# Patient Record
Sex: Female | Born: 1985 | Race: White | Hispanic: No | Marital: Single
Health system: Northeastern US, Academic
[De-identification: ages and names within clinical notes are randomized; demographics above are authoritative.]

## PROBLEM LIST (undated history)

## (undated) DIAGNOSIS — G5600 Carpal tunnel syndrome, unspecified upper limb: Secondary | ICD-10-CM

## (undated) DIAGNOSIS — J45909 Unspecified asthma, uncomplicated: Secondary | ICD-10-CM

## (undated) DIAGNOSIS — E039 Hypothyroidism, unspecified: Secondary | ICD-10-CM

## (undated) DIAGNOSIS — I7300 Raynaud's syndrome without gangrene: Secondary | ICD-10-CM

## (undated) HISTORY — DX: Raynaud's syndrome without gangrene: I73.00

## (undated) HISTORY — DX: Carpal tunnel syndrome, unspecified upper limb: G56.00

## (undated) HISTORY — DX: Hypothyroidism, unspecified: E03.9

## (undated) HISTORY — DX: Unspecified asthma, uncomplicated: J45.909

## (undated) HISTORY — PX: WISDOM TOOTH EXTRACTION: SHX21

---
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
## 2017-09-30 HISTORY — PX: ANTERIOR CRUCIATE LIGAMENT REPAIR: SHX115

## 2018-02-17 ENCOUNTER — Ambulatory Visit: Admit: 2018-02-17 | Payer: HMO

## 2018-02-17 ENCOUNTER — Ambulatory Visit: Admitting: Dermatology

## 2018-02-17 ENCOUNTER — Ambulatory Visit

## 2018-02-17 ENCOUNTER — Ambulatory Visit (HOSPITAL_BASED_OUTPATIENT_CLINIC_OR_DEPARTMENT_OTHER): Admitting: Psychiatry

## 2018-02-17 MED ORDER — Alclometasone Dipropionate: 0.05 | gms | 0 refills | 0 days | Status: AC

## 2018-02-17 NOTE — Progress Notes (Signed)
**Progress Notes**    ---    **Patient:** Sandy Martin, Sandy Martin     **Account Number:** [id number] **External MRN:** [id number]   **Provider:** Laddie Aquas, MD     **DOB:** October 03, 1985 **Age:** 40 Y **Sex:** Female   **Date:** 02/17/2018     **Phone:** [phone]   **CHN#:** [id number]     **Address:** [address], [address], [place], [id number]     **Pcp:** Leonarda Salon, MD        * * *        **Subjective:**        ---       **Chief Complaints:**    --- ---       1\. RASH ON CHEEK. 2. Evaluation of dermatologic issues.    --- ---      **HPI:**    _GENERAL_ :    Patient presents for evaluation and management of the following.    New patient.    ----    Concerns today:    1\. Rash    Location: R cheek only    Duration: ~3.5 months    Symptoms: not itchy or painful but bumpy    Current Treatments: none    Previous Treatments: none    Other:    - Seasonal allergies - takes antihistamines daily    2\. Also here for dark line appearing in R first digit    ----    Family history:    skin cancer - negative    atopic dermatitis - negative    ----    Social history: non smoker    ----    ROS: No other skin concerns. Patient feels well, denies fevers or chills.    --- ---       **Medical History:**        --- ---      **Medications:** Taking Allegra , Taking birth control pills , Taking  Levothroid (levothyroxine) , Medication List reviewed and reconciled with the  patient    --- ---       **Allergies:** N.K.D.A.    --- ---         **Objective:**        ---       **Vitals:** Pain scale 0.    --- ---         **Assessment:**        ---       **Assessment:**    1\. Dermatitis - L30.9 (Primary)    2\. 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 All findings  were negative in the physical exam above  other than what is noted below. Pertinent exam findings include (see embedded  into assessment below):    *****    ASSESSMENT:    1\. Dermatisis- right cheek with few erythematous papules coalescing into  plaques    - The pathophysiology of contact dermatitis was discussed with the patient,  including the complicated overlay between allergic contact dermatitis,  irritant contact dermatitis, atopic dermatitis, other skin conditions, and  environmental factors.    - Discussed patch testing process. All questions answered    - Patient deferred patch testing for now, instead will start topical steroids    -  Alclometasone 0.05% cream once to twice daily for 5 days a week. Stop when  clear and restarting as needed. For severe cases may use twice daily for 2  weeks straight. Side effects discussed with patient: skin thinning,  discoloration, rare systemic absorption.    2\. 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 Treatment options  (risks and benefits) discussed.    - Benign reassued, RTC sooner if anything changes    I, Megan McDonald, attest that this documentation has been prepared under the  direction and in the presence of Sheran Luz, MD, Electronically signed: Hardie Lora, Scribe.    I personally performed the services described in this documentation. All  medical record entries made by the scribe were at my direction and in my  presence. I have reviewed the chart and agree that the record reflects my  personal performance and is accurate and complete.    Sheran Luz, MD Attending Dermatologist.         **Plan:**        ---         **1\. Others**    Start Alclometasone Dipropionate Cream, 0.05 %, Apply to affected area once to  twice daily 5 days a week. Stop when clear. Restart as needed, Externally, as  directed, 14  days, 30 gms, Refills 0 .    ---      **Follow Up:** 2-3 months    --- ---    ---    ---          **Provider:** Laddie Aquas, MD    ---     **Patient:** Sandy Martin, Sandy Martin **DOB:** 10/31/1985 **Date:** 02/17/2018    ---    Electronically signed by Sheran Luz MD on 02/17/2018 at 05:02 PM EDT    Sign off status: Completed

## 2018-02-17 NOTE — Progress Notes (Signed)
.  Progress Notes  .  Patient: Sandy Martin  Provider: Laddie Aquas MD  .  DOB: 1986/08/13 Age: 32 Y Sex: Female  .  PCP: Leonarda Salon  MD  Date: 02/17/2018  .  --------------------------------------------------------------------------------  .  REASON FOR APPOINTMENT  .  1. RASH ON CHEEK. 2. Evaluation of dermatologic issues.  Marland Kitchen  HISTORY OF PRESENT ILLNESS  .  GENERAL:   Patient presents for evaluation and management of the following.  New patient. ----Concerns today:1. Rash Location: R cheek  onlyDuration:   3.5 months Symptoms: not itchy or painful but  bumpyCurrent Treatments: nonePrevious Treatments: none Other:-  Seasonal allergies - takes antihistamines daily 2. Also here for  dark line appearing in R first digit ----Family history:skin  cancer - negativeatopic dermatitis - negative----Social history:  non smoker ----ROS: No other skin concerns. Patient feels well,  denies fevers or chills.  .  CURRENT MEDICATIONS  .  Taking Allegra , Taking birth control pills , Taking Levothroid  (levothyroxine) , Medication List reviewed and reconciled with  the patient  .  PAST MEDICAL HISTORY  .  Medical History Verified.  .  ALLERGIES  .  N.K.D.A.  .  VITAL SIGNS  .  Pain scale 0.  .  ASSESSMENTS  .  Dermatitis - L30.9 (Primary)  .  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  PHYSICAL EXAMINATION:GENERAL: Well appearing in no acute  distressMOOD/AFFECT: Within normal limitsSKIN: A skin examination  was performed including head, face, forehead, eyelids, cheeks,  nose, lips, neck, bilateral upper extremities, hands, fingers,  fingernails. All findings were negative in the physical exam  above other than what is noted below. Pertinent exam findings  include (see embedded into assessment below):*****ASSESSMENT: 1.  Dermatisis- right cheek with few erythematous papules coalescing  into plaques- The pathophysiology of contact dermatitis was  discussed with the patient, including the complicated overlay  between allergic  contact dermatitis, irritant contact dermatitis,  atopic dermatitis, other skin conditions, and environmental  factors. - Discussed patch testing process. All questions  answered - Patient deferred patch testing for now, instead will  start topical steroids - Alclometasone 0.05% cream once to twice  daily for 5 days a week. Stop when clear and restarting as  needed. For severe cases may use twice daily for 2 weeks  straight. Side effects discussed with patient: skin thinning,  discoloration, rare systemic absorption. 2. Longitudinal  Melanonychia - R thumb 1mm linear light brown longitudinal strip,  horizontal ridging and inflammed cuticle on b/l thumbs- Counseled  on etiology and natural history of condition. Treatment options  (risks and benefits) discussed. - Benign reassued, RTC sooner if  anything changes I, Megan McDonald, attest that this  documentation has been prepared under the direction and in the  presence of Sheran Luz, MD, Electronically signed: Hardie Lora, Scribe. I personally performed the services described  in this documentation. All medical record entries made by the  scribe were at my direction and in my presence. I have reviewed  the chart and agree that the record reflects my personal  performance and is accurate and complete. Sheran Luz, MD  Attending Dermatologist.  .  TREATMENT  .  Others  Start Alclometasone Dipropionate Cream, 0.05 %, Apply to affected  area once to twice daily 5 days a week. Stop when clear. Restart  as needed, Externally, as directed, 14 days, 30 gms, Refills 0  .  FOLLOW UP  .  2-3 months  .  Electronically  signed by Sheran Luz MD on  02/17/2018 at 05:02 PM EDT  .  Document electronically signed by Laddie Aquas MD  .

## 2019-10-01 NOTE — L&D Delivery Note (Addendum)
OB/GYN Faculty Practice Delivery Note  Brooke Sherman is a 33 y.o. G1P0 s/p vaginal delivery at [redacted]w[redacted]d. She was admitted for IOL-.nonreactive NST.   ROM: 10h 41m with light meconium fluid GBS Status: Negative Maximum Maternal Temperature: 98.7 F  Labor Progress: . IOL using foley bulb, cytotec x2 and AROM. Patient received an epidural for pain management. Contractions adequate resulting in complete dilitation. Patient complete at 1105 and begin pushing with RNs. After one hour of pushing patient encouraged to labor down.   Delivery Date/Time: 09/19/2020 at 1528 Delivery: Called to room and patient was complete and pushing. Head delivered ROA. Nuchal cord present x1. Shoulder and body delivered in usual fashion. Infant with spontaneous cry, placed on mother's abdomen, dried and stimulated. Cord clamped x 2 after 1-minute delay, and cut by FOB. Cord blood drawn. Placenta delivered spontaneously, intact, with 3-vessel cord. Fundus firm with massage and Pitocin. Labia, perineum, vagina, and cervix inspected, 2nd degree and bilateral sulcus tears found (deep right and superficial left). Dr. Alysia Penna called to bedside for repair.   Placenta: spontaneous, intact on 09/19/20 at 1534 Complications: none Lacerations: 2nd & bilateral sulcus, 3.0 & 2.0 vicryl used EBL: 560 mL Analgesia: Epidural  Postpartum Planning [x]  transfer orders to MB [x]  discharge summary started & shared [x]  message to sent to schedule follow-up  [x]  lists updated [x]  vaccines UTD  Infant: boy  APGARs 9/9  3555 g  , SNM Student Nurse Midwife [facility] 09/19/2020, 4:53 PM  Attestation of Supervision of Student:  I confirm that I have verified the information documented in the nurse midwife student's note and that I have also personally performed the history, physical exam and all medical decision making activities.  I have verified that all services and findings are accurately  documented in this student's note; and I agree with management and plan as outlined in the documentation. I have also made any necessary editorial changes.  I was gloved at the bedside for the entirety of the pushing phase and delivery, assessed the perineum/tears and called Dr. in for assistance.  11/9, CNM [name], [facility] 09/19/2020 6:44 PM

## 2019-10-19 LAB — HM PAP SMEAR

## 2020-02-01 LAB — OB RESULTS CONSOLE GC/CHLAMYDIA
Chlamydia: NEGATIVE
Gonorrhea: NEGATIVE

## 2020-02-01 LAB — OB RESULTS CONSOLE ANTIBODY SCREEN
Antibody Screen: NEGATIVE
Antibody Screen: POSITIVE

## 2020-02-01 LAB — OB RESULTS CONSOLE RUBELLA ANTIBODY, IGM: Rubella: IMMUNE

## 2020-02-01 LAB — OB RESULTS CONSOLE HGB/HCT, BLOOD
HCT: 36 (ref 29–41)
Hemoglobin: 12

## 2020-02-01 LAB — HIV ANTIBODY (ROUTINE TESTING W REFLEX): HIV Screen 4th Generation wRfx: NONREACTIVE

## 2020-02-01 LAB — OB RESULTS CONSOLE PLATELET COUNT: Platelets: 236

## 2020-02-01 LAB — OB RESULTS CONSOLE TSH: TSH: 1.87

## 2020-02-01 LAB — OB RESULTS CONSOLE VARICELLA ZOSTER ANTIBODY, IGG: Varicella: IMMUNE

## 2020-02-01 LAB — HEPATITIS C ANTIBODY: HCV Ab: NEGATIVE

## 2020-02-01 LAB — OB RESULTS CONSOLE ABO/RH: RH Type: POSITIVE

## 2020-02-01 LAB — OB RESULTS CONSOLE HEPATITIS B SURFACE ANTIGEN: Hepatitis B Surface Ag: NEGATIVE

## 2020-02-01 LAB — OB RESULTS CONSOLE RPR: RPR: NONREACTIVE

## 2020-03-27 DIAGNOSIS — Z3400 Encounter for supervision of normal first pregnancy, unspecified trimester: Secondary | ICD-10-CM | POA: Insufficient documentation

## 2020-05-14 DIAGNOSIS — E039 Hypothyroidism, unspecified: Secondary | ICD-10-CM | POA: Insufficient documentation

## 2020-05-15 ENCOUNTER — Other Ambulatory Visit: Payer: Self-pay

## 2020-05-15 ENCOUNTER — Encounter: Payer: Self-pay | Admitting: Obstetrics and Gynecology

## 2020-05-15 ENCOUNTER — Ambulatory Visit (INDEPENDENT_AMBULATORY_CARE_PROVIDER_SITE_OTHER): Payer: Self-pay | Admitting: Obstetrics and Gynecology

## 2020-05-15 DIAGNOSIS — O99280 Endocrine, nutritional and metabolic diseases complicating pregnancy, unspecified trimester: Secondary | ICD-10-CM

## 2020-05-15 DIAGNOSIS — E039 Hypothyroidism, unspecified: Secondary | ICD-10-CM

## 2020-05-15 DIAGNOSIS — Z3402 Encounter for supervision of normal first pregnancy, second trimester: Secondary | ICD-10-CM

## 2020-05-15 MED ORDER — BLOOD PRESSURE MONITOR KIT: 1.0000 | PACK | 0 refills | Status: DC

## 2020-05-15 MED ORDER — GOJJI WEIGHT SCALE MISC: 0 refills | Status: DC

## 2020-05-15 MED ORDER — LEVOTHYROXINE SODIUM 75 MCG PO TABS: 75.0000 ug | ORAL_TABLET | Freq: Every day | ORAL | 3 refills | Status: DC

## 2020-05-15 NOTE — Progress Notes (Signed)
   Subjective:    Brooke Sherman is a G1P0 [redacted]w[redacted]d being seen today for her first obstetrical visit. Patient is transferring care from [place]. Records have been requested. Her obstetrical history is significant for hypothyroidism. Patient does intend to breast feed. Pregnancy history fully reviewed.  Patient reports no complaints.  Vitals:   05/15/20 0914  BP: 123/78  Pulse: 74  Weight: 169 lb (76.7 kg)    HISTORY: OB History  Gravida Para Term Preterm AB Living  1            SAB TAB Ectopic Multiple Live Births               # Outcome Date GA Lbr Len/2nd Weight Sex Delivery Anes PTL Lv  1 Current            Past Medical History:  Diagnosis Date  . Hypothyroid    History reviewed. No pertinent surgical history. History reviewed. No pertinent family history.   Exam        Assessment:    Pregnancy: G1P0 Patient Active Problem List   Diagnosis Date Noted  . Hypothyroidism affecting pregnancy 05/14/2020  . Encounter for supervision of normal first pregnancy in second trimester 03/27/2020        Plan:     Prenatal vitamins. Problem list reviewed and updated. Genetic Screening discussed : Panorama previously done- records requested. AFP today.  Ultrasound discussed; fetal survey: ordered.  Follow up in 4 weeks for third trimester labs. 50% of 30 min visit spent on counseling and coordination of care.     Brooke Sherman 05/15/2020

## 2020-05-15 NOTE — Patient Instructions (Signed)
 Second Trimester of Pregnancy The second trimester is from week 14 through week 27 (months 4 through 6). The second trimester is often a time when you feel your best. Your body has adjusted to being pregnant, and you begin to feel better physically. Usually, morning sickness has lessened or quit completely, you may have more energy, and you may have an increase in appetite. The second trimester is also a time when the fetus is growing rapidly. At the end of the sixth month, the fetus is about 9 inches long and weighs about 1 pounds. You will likely begin to feel the baby move (quickening) between 16 and 20 weeks of pregnancy. Body changes during your second trimester Your body continues to go through many changes during your second trimester. The changes vary from woman to woman.  Your weight will continue to increase. You will notice your lower abdomen bulging out.  You may begin to get stretch marks on your hips, abdomen, and breasts.  You may develop headaches that can be relieved by medicines. The medicines should be approved by your health care provider.  You may urinate more often because the fetus is pressing on your bladder.  You may develop or continue to have heartburn as a result of your pregnancy.  You may develop constipation because certain hormones are causing the muscles that push waste through your intestines to slow down.  You may develop hemorrhoids or swollen, bulging veins (varicose veins).  You may have back pain. This is caused by: ? Weight gain. ? Pregnancy hormones that are relaxing the joints in your pelvis. ? A shift in weight and the muscles that support your balance.  Your breasts will continue to grow and they will continue to become tender.  Your gums may bleed and may be sensitive to brushing and flossing.  Dark spots or blotches (chloasma, mask of pregnancy) may develop on your face. This will likely fade after the baby is born.  A dark line from  your belly button to the pubic area (linea nigra) may appear. This will likely fade after the baby is born.  You may have changes in your hair. These can include thickening of your hair, rapid growth, and changes in texture. Some women also have hair loss during or after pregnancy, or hair that feels dry or thin. Your hair will most likely return to normal after your baby is born. What to expect at prenatal visits During a routine prenatal visit:  You will be weighed to make sure you and the fetus are growing normally.  Your blood pressure will be taken.  Your abdomen will be measured to track your baby's growth.  The fetal heartbeat will be listened to.  Any test results from the previous visit will be discussed. Your health care provider may ask you:  How you are feeling.  If you are feeling the baby move.  If you have had any abnormal symptoms, such as leaking fluid, bleeding, severe headaches, or abdominal cramping.  If you are using any tobacco products, including cigarettes, chewing tobacco, and electronic cigarettes.  If you have any questions. Other tests that may be performed during your second trimester include:  Blood tests that check for: ? Low iron levels (anemia). ? High blood sugar that affects pregnant women (gestational diabetes) between 24 and 28 weeks. ? Rh antibodies. This is to check for a protein on red blood cells (Rh factor).  Urine tests to check for infections, diabetes, or protein in   the urine.  An ultrasound to confirm the proper growth and development of the baby.  An amniocentesis to check for possible genetic problems.  Fetal screens for spina bifida and Down syndrome.  HIV (human immunodeficiency virus) testing. Routine prenatal testing includes screening for HIV, unless you choose not to have this test. Follow these instructions at home: Medicines  Follow your health care provider's instructions regarding medicine use. Specific medicines  may be either safe or unsafe to take during pregnancy.  Take a prenatal vitamin that contains at least 600 micrograms (mcg) of folic acid.  If you develop constipation, try taking a stool softener if your health care provider approves. Eating and drinking   Eat a balanced diet that includes fresh fruits and vegetables, whole grains, good sources of protein such as meat, eggs, or tofu, and low-fat dairy. Your health care provider will help you determine the amount of weight gain that is right for you.  Avoid raw meat and uncooked cheese. These carry germs that can cause birth defects in the baby.  If you have low calcium intake from food, talk to your health care provider about whether you should take a daily calcium supplement.  Limit foods that are high in fat and processed sugars, such as fried and sweet foods.  To prevent constipation: ? Drink enough fluid to keep your urine clear or pale yellow. ? Eat foods that are high in fiber, such as fresh fruits and vegetables, whole grains, and beans. Activity  Exercise only as directed by your health care provider. Most women can continue their usual exercise routine during pregnancy. Try to exercise for 30 minutes at least 5 days a week. Stop exercising if you experience uterine contractions.  Avoid heavy lifting, wear low heel shoes, and practice good posture.  A sexual relationship may be continued unless your health care provider directs you otherwise. Relieving pain and discomfort  Wear a good support bra to prevent discomfort from breast tenderness.  Take warm sitz baths to soothe any pain or discomfort caused by hemorrhoids. Use hemorrhoid cream if your health care provider approves.  Rest with your legs elevated if you have leg cramps or low back pain.  If you develop varicose veins, wear support hose. Elevate your feet for 15 minutes, 3-4 times a day. Limit salt in your diet. Prenatal Care  Write down your questions. Take  them to your prenatal visits.  Keep all your prenatal visits as told by your health care provider. This is important. Safety  Wear your seat belt at all times when driving.  Make a list of emergency phone numbers, including numbers for family, friends, the hospital, and police and fire departments. General instructions  Ask your health care provider for a referral to a local prenatal education class. Begin classes no later than the beginning of month 6 of your pregnancy.  Ask for help if you have counseling or nutritional needs during pregnancy. Your health care provider can offer advice or refer you to specialists for help with various needs.  Do not use hot tubs, steam rooms, or saunas.  Do not douche or use tampons or scented sanitary pads.  Do not cross your legs for long periods of time.  Avoid cat litter boxes and soil used by cats. These carry germs that can cause birth defects in the baby and possibly loss of the fetus by miscarriage or stillbirth.  Avoid all smoking, herbs, alcohol, and unprescribed drugs. Chemicals in these products can affect the   formation and growth of the baby.  Do not use any products that contain nicotine or tobacco, such as cigarettes and e-cigarettes. If you need help quitting, ask your health care provider.  Visit your dentist if you have not gone yet during your pregnancy. Use a soft toothbrush to brush your teeth and be gentle when you floss. Contact a health care provider if:  You have dizziness.  You have mild pelvic cramps, pelvic pressure, or nagging pain in the abdominal area.  You have persistent nausea, vomiting, or diarrhea.  You have a bad smelling vaginal discharge.  You have pain when you urinate. Get help right away if:  You have a fever.  You are leaking fluid from your vagina.  You have spotting or bleeding from your vagina.  You have severe abdominal cramping or pain.  You have rapid weight gain or weight loss.  You  have shortness of breath with chest pain.  You notice sudden or extreme swelling of your face, hands, ankles, feet, or legs.  You have not felt your baby move in over an hour.  You have severe headaches that do not go away when you take medicine.  You have vision changes. Summary  The second trimester is from week 14 through week 27 (months 4 through 6). It is also a time when the fetus is growing rapidly.  Your body goes through many changes during pregnancy. The changes vary from woman to woman.  Avoid all smoking, herbs, alcohol, and unprescribed drugs. These chemicals affect the formation and growth your baby.  Do not use any tobacco products, such as cigarettes, chewing tobacco, and e-cigarettes. If you need help quitting, ask your health care provider.  Contact your health care provider if you have any questions. Keep all prenatal visits as told by your health care provider. This is important. This information is not intended to replace advice given to you by your health care provider. Make sure you discuss any questions you have with your health care provider. Document Revised: 01/08/2019 Document Reviewed: 10/22/2016 [employer] Patient Education  2020 [employer]   Contraception Choices Contraception, also called birth control, refers to methods or devices that prevent pregnancy. Hormonal methods Contraceptive implant  A contraceptive implant is a thin, plastic tube that contains a hormone. It is inserted into the upper part of the arm. It can remain in place for up to 3 years. Progestin-only injections Progestin-only injections are injections of progestin, a synthetic form of the hormone progesterone. They are given every 3 months by a health care provider. Birth control pills  Birth control pills are pills that contain hormones that prevent pregnancy. They must be taken once a day, preferably at the same time each day. Birth control patch  The birth control patch  contains hormones that prevent pregnancy. It is placed on the skin and must be changed once a week for three weeks and removed on the fourth week. A prescription is needed to use this method of contraception. Vaginal ring  A vaginal ring contains hormones that prevent pregnancy. It is placed in the vagina for three weeks and removed on the fourth week. After that, the process is repeated with a new ring. A prescription is needed to use this method of contraception. Emergency contraceptive Emergency contraceptives prevent pregnancy after unprotected sex. They come in pill form and can be taken up to 5 days after sex. They work best the sooner they are taken after having sex. Most emergency contraceptives are available   without a prescription. This method should not be used as your only form of birth control. Barrier methods Female condom  A female condom is a thin sheath that is worn over the penis during sex. Condoms keep sperm from going inside a woman's body. They can be used with a spermicide to increase their effectiveness. They should be disposed after a single use. Female condom  A female condom is a soft, loose-fitting sheath that is put into the vagina before sex. The condom keeps sperm from going inside a woman's body. They should be disposed after a single use. Diaphragm  A diaphragm is a soft, dome-shaped barrier. It is inserted into the vagina before sex, along with a spermicide. The diaphragm blocks sperm from entering the uterus, and the spermicide kills sperm. A diaphragm should be left in the vagina for 6-8 hours after sex and removed within 24 hours. A diaphragm is prescribed and fitted by a health care provider. A diaphragm should be replaced every 1-2 years, after giving birth, after gaining more than 15 lb (6.8 kg), and after pelvic surgery. Cervical cap  A cervical cap is a round, soft latex or plastic cup that fits over the cervix. It is inserted into the vagina before sex, along  with spermicide. It blocks sperm from entering the uterus. The cap should be left in place for 6-8 hours after sex and removed within 48 hours. A cervical cap must be prescribed and fitted by a health care provider. It should be replaced every 2 years. Sponge  A sponge is a soft, circular piece of polyurethane foam with spermicide on it. The sponge helps block sperm from entering the uterus, and the spermicide kills sperm. To use it, you make it wet and then insert it into the vagina. It should be inserted before sex, left in for at least 6 hours after sex, and removed and thrown away within 30 hours. Spermicides Spermicides are chemicals that kill or block sperm from entering the cervix and uterus. They can come as a cream, jelly, suppository, foam, or tablet. A spermicide should be inserted into the vagina with an applicator at least 10-15 minutes before sex to allow time for it to work. The process must be repeated every time you have sex. Spermicides do not require a prescription. Intrauterine contraception Intrauterine device (IUD) An IUD is a T-shaped device that is put in a woman's uterus. There are two types:  Hormone IUD.This type contains progestin, a synthetic form of the hormone progesterone. This type can stay in place for 3-5 years.  Copper IUD.This type is wrapped in copper wire. It can stay in place for 10 years.  Permanent methods of contraception Female tubal ligation In this method, a woman's fallopian tubes are sealed, tied, or blocked during surgery to prevent eggs from traveling to the uterus. Hysteroscopic sterilization In this method, a small, flexible insert is placed into each fallopian tube. The inserts cause scar tissue to form in the fallopian tubes and block them, so sperm cannot reach an egg. The procedure takes about 3 months to be effective. Another form of birth control must be used during those 3 months. Female sterilization This is a procedure to tie off the  tubes that carry sperm (vasectomy). After the procedure, the man can still ejaculate fluid (semen). Natural planning methods Natural family planning In this method, a couple does not have sex on days when the woman could become pregnant. Calendar method This means keeping track of the length   of each menstrual cycle, identifying the days when pregnancy can happen, and not having sex on those days. Ovulation method In this method, a couple avoids sex during ovulation. Symptothermal method This method involves not having sex during ovulation. The woman typically checks for ovulation by watching changes in her temperature and in the consistency of cervical mucus. Post-ovulation method In this method, a couple waits to have sex until after ovulation. Summary  Contraception, also called birth control, means methods or devices that prevent pregnancy.  Hormonal methods of contraception include implants, injections, pills, patches, vaginal rings, and emergency contraceptives.  Barrier methods of contraception can include female condoms, female condoms, diaphragms, cervical caps, sponges, and spermicides.  There are two types of IUDs (intrauterine devices). An IUD can be put in a woman's uterus to prevent pregnancy for 3-5 years.  Permanent sterilization can be done through a procedure for males, females, or both.  Natural family planning methods involve not having sex on days when the woman could become pregnant. This information is not intended to replace advice given to you by your health care provider. Make sure you discuss any questions you have with your health care provider. Document Revised: 09/18/2017 Document Reviewed: 10/19/2016 [employer] Patient Education  2020 [employer]   Breastfeeding  Choosing to breastfeed is one of the best decisions you can make for yourself and your baby. A change in hormones during pregnancy causes your breasts to make breast milk in your milk-producing  glands. Hormones prevent breast milk from being released before your baby is born. They also prompt milk flow after birth. Once breastfeeding has begun, thoughts of your baby, as well as his or her sucking or crying, can stimulate the release of milk from your milk-producing glands. Benefits of breastfeeding Research shows that breastfeeding offers many health benefits for infants and mothers. It also offers a cost-free and convenient way to feed your baby. For your baby  Your first milk (colostrum) helps your baby's digestive system to function better.  Special cells in your milk (antibodies) help your baby to fight off infections.  Breastfed babies are less likely to develop asthma, allergies, obesity, or type 2 diabetes. They are also at lower risk for sudden infant death syndrome (SIDS).  Nutrients in breast milk are better able to meet your baby's needs compared to infant formula.  Breast milk improves your baby's brain development. For you  Breastfeeding helps to create a very special bond between you and your baby.  Breastfeeding is convenient. Breast milk costs nothing and is always available at the correct temperature.  Breastfeeding helps to burn calories. It helps you to lose the weight that you gained during pregnancy.  Breastfeeding makes your uterus return faster to its size before pregnancy. It also slows bleeding (lochia) after you give birth.  Breastfeeding helps to lower your risk of developing type 2 diabetes, osteoporosis, rheumatoid arthritis, cardiovascular disease, and breast, ovarian, uterine, and endometrial cancer later in life. Breastfeeding basics Starting breastfeeding  Find a comfortable place to sit or lie down, with your neck and back well-supported.  Place a pillow or a rolled-up blanket under your baby to bring him or her to the level of your breast (if you are seated). Nursing pillows are specially designed to help support your arms and your baby while  you breastfeed.  Make sure that your baby's tummy (abdomen) is facing your abdomen.  Gently massage your breast. With your fingertips, massage from the outer edges of your breast inward toward   the nipple. This encourages milk flow. If your milk flows slowly, you may need to continue this action during the feeding.  Support your breast with 4 fingers underneath and your thumb above your nipple (make the letter "C" with your hand). Make sure your fingers are well away from your nipple and your baby's mouth.  Stroke your baby's lips gently with your finger or nipple.  When your baby's mouth is open wide enough, quickly bring your baby to your breast, placing your entire nipple and as much of the areola as possible into your baby's mouth. The areola is the colored area around your nipple. ? More areola should be visible above your baby's upper lip than below the lower lip. ? Your baby's lips should be opened and extended outward (flanged) to ensure an adequate, comfortable latch. ? Your baby's tongue should be between his or her lower gum and your breast.  Make sure that your baby's mouth is correctly positioned around your nipple (latched). Your baby's lips should create a seal on your breast and be turned out (everted).  It is common for your baby to suck about 2-3 minutes in order to start the flow of breast milk. Latching Teaching your baby how to latch onto your breast properly is very important. An improper latch can cause nipple pain, decreased milk supply, and poor weight gain in your baby. Also, if your baby is not latched onto your nipple properly, he or she may swallow some air during feeding. This can make your baby fussy. Burping your baby when you switch breasts during the feeding can help to get rid of the air. However, teaching your baby to latch on properly is still the best way to prevent fussiness from swallowing air while breastfeeding. Signs that your baby has successfully  latched onto your nipple  Silent tugging or silent sucking, without causing you pain. Infant's lips should be extended outward (flanged).  Swallowing heard between every 3-4 sucks once your milk has started to flow (after your let-down milk reflex occurs).  Muscle movement above and in front of his or her ears while sucking. Signs that your baby has not successfully latched onto your nipple  Sucking sounds or smacking sounds from your baby while breastfeeding.  Nipple pain. If you think your baby has not latched on correctly, slip your finger into the corner of your baby's mouth to break the suction and place it between your baby's gums. Attempt to start breastfeeding again. Signs of successful breastfeeding Signs from your baby  Your baby will gradually decrease the number of sucks or will completely stop sucking.  Your baby will fall asleep.  Your baby's body will relax.  Your baby will retain a small amount of milk in his or her mouth.  Your baby will let go of your breast by himself or herself. Signs from you  Breasts that have increased in firmness, weight, and size 1-3 hours after feeding.  Breasts that are softer immediately after breastfeeding.  Increased milk volume, as well as a change in milk consistency and color by the fifth day of breastfeeding.  Nipples that are not sore, cracked, or bleeding. Signs that your baby is getting enough milk  Wetting at least 1-2 diapers during the first 24 hours after birth.  Wetting at least 5-6 diapers every 24 hours for the first week after birth. The urine should be clear or pale yellow by the age of 5 days.  Wetting 6-8 diapers every 24 hours as   your baby continues to grow and develop.  At least 3 stools in a 24-hour period by the age of 5 days. The stool should be soft and yellow.  At least 3 stools in a 24-hour period by the age of 7 days. The stool should be seedy and yellow.  No loss of weight greater than 10% of  birth weight during the first 3 days of life.  Average weight gain of 4-7 oz (113-198 g) per week after the age of 4 days.  Consistent daily weight gain by the age of 5 days, without weight loss after the age of 2 weeks. After a feeding, your baby may spit up a small amount of milk. This is normal. Breastfeeding frequency and duration Frequent feeding will help you make more milk and can prevent sore nipples and extremely full breasts (breast engorgement). Breastfeed when you feel the need to reduce the fullness of your breasts or when your baby shows signs of hunger. This is called "breastfeeding on demand." Signs that your baby is hungry include:  Increased alertness, activity, or restlessness.  Movement of the head from side to side.  Opening of the mouth when the corner of the mouth or cheek is stroked (rooting).  Increased sucking sounds, smacking lips, cooing, sighing, or squeaking.  Hand-to-mouth movements and sucking on fingers or hands.  Fussing or crying. Avoid introducing a pacifier to your baby in the first 4-6 weeks after your baby is born. After this time, you may choose to use a pacifier. Research has shown that pacifier use during the first year of a baby's life decreases the risk of sudden infant death syndrome (SIDS). Allow your baby to feed on each breast as long as he or she wants. When your baby unlatches or falls asleep while feeding from the first breast, offer the second breast. Because newborns are often sleepy in the first few weeks of life, you may need to awaken your baby to get him or her to feed. Breastfeeding times will vary from baby to baby. However, the following rules can serve as a guide to help you make sure that your baby is properly fed:  Newborns (babies 4 weeks of age or younger) may breastfeed every 1-3 hours.  Newborns should not go without breastfeeding for longer than 3 hours during the day or 5 hours during the night.  You should breastfeed  your baby a minimum of 8 times in a 24-hour period. Breast milk pumping     Pumping and storing breast milk allows you to make sure that your baby is exclusively fed your breast milk, even at times when you are unable to breastfeed. This is especially important if you go back to work while you are still breastfeeding, or if you are not able to be present during feedings. Your lactation consultant can help you find a method of pumping that works best for you and give you guidelines about how long it is safe to store breast milk. Caring for your breasts while you breastfeed Nipples can become dry, cracked, and sore while breastfeeding. The following recommendations can help keep your breasts moisturized and healthy:  Avoid using soap on your nipples.  Wear a supportive bra designed especially for nursing. Avoid wearing underwire-style bras or extremely tight bras (sports bras).  Air-dry your nipples for 3-4 minutes after each feeding.  Use only cotton bra pads to absorb leaked breast milk. Leaking of breast milk between feedings is normal.  Use lanolin on your nipples   after breastfeeding. Lanolin helps to maintain your skin's normal moisture barrier. Pure lanolin is not harmful (not toxic) to your baby. You may also hand express a few drops of breast milk and gently massage that milk into your nipples and allow the milk to air-dry. In the first few weeks after giving birth, some women experience breast engorgement. Engorgement can make your breasts feel heavy, warm, and tender to the touch. Engorgement peaks within 3-5 days after you give birth. The following recommendations can help to ease engorgement:  Completely empty your breasts while breastfeeding or pumping. You may want to start by applying warm, moist heat (in the shower or with warm, water-soaked hand towels) just before feeding or pumping. This increases circulation and helps the milk flow. If your baby does not completely empty your  breasts while breastfeeding, pump any extra milk after he or she is finished.  Apply ice packs to your breasts immediately after breastfeeding or pumping, unless this is too uncomfortable for you. To do this: ? Put ice in a plastic bag. ? Place a towel between your skin and the bag. ? Leave the ice on for 20 minutes, 2-3 times a day.  Make sure that your baby is latched on and positioned properly while breastfeeding. If engorgement persists after 48 hours of following these recommendations, contact your health care provider or a lactation consultant. Overall health care recommendations while breastfeeding  Eat 3 healthy meals and 3 snacks every day. Well-nourished mothers who are breastfeeding need an additional 450-500 calories a day. You can meet this requirement by increasing the amount of a balanced diet that you eat.  Drink enough water to keep your urine pale yellow or clear.  Rest often, relax, and continue to take your prenatal vitamins to prevent fatigue, stress, and low vitamin and mineral levels in your body (nutrient deficiencies).  Do not use any products that contain nicotine or tobacco, such as cigarettes and e-cigarettes. Your baby may be harmed by chemicals from cigarettes that pass into breast milk and exposure to secondhand smoke. If you need help quitting, ask your health care provider.  Avoid alcohol.  Do not use illegal drugs or marijuana.  Talk with your health care provider before taking any medicines. These include over-the-counter and prescription medicines as well as vitamins and herbal supplements. Some medicines that may be harmful to your baby can pass through breast milk.  It is possible to become pregnant while breastfeeding. If birth control is desired, ask your health care provider about options that will be safe while breastfeeding your baby. Where to find more information: [employer]: www.llli.org Contact a health care provider  if:  You feel like you want to stop breastfeeding or have become frustrated with breastfeeding.  Your nipples are cracked or bleeding.  Your breasts are red, tender, or warm.  You have: ? Painful breasts or nipples. ? A swollen area on either breast. ? A fever or chills. ? Nausea or vomiting. ? Drainage other than breast milk from your nipples.  Your breasts do not become full before feedings by the fifth day after you give birth.  You feel sad and depressed.  Your baby is: ? Too sleepy to eat well. ? Having trouble sleeping. ? More than 1 week old and wetting fewer than 6 diapers in a 24-hour period. ? Not gaining weight by 5 days of age.  Your baby has fewer than 3 stools in a 24-hour period.  Your baby's skin or   the white parts of his or her eyes become yellow. Get help right away if:  Your baby is overly tired (lethargic) and does not want to wake up and feed.  Your baby develops an unexplained fever. Summary  Breastfeeding offers many health benefits for infant and mothers.  Try to breastfeed your infant when he or she shows early signs of hunger.  Gently tickle or stroke your baby's lips with your finger or nipple to allow the baby to open his or her mouth. Bring the baby to your breast. Make sure that much of the areola is in your baby's mouth. Offer one side and burp the baby before you offer the other side.  Talk with your health care provider or lactation consultant if you have questions or you face problems as you breastfeed. This information is not intended to replace advice given to you by your health care provider. Make sure you discuss any questions you have with your health care provider. Document Revised: 12/11/2017 Document Reviewed: 10/18/2016 Elsevier Patient Education  2020 Elsevier Inc.  

## 2020-05-15 NOTE — Addendum Note (Signed)
Addended by: Kennon Portela on: 05/15/2020 01:17 PM   Modules accepted: Orders

## 2020-05-15 NOTE — Progress Notes (Signed)
Patient presents for NOB Visit today.  LMP:12/10/19  GC/CT done 02/01/20 Neg  Genetic Screening: Already had Panorama Done WNL per pt does not want to know gender.   Last pap:01/21 WNL   CC: Trouble Sleeping and pain in rib cage at night.  Needs refill on thyroid medication.

## 2020-05-17 LAB — AFP, SERUM, OPEN SPINA BIFIDA
AFP MoM: 0.93
AFP Value: 68.1 ng/mL
Gest. Age on Collection Date: 22.3 weeks
Maternal Age At EDD: 34 yr
OSBR Risk 1 IN: 10000
Test Results:: NEGATIVE
Weight: 169 [lb_av]

## 2020-05-17 LAB — CULTURE, OB URINE

## 2020-05-17 LAB — URINE CULTURE, OB REFLEX

## 2020-05-19 ENCOUNTER — Other Ambulatory Visit: Payer: Self-pay

## 2020-05-19 ENCOUNTER — Ambulatory Visit: Payer: 59 | Attending: Obstetrics and Gynecology

## 2020-05-19 ENCOUNTER — Other Ambulatory Visit: Payer: Self-pay | Admitting: Obstetrics and Gynecology

## 2020-05-19 DIAGNOSIS — Z363 Encounter for antenatal screening for malformations: Secondary | ICD-10-CM | POA: Diagnosis not present

## 2020-05-19 DIAGNOSIS — O99283 Endocrine, nutritional and metabolic diseases complicating pregnancy, third trimester: Secondary | ICD-10-CM | POA: Diagnosis not present

## 2020-05-19 DIAGNOSIS — Z3402 Encounter for supervision of normal first pregnancy, second trimester: Secondary | ICD-10-CM | POA: Insufficient documentation

## 2020-05-19 DIAGNOSIS — Z3A23 23 weeks gestation of pregnancy: Secondary | ICD-10-CM | POA: Diagnosis not present

## 2020-05-19 DIAGNOSIS — E039 Hypothyroidism, unspecified: Secondary | ICD-10-CM

## 2020-05-19 IMAGING — US US MFM OB DETAIL+14 WK
1 series · 13 of 28 positions shown · non-contrast
Comparison: none

[Series 1: us mfm ob detail+14 wk · 13 of 132 slices shown]
[im 5/132]
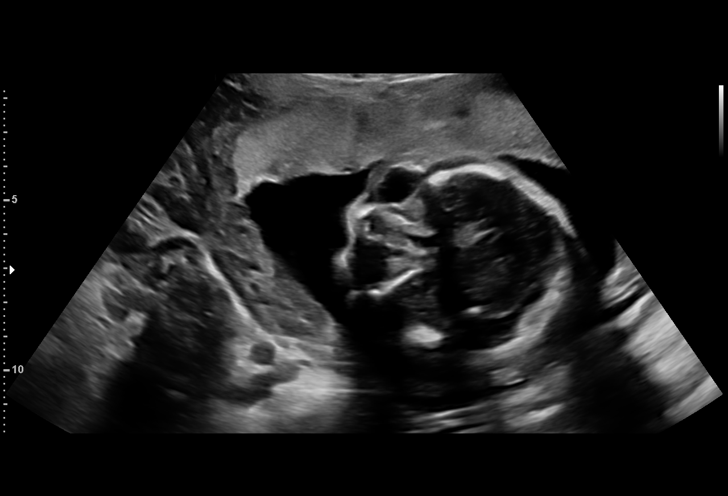
[im 15/132]
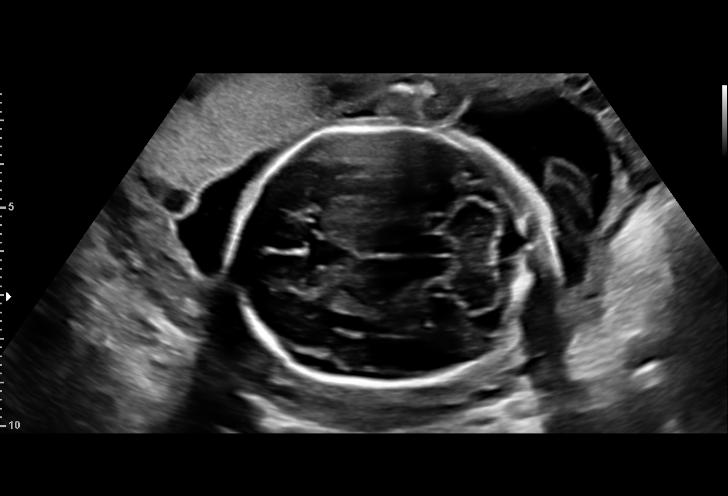
[im 25/132]
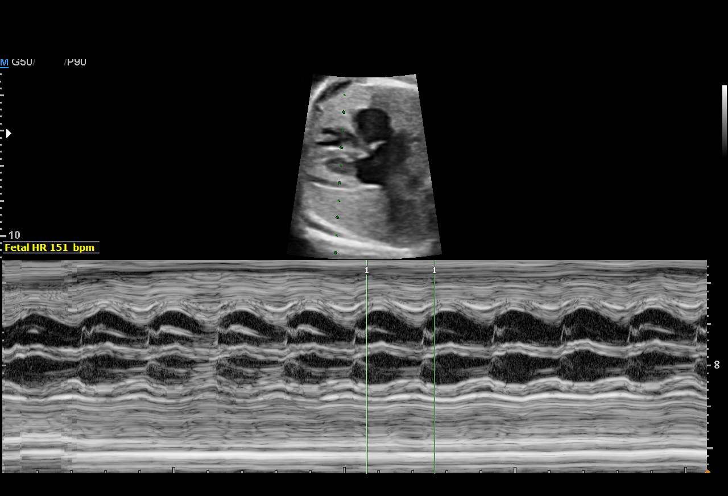
[im 34/132]
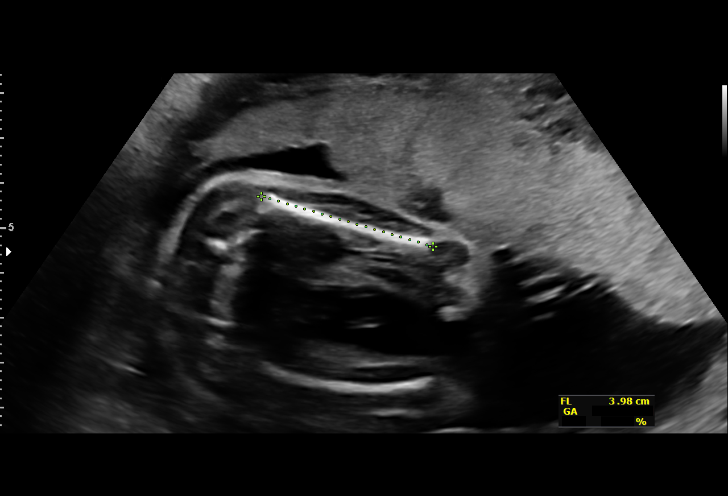
[im 44/132]
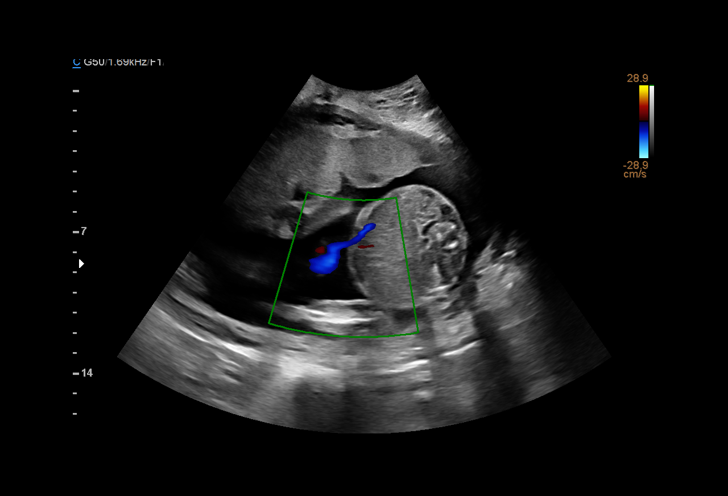
[im 54/132]
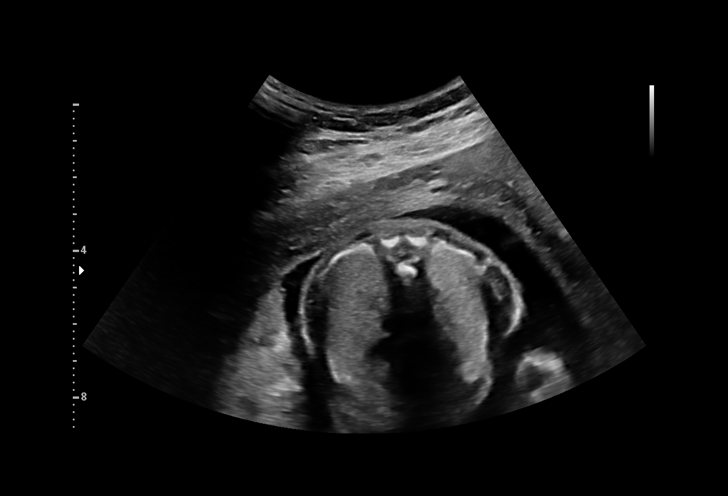
[im 68/132]
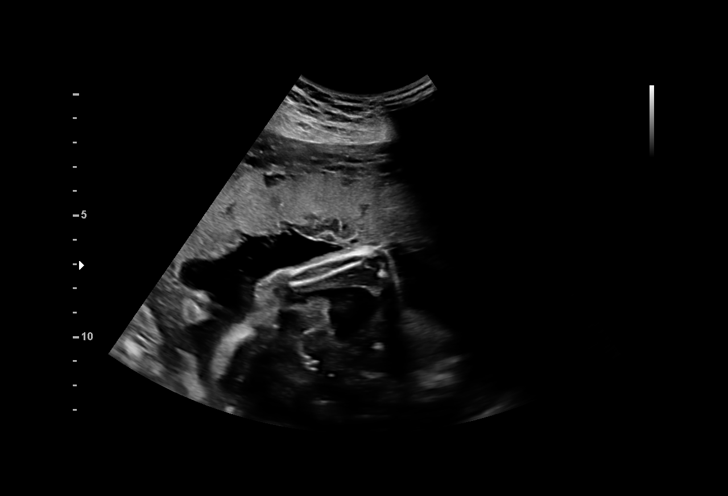
[im 78/132]
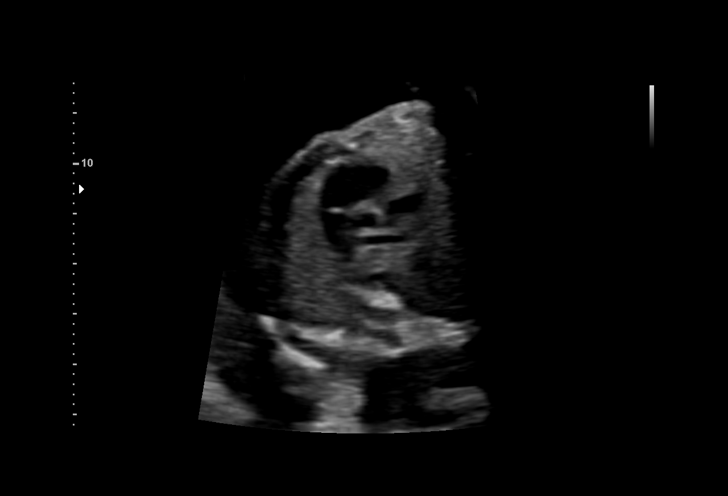
[im 88/132]
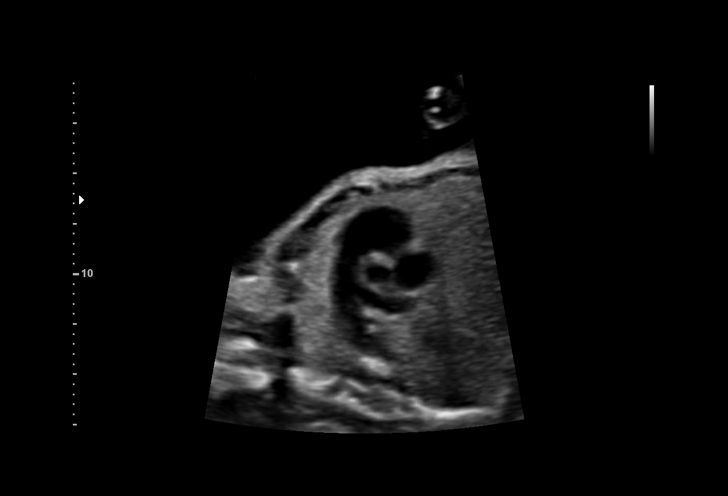
[im 98/132]
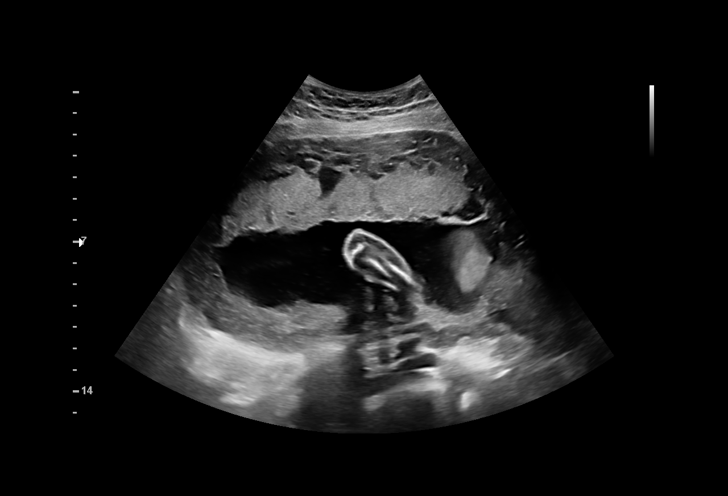
[im 107/132]
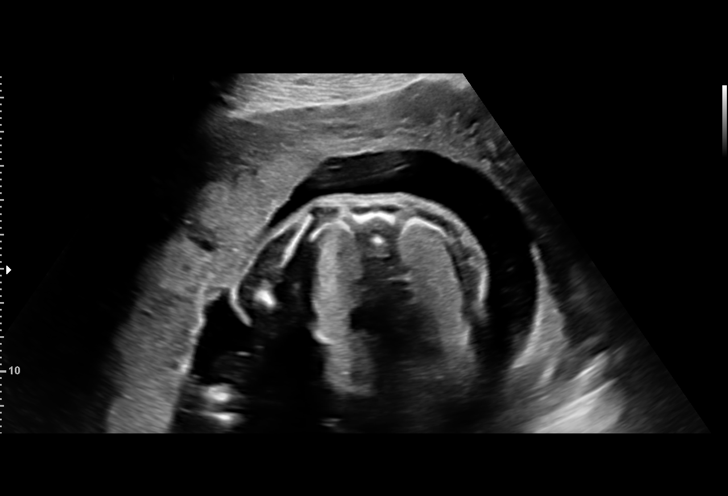
[im 117/132]
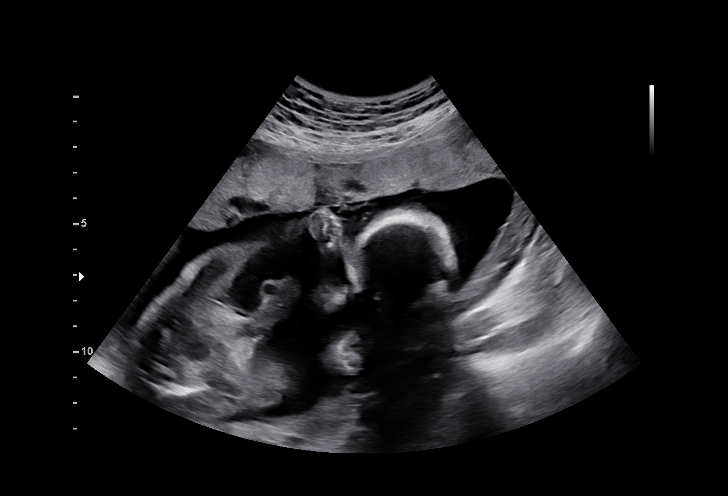
[im 127/132]
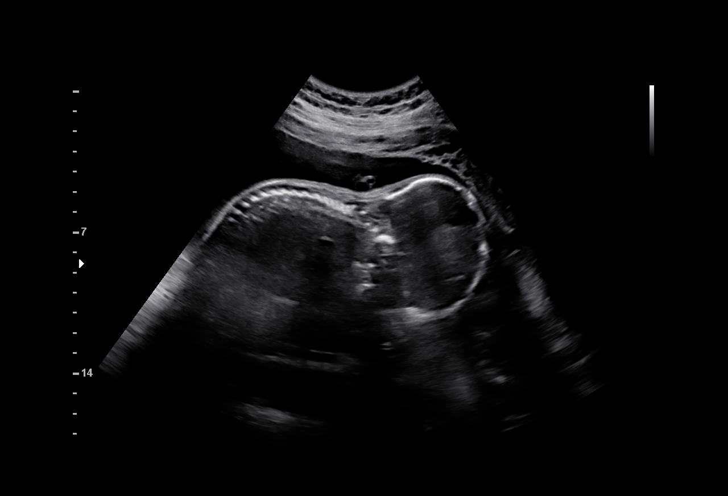

[13 of 28 positions shown; findings below may reference images not displayed]

HERSHBERGER

Indications

 Hypothyroid                                    [S8] [S8]
 LR NIPS (per pt), neg AFP
 Encounter for antenatal screening for          [S8]
 malformations
 23 weeks gestation of pregnancy
Fetal Evaluation

 Num Of Fetuses:         1
 Fetal Heart Rate(bpm):  151
 Cardiac Activity:       Observed
 Presentation:           Cephalic
 Placenta:               Anterior
 P. Cord Insertion:      Visualized

 Amniotic Fluid
 AFI FV:      Within normal limits

                             Largest Pocket(cm)

Biometry

 BPD:      58.7  mm     G. Age:  24w 0d         81  %    CI:        79.35   %    70 - 86
                                                         FL/HC:      20.0   %    19.2 -
 HC:      208.3  mm     G. Age:  22w 6d         32  %    HC/AC:      1.05        1.05 -
 AC:      198.3  mm     G. Age:  24w 3d         85  %    FL/BPD:     70.9   %    71 - 87
 FL:       41.6  mm     G. Age:  23w 4d         56  %    FL/AC:      21.0   %    20 - 24
 HUM:      38.6  mm     G. Age:  23w 5d         57  %
 CER:      27.8  mm     G. Age:  24w 5d     > 97.7  %
 LV:        3.7  mm
 CM:        5.6  mm

 Est. FW:     646  gm      1 lb 7 oz     86  %
OB History

 Gravidity:    1
Gestational Age

 LMP:           23w 0d        Date:  [DATE]                 EDD:   [DATE]
 U/S Today:     23w 5d                                        EDD:   [DATE]
 Best:          23w 0d     Det. By:  LMP  ([DATE])          EDD:   [DATE]
Anatomy

 Cranium:               Appears normal         LVOT:                   Appears normal
 Cavum:                 Appears normal         Aortic Arch:            Appears normal
 Ventricles:            Appears normal         Ductal Arch:            Appears normal
 Choroid Plexus:        Appears normal         Diaphragm:              Appears normal
 Cerebellum:            Appears normal         Stomach:                Appears normal, left
                                                                       sided
 Posterior Fossa:       Appears normal         Abdomen:                Appears normal
 Nuchal Fold:           Not applicable (>20    Abdominal Wall:         Appears nml (cord
                        wks GA)                                        insert, abd wall)
 Face:                  Appears normal         Cord Vessels:           Appears normal (3
                        (orbits and profile)                           vessel cord)
 Lips:                  Appears normal         Kidneys:                Appear normal
 Palate:                Appears normal         Bladder:                Appears normal
 Thoracic:              Appears normal         Spine:                  Appears normal
 Heart:                 Appears normal         Upper Extremities:      Appears normal
                        (4CH, axis, and
                        situs)
 RVOT:                  Appears normal         Lower Extremities:      Appears normal

 Other:  Parents do not wish to know sex of fetus. Heels visualized. Open
         hands visualized. Nasal bone visualized. Technically difficult due to
         fetal position.
Cervix Uterus Adnexa

 Cervix
 Length:            3.9  cm.
 Normal appearance by transabdominal scan.
Comments

 This patient was seen for a detailed fetal anatomy scan.  She
 reports a history of hypothyroidism.
 She denies any other significant past medical history and
 denies any problems in her current pregnancy.
 She reports that she had a cell free DNA test earlier in her
 pregnancy which indicated a low risk for trisomy 21, 18, and
 13.  The patient did not want the fetal gender revealed today.
 She was informed that the fetal growth and amniotic fluid
 level were appropriate for her gestational age.
 There were no obvious fetal anomalies noted on today's
 ultrasound exam.
 The patient was informed that anomalies may be missed due
 to technical limitations. If the fetus is in a suboptimal position
 or maternal habitus is increased, visualization of the fetus in
 the maternal uterus may be impaired.
 Due to maternal hypothyroidism, she should have another
 growth ultrasound scheduled in the third trimester.

## 2020-05-22 ENCOUNTER — Other Ambulatory Visit: Payer: Self-pay | Admitting: *Deleted

## 2020-05-22 DIAGNOSIS — E039 Hypothyroidism, unspecified: Secondary | ICD-10-CM

## 2020-05-27 ENCOUNTER — Other Ambulatory Visit: Payer: Self-pay

## 2020-05-27 ENCOUNTER — Encounter (HOSPITAL_COMMUNITY): Payer: Self-pay | Admitting: Family Medicine

## 2020-05-27 ENCOUNTER — Inpatient Hospital Stay (HOSPITAL_COMMUNITY)
Admission: AD | Admit: 2020-05-27 | Discharge: 2020-05-27 | Disposition: A | Discharge status: Home / Self Care | Payer: 59 | Source: Ambulatory Visit | Attending: Family Medicine | Admitting: Family Medicine

## 2020-05-27 DIAGNOSIS — O368120 Decreased fetal movements, second trimester, not applicable or unspecified: Secondary | ICD-10-CM | POA: Diagnosis not present

## 2020-05-27 DIAGNOSIS — O99282 Endocrine, nutritional and metabolic diseases complicating pregnancy, second trimester: Secondary | ICD-10-CM | POA: Insufficient documentation

## 2020-05-27 DIAGNOSIS — E039 Hypothyroidism, unspecified: Secondary | ICD-10-CM | POA: Insufficient documentation

## 2020-05-27 DIAGNOSIS — O288 Other abnormal findings on antenatal screening of mother: Secondary | ICD-10-CM

## 2020-05-27 DIAGNOSIS — Z3A24 24 weeks gestation of pregnancy: Secondary | ICD-10-CM | POA: Insufficient documentation

## 2020-05-27 DIAGNOSIS — Z79899 Other long term (current) drug therapy: Secondary | ICD-10-CM | POA: Insufficient documentation

## 2020-05-27 NOTE — MAU Note (Signed)
Brooke Sherman is a 34 y.o. at [redacted]w[redacted]d here in MAU reporting: DFM   Onset of complaint: pt states she spoke to  Her office about feeling less movement lately, they advised her to come for an evaluation,  She last felt movement this afternoon around 3pm Pain score: 0

## 2020-05-27 NOTE — MAU Provider Note (Signed)
History     CSN: [id number]  Arrival date and time: 05/27/20 2458   First Provider Initiated Contact with Patient 05/27/20 1956      Chief Complaint  Patient presents with  . Decreased Fetal Movement   HPI Brooke Sherman is a 34 y.o. G1P0 at 36w1dwho presents with decreased fetal movement. She states for the last 2 days the baby has been moving less but today she hasn't felt the baby move since 1500. She denies any leaking or bleeding. Denies any pain.  OB History    Gravida  1   Para      Term      Preterm      AB      Living        SAB      TAB      Ectopic      Multiple      Live Births              Past Medical History:  Diagnosis Date  . Hypothyroid     Past Surgical History:  Procedure Laterality Date  . ANTERIOR CRUCIATE LIGAMENT REPAIR Left 2019    History reviewed. No pertinent family history.  Social History   Tobacco Use  . Smoking status: Never Smoker  . Smokeless tobacco: Never Used  Vaping Use  . Vaping Use: Never used  Substance Use Topics  . Alcohol use: Not Currently  . Drug use: Not Currently    Allergies: No Known Allergies  Medications Prior to Admission  Medication Sig Dispense Refill Last Dose  . levothyroxine (SYNTHROID) 75 MCG tablet Take 1 tablet (75 mcg total) by mouth daily before breakfast. 30 tablet 3 05/27/2020 at Unknown time  . Prenatal Vit-Fe Fumarate-FA (PRENATAL VITAMINS PO) Take by mouth.   05/27/2020 at Unknown time  . Blood Pressure Monitor KIT 1 Device by Does not apply route once a week. To be monitored Regularly at home. 1 kit 0   . Misc. Devices (GOJJI WEIGHT SCALE) MISC Use as instructed 1 each 0     Review of Systems  Constitutional: Negative.  Negative for fatigue and fever.  HENT: Negative.   Respiratory: Negative.  Negative for shortness of breath.   Cardiovascular: Negative.  Negative for chest pain.  Gastrointestinal: Negative.  Negative for abdominal pain, constipation,  diarrhea, nausea and vomiting.  Genitourinary: Negative.  Negative for dysuria, vaginal bleeding and vaginal discharge.  Neurological: Negative.  Negative for dizziness and headaches.   Physical Exam   Blood pressure 121/65, pulse 61, temperature 98.4 F (36.9 C), resp. rate 17, last menstrual period 12/10/2019, SpO2 99 %.  Physical Exam Vitals and nursing note reviewed.  Constitutional:      General: She is not in acute distress.    Appearance: She is well-developed.  HENT:     Head: Normocephalic.  Eyes:     Pupils: Pupils are equal, round, and reactive to light.  Cardiovascular:     Rate and Rhythm: Normal rate and regular rhythm.     Heart sounds: Normal heart sounds.  Pulmonary:     Effort: Pulmonary effort is normal. No respiratory distress.     Breath sounds: Normal breath sounds.  Abdominal:     General: Bowel sounds are normal. There is no distension.     Palpations: Abdomen is soft.     Tenderness: There is no abdominal tenderness.  Skin:    General: Skin is warm and dry.  Neurological:  Mental Status: She is alert and oriented to person, place, and time.  Psychiatric:        Behavior: Behavior normal.        Thought Content: Thought content normal.        Judgment: Judgment normal.     Fetal Tracing:  Baseline: 150 Variability: moderate Accels: none Decels: none  Toco: none   MAU Course  Procedures  MDM NST reassuring for gestational age  Assessment and Plan   1. Decreased fetal movements in second trimester, single or unspecified fetus   2. [redacted] weeks gestation of pregnancy   3. Non-reactive NST (non-stress test)    -Discharge home in stable condition -Fetal movement precautions discussed -Patient advised to follow-up with OB as scheduled for prenatal care -Patient may return to MAU as needed or if her condition were to change or worsen    Wende Mott CNM 05/27/2020, 7:56 PM

## 2020-05-27 NOTE — Discharge Instructions (Signed)

## 2020-06-12 ENCOUNTER — Encounter: Payer: Self-pay | Admitting: Obstetrics and Gynecology

## 2020-06-12 ENCOUNTER — Other Ambulatory Visit: Payer: Self-pay

## 2020-06-12 ENCOUNTER — Ambulatory Visit (INDEPENDENT_AMBULATORY_CARE_PROVIDER_SITE_OTHER): Payer: 59 | Admitting: Obstetrics and Gynecology

## 2020-06-12 ENCOUNTER — Other Ambulatory Visit: Payer: 59

## 2020-06-12 VITALS — BP 112/76 | HR 70 | Ht 65.0 in | Wt 171.0 lb

## 2020-06-12 DIAGNOSIS — Z23 Encounter for immunization: Secondary | ICD-10-CM | POA: Diagnosis not present

## 2020-06-12 DIAGNOSIS — Z3402 Encounter for supervision of normal first pregnancy, second trimester: Secondary | ICD-10-CM | POA: Diagnosis not present

## 2020-06-12 DIAGNOSIS — O99283 Endocrine, nutritional and metabolic diseases complicating pregnancy, third trimester: Secondary | ICD-10-CM | POA: Diagnosis not present

## 2020-06-12 DIAGNOSIS — E039 Hypothyroidism, unspecified: Secondary | ICD-10-CM | POA: Diagnosis not present

## 2020-06-12 NOTE — Progress Notes (Signed)
   PRENATAL VISIT NOTE  Subjective:  Brooke Sherman is a 34 y.o. G1P0 at [redacted]w[redacted]d being seen today for ongoing prenatal care.  She is currently monitored for the following issues for this high-risk pregnancy and has Encounter for supervision of normal first pregnancy in second trimester and Hypothyroidism affecting pregnancy on their problem list.  Patient reports no complaints.  Contractions: Not present. Vag. Bleeding: None.  Movement: Present. Denies leaking of fluid.   The following portions of the patient's history were reviewed and updated as appropriate: allergies, current medications, past family history, past medical history, past social history, past surgical history and problem list.   Objective:   Vitals:   06/12/20 0828 06/12/20 0838  BP: 112/76   Pulse: 70   Weight: 171 lb (77.6 kg)   Height:  5\' 5"  (1.651 m)    Fetal Status: Fetal Heart Rate (bpm): 141 Fundal Height: 27 cm Movement: Present     General:  Alert, oriented and cooperative. Patient is in no acute distress.  Skin: Skin is warm and dry. No rash noted.   Cardiovascular: Normal heart rate noted  Respiratory: Normal respiratory effort, no problems with respiration noted  Abdomen: Soft, gravid, appropriate for gestational age.  Pain/Pressure: Absent     Pelvic: Cervical exam deferred        Extremities: Normal range of motion.  Edema: None  Mental Status: Normal mood and affect. Normal behavior. Normal judgment and thought content.   Assessment and Plan:  Pregnancy: G1P0 at [redacted]w[redacted]d 1. Encounter for supervision of normal first pregnancy in second trimester Patient is doing well without complaints Third trimester labs and glucola today Tdap and flu vaccine today Patient already received covid vaccine Patient undecided on pediatrician Patient plans COC for contraception Baby shower planned in October in November  2. Hypothyroidism affecting pregnancy in third trimester TSH today Continue  synthroid  Preterm labor symptoms and general obstetric precautions including but not limited to vaginal bleeding, contractions, leaking of fluid and fetal movement were reviewed in detail with the patient. Please refer to After Visit Summary for other counseling recommendations.   Return in about 2 weeks (around 06/26/2020) for in person, ROB, Low risk.  Future Appointments  Date Time Provider Department Center  07/28/2020  7:45 AM [facility] NURSE [facility]  07/28/2020  8:00 AM [facility] US1 [facility]    07/30/2020, MD

## 2020-06-13 LAB — RPR: RPR Ser Ql: NONREACTIVE

## 2020-06-13 LAB — CBC
Hematocrit: 35 % (ref 34.0–46.6)
Hemoglobin: 11.8 g/dL (ref 11.1–15.9)
MCH: 29.4 pg (ref 26.6–33.0)
MCHC: 33.7 g/dL (ref 31.5–35.7)
MCV: 87 fL (ref 79–97)
Platelets: 205 10*3/uL (ref 150–450)
RBC: 4.02 x10E6/uL (ref 3.77–5.28)
RDW: 12.9 % (ref 11.7–15.4)
WBC: 7.2 10*3/uL (ref 3.4–10.8)

## 2020-06-13 LAB — GLUCOSE TOLERANCE, 2 HOURS W/ 1HR
Glucose, 1 hour: 129 mg/dL (ref 65–179)
Glucose, 2 hour: 91 mg/dL (ref 65–152)
Glucose, Fasting: 78 mg/dL (ref 65–91)

## 2020-06-13 LAB — TSH: TSH: 2.47 u[IU]/mL (ref 0.450–4.500)

## 2020-06-13 LAB — HIV ANTIBODY (ROUTINE TESTING W REFLEX): HIV Screen 4th Generation wRfx: NONREACTIVE

## 2020-06-26 ENCOUNTER — Ambulatory Visit (INDEPENDENT_AMBULATORY_CARE_PROVIDER_SITE_OTHER): Payer: 59 | Admitting: Advanced Practice Midwife

## 2020-06-26 ENCOUNTER — Other Ambulatory Visit: Payer: Self-pay

## 2020-06-26 VITALS — BP 107/66 | HR 68 | Wt 173.0 lb

## 2020-06-26 DIAGNOSIS — O0990 Supervision of high risk pregnancy, unspecified, unspecified trimester: Secondary | ICD-10-CM

## 2020-06-26 DIAGNOSIS — Z3A28 28 weeks gestation of pregnancy: Secondary | ICD-10-CM

## 2020-06-26 DIAGNOSIS — O99283 Endocrine, nutritional and metabolic diseases complicating pregnancy, third trimester: Secondary | ICD-10-CM

## 2020-06-26 DIAGNOSIS — E039 Hypothyroidism, unspecified: Secondary | ICD-10-CM

## 2020-06-26 MED ORDER — BREAST PUMP MISC: 1.0000 | Freq: Every day | 0 refills | Status: DC

## 2020-06-26 NOTE — Progress Notes (Signed)
   PRENATAL VISIT NOTE  Subjective:  Brooke Sherman is a 34 y.o. G1P0 at [redacted]w[redacted]d being seen today for ongoing prenatal care.  She is currently monitored for the following issues for this high-risk pregnancy and has Encounter for supervision of normal first pregnancy in second trimester and Hypothyroidism affecting pregnancy on their problem list.  Patient reports no complaints.  Contractions: Not present. Vag. Bleeding: None.  Movement: Present. Denies leaking of fluid.   The following portions of the patient's history were reviewed and updated as appropriate: allergies, current medications, past family history, past medical history, past social history, past surgical history and problem list.   Objective:   Vitals:   06/26/20 0821  BP: 107/66  Pulse: 68  Weight: 173 lb (78.5 kg)    Fetal Status: Fetal Heart Rate (bpm): 145 Fundal Height: 28 cm Movement: Present     General:  Alert, oriented and cooperative. Patient is in no acute distress.  Skin: Skin is warm and dry. No rash noted.   Cardiovascular: Normal heart rate noted  Respiratory: Normal respiratory effort, no problems with respiration noted  Abdomen: Soft, gravid, appropriate for gestational age.  Pain/Pressure: Absent     Pelvic: Cervical exam deferred        Extremities: Normal range of motion.  Edema: None  Mental Status: Normal mood and affect. Normal behavior. Normal judgment and thought content.   Assessment and Plan:  Pregnancy: G1P0 at [redacted]w[redacted]d 1. Hypothyroidism affecting pregnancy in third trimester --TSH wnl at last visit, recheck again third trimester  2. [redacted] weeks gestation of pregnancy --Reviewed normal [place], follow up for growth scheduled  3. Supervision of high risk pregnancy, antepartum --Anticipatory guidance about next visits/weeks of pregnancy given. --Rx for breast pump printed today --Plans travel in third trimester, driving to [place].  Discussed frequent car breaks to walk, drinking plenty of  water.  Pt may want to take her OB records with her and should know where nearest hospital is for [facility] emergencies.  Preterm labor symptoms and general obstetric precautions including but not limited to vaginal bleeding, contractions, leaking of fluid and fetal movement were reviewed in detail with the patient. Please refer to After Visit Summary for other counseling recommendations.   Return in about 2 weeks (around 07/10/2020).  Future Appointments  Date Time Provider Department Center  07/10/2020  9:00 AM Brock Bad, MD [facility] None  07/28/2020  7:45 AM [facility] NURSE [facility]  07/28/2020  8:00 AM [facility] US1 [facility]    Sharen Counter, CNM

## 2020-06-26 NOTE — Patient Instructions (Signed)
Third Trimester of Pregnancy The third trimester is from week 28 through week 40 (months 7 through 9). The third trimester is a time when the unborn baby (fetus) is growing rapidly. At the end of the ninth month, the fetus is about 20 inches in length and weighs 6-10 pounds. Body changes during your third trimester Your body will continue to go through many changes during pregnancy. The changes vary from woman to woman. During the third trimester:  Your weight will continue to increase. You can expect to gain 25-35 pounds (11-16 kg) by the end of the pregnancy.  You may begin to get stretch marks on your hips, abdomen, and breasts.  You may urinate more often because the fetus is moving lower into your pelvis and pressing on your bladder.  You may develop or continue to have heartburn. This is caused by increased hormones that slow down muscles in the digestive tract.  You may develop or continue to have constipation because increased hormones slow digestion and cause the muscles that push waste through your intestines to relax.  You may develop hemorrhoids. These are swollen veins (varicose veins) in the rectum that can itch or be painful.  You may develop swollen, bulging veins (varicose veins) in your legs.  You may have increased body aches in the pelvis, back, or thighs. This is due to weight gain and increased hormones that are relaxing your joints.  You may have changes in your hair. These can include thickening of your hair, rapid growth, and changes in texture. Some women also have hair loss during or after pregnancy, or hair that feels dry or thin. Your hair will most likely return to normal after your baby is born.  Your breasts will continue to grow and they will continue to become tender. A yellow fluid (colostrum) may leak from your breasts. This is the first milk you are producing for your baby.  Your belly button may stick out.  You may notice more swelling in your hands,  face, or ankles.  You may have increased tingling or numbness in your hands, arms, and legs. The skin on your belly may also feel numb.  You may feel short of breath because of your expanding uterus.  You may have more problems sleeping. This can be caused by the size of your belly, increased need to urinate, and an increase in your body's metabolism.  You may notice the fetus "dropping," or moving lower in your abdomen (lightening).  You may have increased vaginal discharge.  You may notice your joints feel loose and you may have pain around your pelvic bone. What to expect at prenatal visits You will have prenatal exams every 2 weeks until week 36. Then you will have weekly prenatal exams. During a routine prenatal visit:  You will be weighed to make sure you and the baby are growing normally.  Your blood pressure will be taken.  Your abdomen will be measured to track your baby's growth.  The fetal heartbeat will be listened to.  Any test results from the previous visit will be discussed.  You may have a cervical check near your due date to see if your cervix has softened or thinned (effaced).  You will be tested for Group B streptococcus. This happens between 35 and 37 weeks. Your health care provider may ask you:  What your birth plan is.  How you are feeling.  If you are feeling the baby move.  If you have had any abnormal   symptoms, such as leaking fluid, bleeding, severe headaches, or abdominal cramping.  If you are using any tobacco products, including cigarettes, chewing tobacco, and electronic cigarettes.  If you have any questions. Other tests or screenings that may be performed during your third trimester include:  Blood tests that check for low iron levels (anemia).  Fetal testing to check the health, activity level, and growth of the fetus. Testing is done if you have certain medical conditions or if there are problems during the pregnancy.  Nonstress test  (NST). This test checks the health of your baby to make sure there are no signs of problems, such as the baby not getting enough oxygen. During this test, a belt is placed around your belly. The baby is made to move, and its heart rate is monitored during movement. What is false labor? False labor is a condition in which you feel small, irregular tightenings of the muscles in the womb (contractions) that usually go away with rest, changing position, or drinking water. These are called Braxton Hicks contractions. Contractions may last for hours, days, or even weeks before true labor sets in. If contractions come at regular intervals, become more frequent, increase in intensity, or become painful, you should see your health care provider. What are the signs of labor?  Abdominal cramps.  Regular contractions that start at 10 minutes apart and become stronger and more frequent with time.  Contractions that start on the top of the uterus and spread down to the lower abdomen and back.  Increased pelvic pressure and dull back pain.  A watery or bloody mucus discharge that comes from the vagina.  Leaking of amniotic fluid. This is also known as your "water breaking." It could be a slow trickle or a gush. Let your health care provider know if it has a color or strange odor. If you have any of these signs, call your health care provider right away, even if it is before your due date. Follow these instructions at home: Medicines  Follow your health care provider's instructions regarding medicine use. Specific medicines may be either safe or unsafe to take during pregnancy.  Take a prenatal vitamin that contains at least 600 micrograms (mcg) of folic acid.  If you develop constipation, try taking a stool softener if your health care provider approves. Eating and drinking   Eat a balanced diet that includes fresh fruits and vegetables, whole grains, good sources of protein such as meat, eggs, or tofu,  and low-fat dairy. Your health care provider will help you determine the amount of weight gain that is right for you.  Avoid raw meat and uncooked cheese. These carry germs that can cause birth defects in the baby.  If you have low calcium intake from food, talk to your health care provider about whether you should take a daily calcium supplement.  Eat four or five small meals rather than three large meals a day.  Limit foods that are high in fat and processed sugars, such as fried and sweet foods.  To prevent constipation: ? Drink enough fluid to keep your urine clear or pale yellow. ? Eat foods that are high in fiber, such as fresh fruits and vegetables, whole grains, and beans. Activity  Exercise only as directed by your health care provider. Most women can continue their usual exercise routine during pregnancy. Try to exercise for 30 minutes at least 5 days a week. Stop exercising if you experience uterine contractions.  Avoid heavy lifting.  Do   not exercise in extreme heat or humidity, or at high altitudes.  Wear low-heel, comfortable shoes.  Practice good posture.  You may continue to have sex unless your health care provider tells you otherwise. Relieving pain and discomfort  Take frequent breaks and rest with your legs elevated if you have leg cramps or low back pain.  Take warm sitz baths to soothe any pain or discomfort caused by hemorrhoids. Use hemorrhoid cream if your health care provider approves.  Wear a good support bra to prevent discomfort from breast tenderness.  If you develop varicose veins: ? Wear support pantyhose or compression stockings as told by your healthcare provider. ? Elevate your feet for 15 minutes, 3-4 times a day. Prenatal care  Write down your questions. Take them to your prenatal visits.  Keep all your prenatal visits as told by your health care provider. This is important. Safety  Wear your seat belt at all times when driving.  Make  a list of emergency phone numbers, including numbers for family, friends, the hospital, and police and fire departments. General instructions  Avoid cat litter boxes and soil used by cats. These carry germs that can cause birth defects in the baby. If you have a cat, ask someone to clean the litter box for you.  Do not travel far distances unless it is absolutely necessary and only with the approval of your health care provider.  Do not use hot tubs, steam rooms, or saunas.  Do not drink alcohol.  Do not use any products that contain nicotine or tobacco, such as cigarettes and e-cigarettes. If you need help quitting, ask your health care provider.  Do not use any medicinal herbs or unprescribed drugs. These chemicals affect the formation and growth of the baby.  Do not douche or use tampons or scented sanitary pads.  Do not cross your legs for long periods of time.  To prepare for the arrival of your baby: ? Take prenatal classes to understand, practice, and ask questions about labor and delivery. ? Make a trial run to the hospital. ? Visit the hospital and tour the maternity area. ? Arrange for maternity or paternity leave through employers. ? Arrange for family and friends to take care of pets while you are in the hospital. ? Purchase a rear-facing car seat and make sure you know how to install it in your car. ? Pack your hospital bag. ? Prepare the baby's nursery. Make sure to remove all pillows and stuffed animals from the baby's crib to prevent suffocation.  Visit your dentist if you have not gone during your pregnancy. Use a soft toothbrush to brush your teeth and be gentle when you floss. Contact a health care provider if:  You are unsure if you are in labor or if your water has broken.  You become dizzy.  You have mild pelvic cramps, pelvic pressure, or nagging pain in your abdominal area.  You have lower back pain.  You have persistent nausea, vomiting, or  diarrhea.  You have an unusual or bad smelling vaginal discharge.  You have pain when you urinate. Get help right away if:  Your water breaks before 37 weeks.  You have regular contractions less than 5 minutes apart before 37 weeks.  You have a fever.  You are leaking fluid from your vagina.  You have spotting or bleeding from your vagina.  You have severe abdominal pain or cramping.  You have rapid weight loss or weight gain.  You have   shortness of breath with chest pain.  You notice sudden or extreme swelling of your face, hands, ankles, feet, or legs.  Your baby makes fewer than 10 movements in 2 hours.  You have severe headaches that do not go away when you take medicine.  You have vision changes. Summary  The third trimester is from week 28 through week 40, months 7 through 9. The third trimester is a time when the unborn baby (fetus) is growing rapidly.  During the third trimester, your discomfort may increase as you and your baby continue to gain weight. You may have abdominal, leg, and back pain, sleeping problems, and an increased need to urinate.  During the third trimester your breasts will keep growing and they will continue to become tender. A yellow fluid (colostrum) may leak from your breasts. This is the first milk you are producing for your baby.  False labor is a condition in which you feel small, irregular tightenings of the muscles in the womb (contractions) that eventually go away. These are called Braxton Hicks contractions. Contractions may last for hours, days, or even weeks before true labor sets in.  Signs of labor can include: abdominal cramps; regular contractions that start at 10 minutes apart and become stronger and more frequent with time; watery or bloody mucus discharge that comes from the vagina; increased pelvic pressure and dull back pain; and leaking of amniotic fluid. This information is not intended to replace advice given to you by your  health care provider. Make sure you discuss any questions you have with your health care provider. Document Revised: 01/07/2019 Document Reviewed: 10/22/2016 Elsevier Patient Education  2020 [employer]  

## 2020-07-10 ENCOUNTER — Telehealth: Payer: 59 | Admitting: Obstetrics

## 2020-07-11 ENCOUNTER — Encounter: Payer: Self-pay | Admitting: Obstetrics

## 2020-07-11 ENCOUNTER — Telehealth (INDEPENDENT_AMBULATORY_CARE_PROVIDER_SITE_OTHER): Payer: 59 | Admitting: Obstetrics

## 2020-07-11 DIAGNOSIS — Z3A30 30 weeks gestation of pregnancy: Secondary | ICD-10-CM

## 2020-07-11 DIAGNOSIS — O0990 Supervision of high risk pregnancy, unspecified, unspecified trimester: Secondary | ICD-10-CM

## 2020-07-11 DIAGNOSIS — E039 Hypothyroidism, unspecified: Secondary | ICD-10-CM

## 2020-07-11 DIAGNOSIS — O99283 Endocrine, nutritional and metabolic diseases complicating pregnancy, third trimester: Secondary | ICD-10-CM

## 2020-07-11 NOTE — Progress Notes (Signed)
Pt reports good fetal movement, denies pain. Pt was unaware that BP cuff was at pharmacy, advised that she can pick it up or has the option to order one, pt voiced understanding.

## 2020-07-11 NOTE — Progress Notes (Signed)
   OBSTETRICS PRENATAL VIRTUAL VISIT ENCOUNTER NOTE  Provider location: [facility] for [facility] [place]   I connected with Janine Ores on 07/11/20 at 10:00 AM EDT by MyChart Video Encounter at home and verified that I am speaking with the correct person using two identifiers.   I discussed the limitations, risks, security and privacy concerns of performing an evaluation and management service virtually and the availability of in person appointments. I also discussed with the patient that there may be a patient responsible charge related to this service. The patient expressed understanding and agreed to proceed. Subjective:  Brooke Sherman is a 34 y.o. G1P0 at [redacted]w[redacted]d being seen today for ongoing prenatal care.  She is currently monitored for the following issues for this high-risk pregnancy and has Encounter for supervision of normal first pregnancy in second trimester and Hypothyroidism affecting pregnancy on their problem list.  Patient reports no complaints.  Contractions: Not present. Vag. Bleeding: None.  Movement: Present. Denies any leaking of fluid.   The following portions of the patient's history were reviewed and updated as appropriate: allergies, current medications, past family history, past medical history, past social history, past surgical history and problem list.   Objective:  There were no vitals filed for this visit.  Fetal Status:     Movement: Present     General:  Alert, oriented and cooperative. Patient is in no acute distress.  Respiratory: Normal respiratory effort, no problems with respiration noted  Mental Status: Normal mood and affect. Normal behavior. Normal judgment and thought content.  Rest of physical exam deferred due to type of encounter  Imaging: No results found.  Assessment and Plan:  Pregnancy: G1P0 at [redacted]w[redacted]d There are no diagnoses linked to this encounter. Preterm labor symptoms and general obstetric precautions  including but not limited to vaginal bleeding, contractions, leaking of fluid and fetal movement were reviewed in detail with the patient. I discussed the assessment and treatment plan with the patient. The patient was provided an opportunity to ask questions and all were answered. The patient agreed with the plan and demonstrated an understanding of the instructions. The patient was advised to call back or seek an in-person office evaluation/go to [facility] at [facility] for any urgent or concerning symptoms. Please refer to After Visit Summary for other counseling recommendations.   I provided 10 minutes of face-to-face time during this encounter.  Return in about 2 weeks (around 07/25/2020) for MyChart.  Future Appointments  Date Time Provider Department Center  07/28/2020  7:45 AM [facility] NURSE [facility]  07/28/2020  8:00 AM [facility] US1 [facility]    Coral Ceo, MD Center for [facility] 07/11/20

## 2020-07-25 ENCOUNTER — Telehealth (INDEPENDENT_AMBULATORY_CARE_PROVIDER_SITE_OTHER): Payer: 59 | Admitting: Women's Health

## 2020-07-25 VITALS — BP 125/69 | HR 75

## 2020-07-25 DIAGNOSIS — Z3A32 32 weeks gestation of pregnancy: Secondary | ICD-10-CM

## 2020-07-25 DIAGNOSIS — Z3402 Encounter for supervision of normal first pregnancy, second trimester: Secondary | ICD-10-CM

## 2020-07-25 DIAGNOSIS — O99283 Endocrine, nutritional and metabolic diseases complicating pregnancy, third trimester: Secondary | ICD-10-CM

## 2020-07-25 DIAGNOSIS — E039 Hypothyroidism, unspecified: Secondary | ICD-10-CM

## 2020-07-25 NOTE — Progress Notes (Signed)
Pt is on the phone preparing for virtual visit with provider. [redacted]w[redacted]d.   Pt does not have BP cuff available right now, she denies any symptoms of headache, blurry vision, or swelling.

## 2020-07-25 NOTE — Progress Notes (Signed)
I connected with Brooke Sherman 07/25/20 at  8:35 AM EDT by: MyChart video and verified that I am speaking with the correct person using two identifiers.  Patient is located at home and provider is located at [facility].     The purpose of this virtual visit is to provide medical care while limiting exposure to the novel coronavirus. I discussed the limitations, risks, security and privacy concerns of performing an evaluation and management service by MyChart video and the availability of in person appointments. I also discussed with the patient that there may be a patient responsible charge related to this service. By engaging in this virtual visit, you consent to the provision of healthcare.  Additionally, you authorize for your insurance to be billed for the services provided during this visit.  The patient expressed understanding and agreed to proceed.  The following staff members participated in the virtual visit:  Donia Ast    PRENATAL VISIT NOTE  Subjective:  Brooke Sherman is a 34 y.o. G1P0 at [redacted]w[redacted]d  for phone visit for ongoing prenatal care.  She is currently monitored for the following issues for this low-risk pregnancy and has Encounter for supervision of normal first pregnancy in second trimester and Hypothyroidism affecting pregnancy on their problem list.  Patient reports no complaints.  Contractions: Not present. Vag. Bleeding: None.  Movement: Present. Denies leaking of fluid.   The following portions of the patient's history were reviewed and updated as appropriate: allergies, current medications, past family history, past medical history, past social history, past surgical history and problem list.   Objective:   Vitals:   07/25/20 0831  BP: 125/69  Pulse: 75  Self-obtained   Fetal Status:     Movement: Present     Assessment and Plan:  Pregnancy: G1P0 at [redacted]w[redacted]d  1. Encounter for supervision of normal first pregnancy in second  trimester -anticipatory guidance given on upcoming visits -discussed virtual vs. in-person visits -antibody screen negative per records 01/2020  2. Hypothyroidism affecting pregnancy in third trimester -[place] scheduled 07/28/2020  Preterm labor symptoms and general obstetric precautions including but not limited to vaginal bleeding, contractions, leaking of fluid and fetal movement were reviewed in detail with the patient. I discussed the assessment and treatment plan with the patient. The patient was provided an opportunity to ask questions and all were answered. The patient agreed with the plan and demonstrated an understanding of the instructions. The patient was advised to call back or seek an in-person office evaluation/go to MAU at [facility] for any urgent or concerning symptoms.  Return in about 2 weeks (around 08/08/2020) for virtual ROB/APP OK.  Future Appointments  Date Time Provider Department Center  07/28/2020  7:45 AM [facility] NURSE [facility]  07/28/2020  8:00 AM [facility] US1 [facility]     Time spent on virtual visit: 14 minutes  Marylen Ponto, NP

## 2020-07-25 NOTE — Patient Instructions (Addendum)
Maternity Assessment Unit (MAU)  The Maternity Assessment Unit (MAU) is located at the [facility] at [facility]. The address is: [address] [place], [place], [place] [address]. Please see map below for additional directions.    The Maternity Assessment Unit is designed to help you during your pregnancy, and for up to 6 weeks after delivery, with any pregnancy- or postpartum-related emergencies, if you think you are in labor, or if your water has broken. For example, if you experience nausea and vomiting, vaginal bleeding, severe abdominal or pelvic pain, elevated blood pressure or other problems related to your pregnancy or postpartum time, please come to the Maternity Assessment Unit for assistance.        Third Trimester of Pregnancy The third trimester is from week 28 through week 40 (months 7 through 9). The third trimester is a time when the unborn baby (fetus) is growing rapidly. At the end of the ninth month, the fetus is about 20 inches in length and weighs 6-10 pounds. Body changes during your third trimester Your body will continue to go through many changes during pregnancy. The changes vary from woman to woman. During the third trimester:  Your weight will continue to increase. You can expect to gain 25-35 pounds (11-16 kg) by the end of the pregnancy.  You may begin to get stretch marks on your hips, abdomen, and breasts.  You may urinate more often because the fetus is moving lower into your pelvis and pressing on your bladder.  You may develop or continue to have heartburn. This is caused by increased hormones that slow down muscles in the digestive tract.  You may develop or continue to have constipation because increased hormones slow digestion and cause the muscles that push waste through your intestines to relax.  You may develop hemorrhoids. These are swollen veins (varicose veins) in the rectum that can itch or be  painful.  You may develop swollen, bulging veins (varicose veins) in your legs.  You may have increased body aches in the pelvis, back, or thighs. This is due to weight gain and increased hormones that are relaxing your joints.  You may have changes in your hair. These can include thickening of your hair, rapid growth, and changes in texture. Some women also have hair loss during or after pregnancy, or hair that feels dry or thin. Your hair will most likely return to normal after your baby is born.  Your breasts will continue to grow and they will continue to become tender. A yellow fluid (colostrum) may leak from your breasts. This is the first milk you are producing for your baby.  Your belly button may stick out.  You may notice more swelling in your hands, face, or ankles.  You may have increased tingling or numbness in your hands, arms, and legs. The skin on your belly may also feel numb.  You may feel short of breath because of your expanding uterus.  You may have more problems sleeping. This can be caused by the size of your belly, increased need to urinate, and an increase in your body's metabolism.  You may notice the fetus "dropping," or moving lower in your abdomen (lightening).  You may have increased vaginal discharge.  You may notice your joints feel loose and you may have pain around your pelvic bone. What to expect at prenatal visits You will have prenatal exams every 2 weeks until week 36. Then you will have weekly prenatal exams.  During a routine prenatal visit:  You will be weighed to make sure you and the baby are growing normally.  Your blood pressure will be taken.  Your abdomen will be measured to track your baby's growth.  The fetal heartbeat will be listened to.  Any test results from the previous visit will be discussed.  You may have a cervical check near your due date to see if your cervix has softened or thinned (effaced).  You will be tested for  Group B streptococcus. This happens between 35 and 37 weeks. Your health care provider may ask you:  What your birth plan is.  How you are feeling.  If you are feeling the baby move.  If you have had any abnormal symptoms, such as leaking fluid, bleeding, severe headaches, or abdominal cramping.  If you are using any tobacco products, including cigarettes, chewing tobacco, and electronic cigarettes.  If you have any questions. Other tests or screenings that may be performed during your third trimester include:  Blood tests that check for low iron levels (anemia).  Fetal testing to check the health, activity level, and growth of the fetus. Testing is done if you have certain medical conditions or if there are problems during the pregnancy.  Nonstress test (NST). This test checks the health of your baby to make sure there are no signs of problems, such as the baby not getting enough oxygen. During this test, a belt is placed around your belly. The baby is made to move, and its heart rate is monitored during movement. What is false labor? False labor is a condition in which you feel small, irregular tightenings of the muscles in the womb (contractions) that usually go away with rest, changing position, or drinking water. These are called Braxton Hicks contractions. Contractions may last for hours, days, or even weeks before true labor sets in. If contractions come at regular intervals, become more frequent, increase in intensity, or become painful, you should see your health care provider. What are the signs of labor?  Abdominal cramps.  Regular contractions that start at 10 minutes apart and become stronger and more frequent with time.  Contractions that start on the top of the uterus and spread down to the lower abdomen and back.  Increased pelvic pressure and dull back pain.  A watery or bloody mucus discharge that comes from the vagina.  Leaking of amniotic fluid. This is also  known as your "water breaking." It could be a slow trickle or a gush. Let your health care provider know if it has a color or strange odor. If you have any of these signs, call your health care provider right away, even if it is before your due date. Follow these instructions at home: Medicines  Follow your health care provider's instructions regarding medicine use. Specific medicines may be either safe or unsafe to take during pregnancy.  Take a prenatal vitamin that contains at least 600 micrograms (mcg) of folic acid.  If you develop constipation, try taking a stool softener if your health care provider approves. Eating and drinking   Eat a balanced diet that includes fresh fruits and vegetables, whole grains, good sources of protein such as meat, eggs, or tofu, and low-fat dairy. Your health care provider will help you determine the amount of weight gain that is right for you.  Avoid raw meat and uncooked cheese. These carry germs that can cause birth defects in the baby.  If you have low calcium intake from  food, talk to your health care provider about whether you should take a daily calcium supplement.  Eat four or five small meals rather than three large meals a day.  Limit foods that are high in fat and processed sugars, such as fried and sweet foods.  To prevent constipation: ? Drink enough fluid to keep your urine clear or pale yellow. ? Eat foods that are high in fiber, such as fresh fruits and vegetables, whole grains, and beans. Activity  Exercise only as directed by your health care provider. Most women can continue their usual exercise routine during pregnancy. Try to exercise for 30 minutes at least 5 days a week. Stop exercising if you experience uterine contractions.  Avoid heavy lifting.  Do not exercise in extreme heat or humidity, or at high altitudes.  Wear low-heel, comfortable shoes.  Practice good posture.  You may continue to have sex unless your health  care provider tells you otherwise. Relieving pain and discomfort  Take frequent breaks and rest with your legs elevated if you have leg cramps or low back pain.  Take warm sitz baths to soothe any pain or discomfort caused by hemorrhoids. Use hemorrhoid cream if your health care provider approves.  Wear a good support bra to prevent discomfort from breast tenderness.  If you develop varicose veins: ? Wear support pantyhose or compression stockings as told by your healthcare provider. ? Elevate your feet for 15 minutes, 3-4 times a day. Prenatal care  Write down your questions. Take them to your prenatal visits.  Keep all your prenatal visits as told by your health care provider. This is important. Safety  Wear your seat belt at all times when driving.  Make a list of emergency phone numbers, including numbers for family, friends, the hospital, and police and fire departments. General instructions  Avoid cat litter boxes and soil used by cats. These carry germs that can cause birth defects in the baby. If you have a cat, ask someone to clean the litter box for you.  Do not travel far distances unless it is absolutely necessary and only with the approval of your health care provider.  Do not use hot tubs, steam rooms, or saunas.  Do not drink alcohol.  Do not use any products that contain nicotine or tobacco, such as cigarettes and e-cigarettes. If you need help quitting, ask your health care provider.  Do not use any medicinal herbs or unprescribed drugs. These chemicals affect the formation and growth of the baby.  Do not douche or use tampons or scented sanitary pads.  Do not cross your legs for long periods of time.  To prepare for the arrival of your baby: ? Take prenatal classes to understand, practice, and ask questions about labor and delivery. ? Make a trial run to the hospital. ? Visit the hospital and tour the maternity area. ? Arrange for maternity or paternity  leave through employers. ? Arrange for family and friends to take care of pets while you are in the hospital. ? Purchase a rear-facing car seat and make sure you know how to install it in your car. ? Pack your hospital bag. ? Prepare the baby's nursery. Make sure to remove all pillows and stuffed animals from the baby's crib to prevent suffocation.  Visit your dentist if you have not gone during your pregnancy. Use a soft toothbrush to brush your teeth and be gentle when you floss. Contact a health care provider if:  You are unsure if  you are in labor or if your water has broken.  You become dizzy.  You have mild pelvic cramps, pelvic pressure, or nagging pain in your abdominal area.  You have lower back pain.  You have persistent nausea, vomiting, or diarrhea.  You have an unusual or bad smelling vaginal discharge.  You have pain when you urinate. Get help right away if:  Your water breaks before 37 weeks.  You have regular contractions less than 5 minutes apart before 37 weeks.  You have a fever.  You are leaking fluid from your vagina.  You have spotting or bleeding from your vagina.  You have severe abdominal pain or cramping.  You have rapid weight loss or weight gain.  You have shortness of breath with chest pain.  You notice sudden or extreme swelling of your face, hands, ankles, feet, or legs.  Your baby makes fewer than 10 movements in 2 hours.  You have severe headaches that do not go away when you take medicine.  You have vision changes. Summary  The third trimester is from week 28 through week 40, months 7 through 9. The third trimester is a time when the unborn baby (fetus) is growing rapidly.  During the third trimester, your discomfort may increase as you and your baby continue to gain weight. You may have abdominal, leg, and back pain, sleeping problems, and an increased need to urinate.  During the third trimester your breasts will keep growing  and they will continue to become tender. A yellow fluid (colostrum) may leak from your breasts. This is the first milk you are producing for your baby.  False labor is a condition in which you feel small, irregular tightenings of the muscles in the womb (contractions) that eventually go away. These are called Braxton Hicks contractions. Contractions may last for hours, days, or even weeks before true labor sets in.  Signs of labor can include: abdominal cramps; regular contractions that start at 10 minutes apart and become stronger and more frequent with time; watery or bloody mucus discharge that comes from the vagina; increased pelvic pressure and dull back pain; and leaking of amniotic fluid. This information is not intended to replace advice given to you by your health care provider. Make sure you discuss any questions you have with your health care provider. Document Revised: 01/07/2019 Document Reviewed: 10/22/2016 Elsevier Patient Education  2020 [employer].         Preterm Labor and Birth Information  The normal length of a pregnancy is 39-41 weeks. Preterm labor is when labor starts before 37 completed weeks of pregnancy. What are the risk factors for preterm labor? Preterm labor is more likely to occur in women who:  Have certain infections during pregnancy such as a bladder infection, sexually transmitted infection, or infection inside the uterus (chorioamnionitis).  Have a shorter-than-normal cervix.  Have gone into preterm labor before.  Have had surgery on their cervix.  Are younger than age 72 or older than age 55.  Are African American.  Are pregnant with twins or multiple babies (multiple gestation).  Take street drugs or smoke while pregnant.  Do not gain enough weight while pregnant.  Became pregnant shortly after having been pregnant. What are the symptoms of preterm labor? Symptoms of preterm labor include:  Cramps similar to those that can happen  during a menstrual period. The cramps may happen with diarrhea.  Pain in the abdomen or lower back.  Regular uterine contractions that may feel like  tightening of the abdomen.  A feeling of increased pressure in the pelvis.  Increased watery or bloody mucus discharge from the vagina.  Water breaking (ruptured amniotic sac). Why is it important to recognize signs of preterm labor? It is important to recognize signs of preterm labor because babies who are born prematurely may not be fully developed. This can put them at an increased risk for:  Long-term (chronic) heart and lung problems.  Difficulty immediately after birth with regulating body systems, including blood sugar, body temperature, heart rate, and breathing rate.  Bleeding in the brain.  Cerebral palsy.  Learning difficulties.  Death. These risks are highest for babies who are born before 34 weeks of pregnancy. How is preterm labor treated? Treatment depends on the length of your pregnancy, your condition, and the health of your baby. It may involve:  Having a stitch (suture) placed in your cervix to prevent your cervix from opening too early (cerclage).  Taking or being given medicines, such as: ? Hormone medicines. These may be given early in pregnancy to help support the pregnancy. ? Medicine to stop contractions. ? Medicines to help mature the baby's lungs. These may be prescribed if the risk of delivery is high. ? Medicines to prevent your baby from developing cerebral palsy. If the labor happens before 34 weeks of pregnancy, you may need to stay in the hospital. What should I do if I think I am in preterm labor? If you think that you are going into preterm labor, call your health care provider right away. How can I prevent preterm labor in future pregnancies? To increase your chance of having a full-term pregnancy:  Do not use any tobacco products, such as cigarettes, chewing tobacco, and e-cigarettes. If you  need help quitting, ask your health care provider.  Do not use street drugs or medicines that have not been prescribed to you during your pregnancy.  Talk with your health care provider before taking any herbal supplements, even if you have been taking them regularly.  Make sure you gain a healthy amount of weight during your pregnancy.  Watch for infection. If you think that you might have an infection, get it checked right away.  Make sure to tell your health care provider if you have gone into preterm labor before. This information is not intended to replace advice given to you by your health care provider. Make sure you discuss any questions you have with your health care provider. Document Revised: 01/08/2019 Document Reviewed: 02/07/2016 Elsevier Patient Education  2020 Else[employer] Inc.        Group B Streptococcus Test During Pregnancy Why am I having this test? Routine testing, also called screening, for group B streptococcus (GBS) is recommended for all pregnant women between the 36th and 37th week of pregnancy. GBS is a type of bacteria that can be passed from mother to baby during childbirth. Screening will help guide whether or not you will need treatment during labor and delivery to prevent complications such as:  An infection in your uterus during labor.  An infection in your uterus after delivery.  A serious infection in your baby after delivery, such as pneumonia, meningitis, or sepsis. GBS screening is not often done before 36 weeks of pregnancy unless you go into labor prematurely. What happens if I have group B streptococcus? If testing shows that you have GBS, your health care provider will recommend treatment with IV antibiotics during labor and delivery. This treatment significantly decreases the risk of  complications for you and your baby. If you have a planned C-section and you have GBS, you may not need to be treated with antibiotics because GBS is usually  passed to babies after labor starts and your water breaks. If you are in labor or your water breaks before your C-section, it is possible for GBS to get into your uterus and be passed to your baby, so you might need treatment. Is there a chance I may not need to be tested? You may not need to be tested for GBS if:  You have a urine test that shows GBS before 36 to 37 weeks.  You had a baby with GBS infection after a previous delivery. In these cases, you will automatically be treated for GBS during labor and delivery. What is being tested? This test is done to check if you have group B streptococcus in your vagina or rectum. What kind of sample is taken? To collect samples for this test, your health care provider will swab your vagina and rectum with a cotton swab. The sample is then sent to the lab to see if GBS is present. What happens during the test?   You will remove your clothing from the waist down.  You will lie down on an exam table in the same position as you would for a pelvic exam.  Your health care provider will swab your vagina and rectum to collect samples for a culture test.  You will be able to go home after the test and do all your usual activities. How are the results reported? The test results are reported as positive or negative. What do the results mean?  A positive test means you are at risk for passing GBS to your baby during labor and delivery. Your health care provider will recommend that you are treated with an IV antibiotic during labor and delivery.  A negative test means you are at very low risk of passing GBS to your baby. There is still a low risk of passing GBS to your baby because sometimes test results may report that you do not have a condition when you do (false-negative result) or there is a chance that you may become infected with GBS after the test is done. You most likely will not need to be treated with an antibiotic during labor and  delivery. Talk with your health care provider about what your results mean. Questions to ask your health care provider Ask your health care provider, or the department that is doing the test:  When will my results be ready?  How will I get my results?  What are my treatment options? Summary  Routine testing (screening) for group B streptococcus (GBS) is recommended for all pregnant women between the 36th and 37th week of pregnancy.  GBS is a type of bacteria that can be passed from mother to baby during childbirth.  If testing shows that you have GBS, your health care provider will recommend that you are treated with IV antibiotics during labor and delivery. This treatment almost always prevents infection in newborns. This information is not intended to replace advice given to you by your health care provider. Make sure you discuss any questions you have with your health care provider. Document Revised: 01/07/2019 Document Reviewed: 10/14/2018 Elsevier Patient Education  2020 [employer].

## 2020-07-28 ENCOUNTER — Ambulatory Visit: Payer: 59 | Attending: Obstetrics

## 2020-07-28 ENCOUNTER — Encounter: Payer: Self-pay | Admitting: *Deleted

## 2020-07-28 ENCOUNTER — Other Ambulatory Visit: Payer: Self-pay

## 2020-07-28 ENCOUNTER — Ambulatory Visit: Payer: 59 | Admitting: *Deleted

## 2020-07-28 VITALS — BP 112/67 | HR 84

## 2020-07-28 DIAGNOSIS — Z363 Encounter for antenatal screening for malformations: Secondary | ICD-10-CM

## 2020-07-28 DIAGNOSIS — E039 Hypothyroidism, unspecified: Secondary | ICD-10-CM | POA: Diagnosis not present

## 2020-07-28 DIAGNOSIS — Z3A33 33 weeks gestation of pregnancy: Secondary | ICD-10-CM

## 2020-07-28 DIAGNOSIS — O99283 Endocrine, nutritional and metabolic diseases complicating pregnancy, third trimester: Secondary | ICD-10-CM

## 2020-07-28 DIAGNOSIS — O99280 Endocrine, nutritional and metabolic diseases complicating pregnancy, unspecified trimester: Secondary | ICD-10-CM | POA: Insufficient documentation

## 2020-07-28 IMAGING — US US MFM OB FOLLOW-UP
1 series · 14 of 28 positions shown · non-contrast
Comparison: none

[Series 1: us mfm ob follow-up · 14 of 45 slices shown]
[im 2/45]
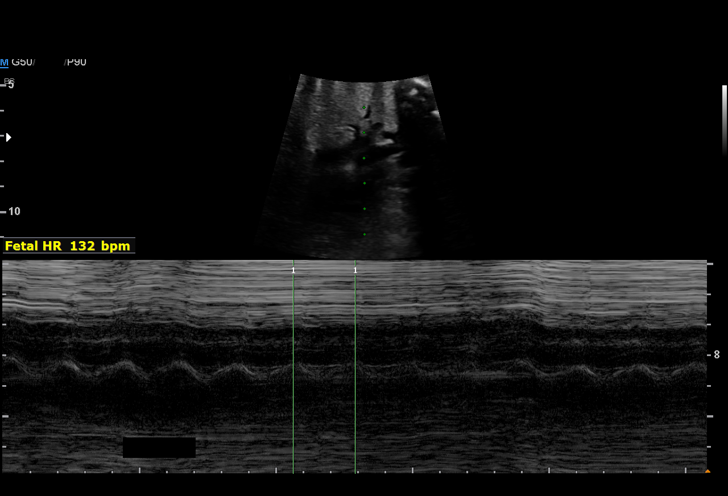
[im 5/45]
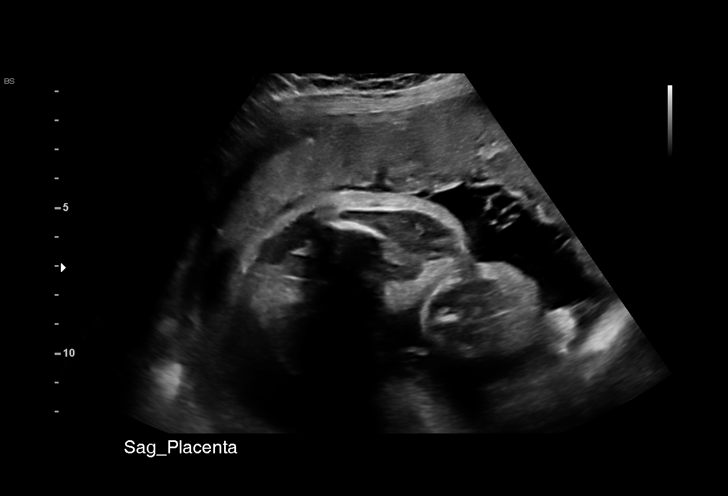
[im 9/45]
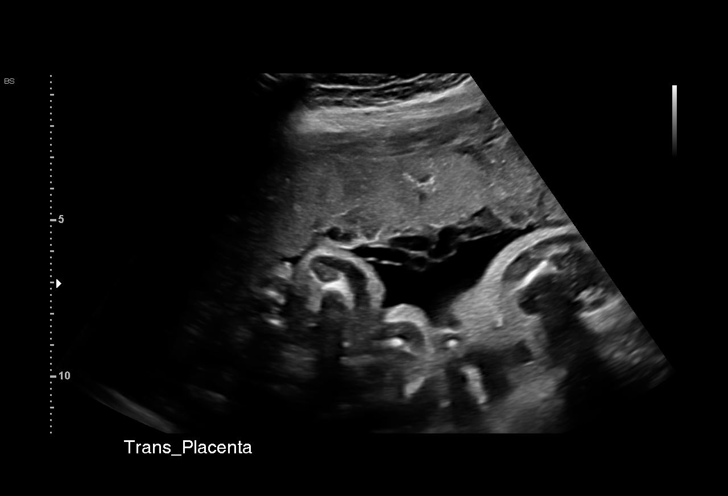
[im 12/45]
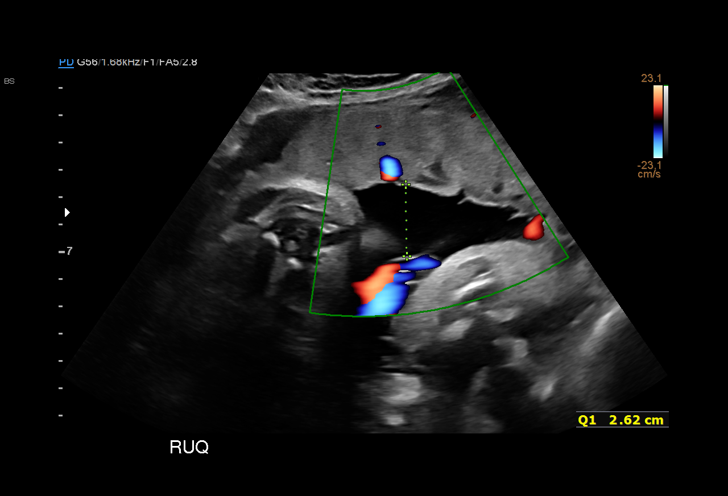
[im 15/45]
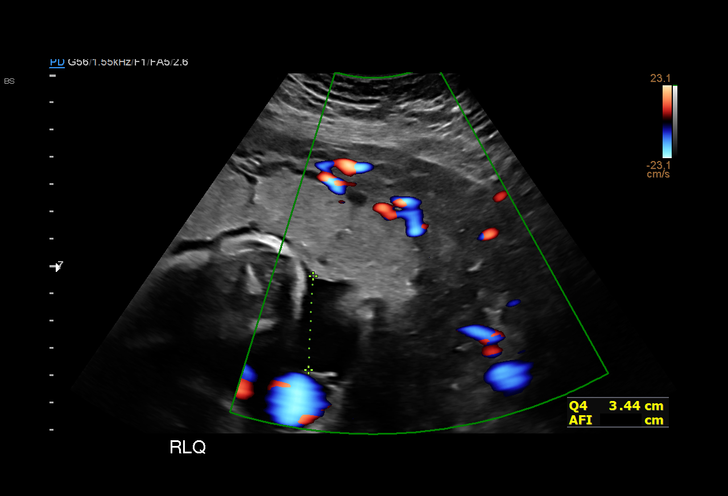
[im 18/45]
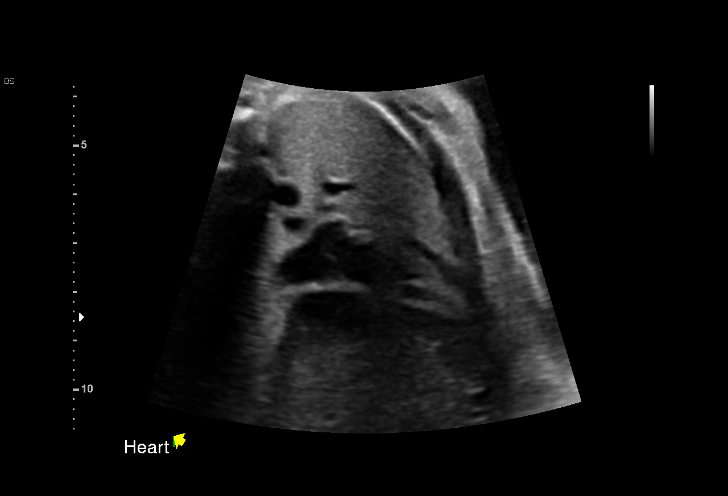
[im 22/45]
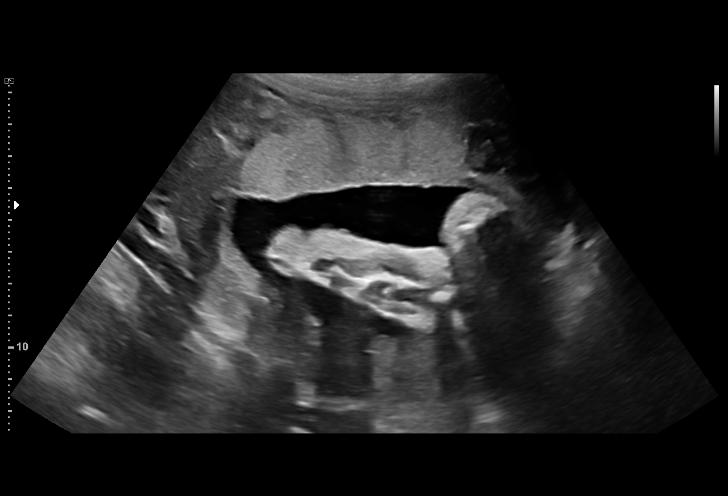
[im 25/45]
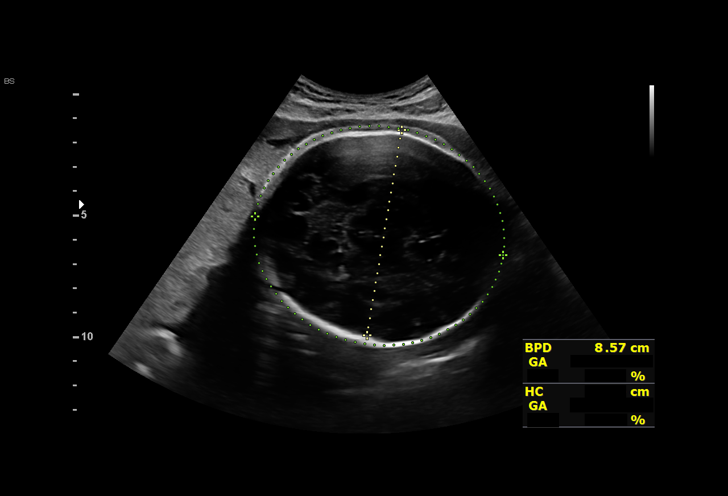
[im 28/45]
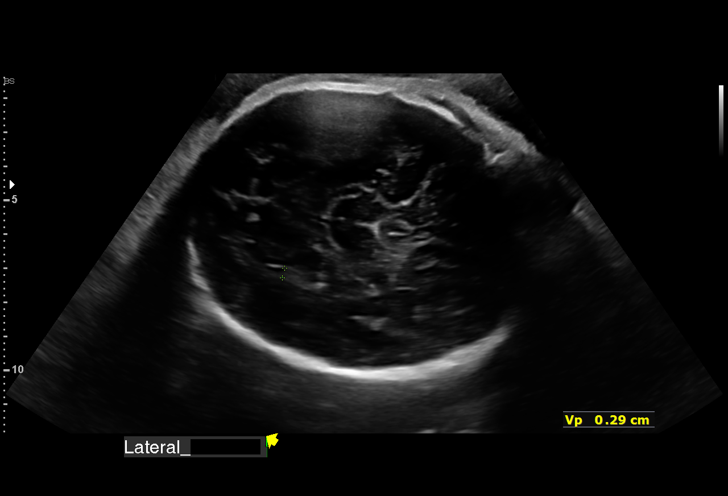
[im 31/45]
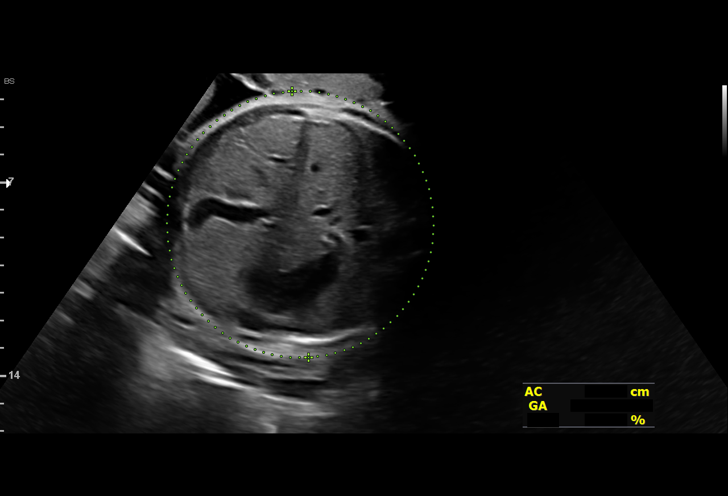
[im 35/45]
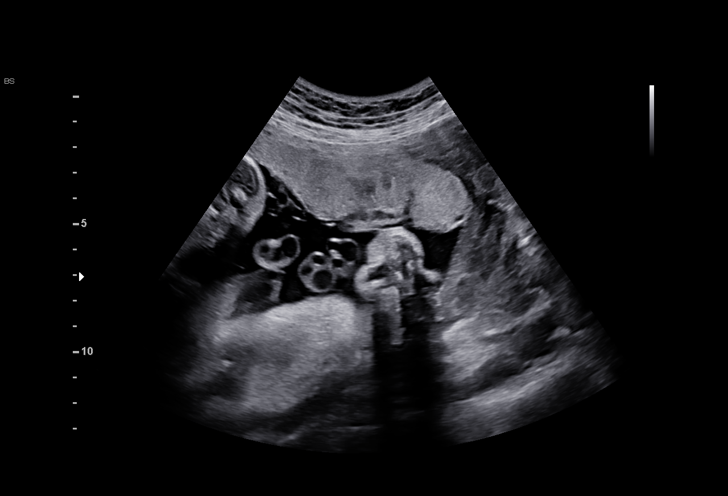
[im 38/45]
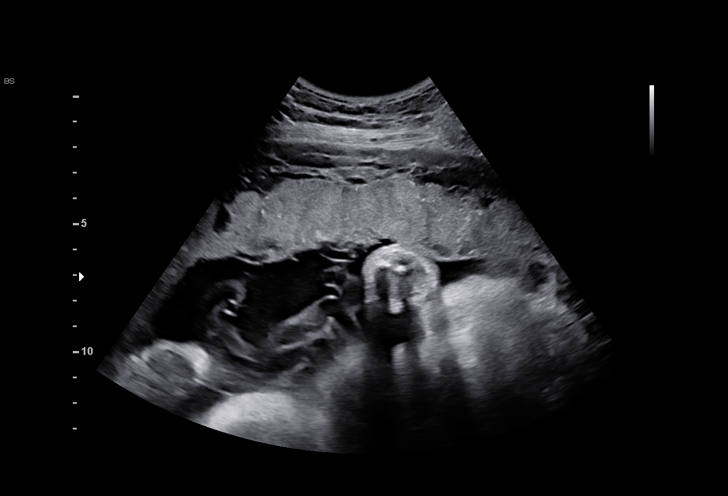
[im 41/45]
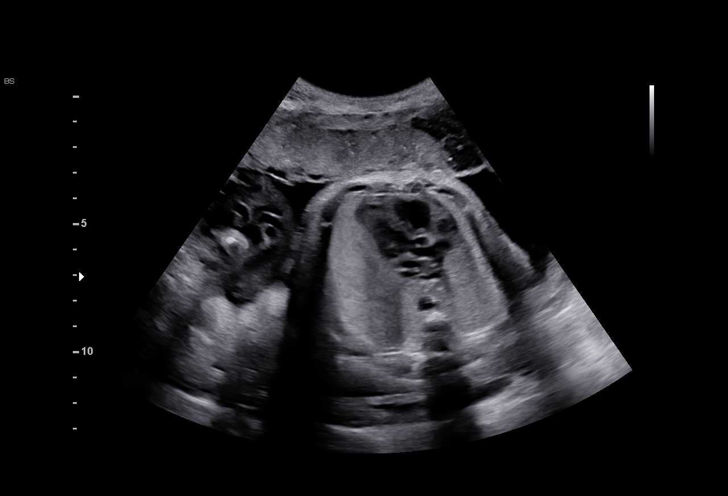
[im 45/45]
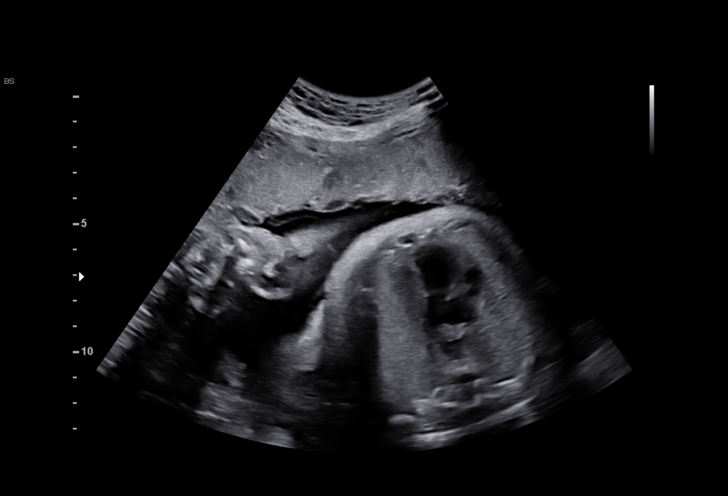

[14 of 28 positions shown; findings below may reference images not displayed]

THEKA

Indications

 Hypothyroid                                    [TY] [TY]
 LR NIPS (per pt), neg AFP
 Encounter for antenatal screening for          [TY]
 malformations
 33 weeks gestation of pregnancy
Fetal Evaluation

 Num Of Fetuses:         1
 Fetal Heart Rate(bpm):  132
 Cardiac Activity:       Observed
 Presentation:           Cephalic
 Placenta:               Anterior
 P. Cord Insertion:      Visualized, central

 Amniotic Fluid
 AFI FV:      Within normal limits

 AFI Sum(cm)     %Tile       Largest Pocket(cm)
 15.4            55

 RUQ(cm)       RLQ(cm)       LUQ(cm)        LLQ(cm)

Biometry

 BPD:      85.5  mm     G. Age:  34w 3d         83  %    CI:        77.03   %    70 - 86
                                                         FL/HC:      20.3   %    19.9 -
 HC:      308.5  mm     G. Age:  34w 3d         50  %    HC/AC:      1.02        0.96 -
 AC:      302.5  mm     G. Age:  34w 1d         83  %    FL/BPD:     73.3   %    71 - 87
 FL:       62.7  mm     G. Age:  32w 3d         24  %    FL/AC:      20.7   %    20 - 24
 LV:        2.9  mm

 Est. FW:    [TY]  gm           5 lb     65  %
OB History

 Gravidity:    1
Gestational Age

 LMP:           33w 0d        Date:  [DATE]                 EDD:   [DATE]
 U/S Today:     33w 6d                                        EDD:   [DATE]
 Best:          33w 0d     Det. By:  LMP  ([DATE])          EDD:   [DATE]
Anatomy

 Cranium:               Previously seen        LVOT:                   Previously seen
 Cavum:                 Previously seen        Aortic Arch:            Previously seen
 Ventricles:            Appears normal         Ductal Arch:            Previously seen
 Choroid Plexus:        Previously seen        Diaphragm:              Appears normal
 Cerebellum:            Previously seen        Stomach:                Appears normal, left
                                                                       sided
 Posterior Fossa:       Previously seen        Abdomen:                Appears normal
 Nuchal Fold:           Not applicable (>20    Abdominal Wall:         Previously seen
                        wks GA)
 Face:                  Orbits and profile     Cord Vessels:           Previously seen
                        previously seen
 Lips:                  Previously seen        Kidneys:                Appear normal
 Palate:                Previously seen        Bladder:                Appears normal
 Thoracic:              Previously seen        Spine:                  Previously seen
 Heart:                 Previously seen        Upper Extremities:      Previously seen
 RVOT:                  Previously seen        Lower Extremities:      Previously seen

 Other:  Parents do not wish to know sex of fetus. Heels visualized. Open
         hands prev visualized. Nasal bone prev visualized. Technically
         difficult due to fetal position.
Cervix Uterus Adnexa

 Cervix
 Not visualized (advanced GA >[TY])
Comments

 This patient was seen for a follow up growth scan due to a
 history of hypothyroidism.  She reports that she has screened
 negative for gestational diabetes in her current pregnancy
 and denies any problems since her last exam.
 She was informed that the fetal growth and amniotic fluid
 level appears appropriate for her gestational age.
 As the fetal growth is within normal limits, no further exams
 were scheduled in our office.

## 2020-08-08 ENCOUNTER — Ambulatory Visit (INDEPENDENT_AMBULATORY_CARE_PROVIDER_SITE_OTHER): Payer: 59 | Admitting: Nurse Practitioner

## 2020-08-08 ENCOUNTER — Encounter: Payer: Self-pay | Admitting: Nurse Practitioner

## 2020-08-08 ENCOUNTER — Other Ambulatory Visit: Payer: Self-pay

## 2020-08-08 VITALS — BP 124/81 | HR 68 | Wt 179.0 lb

## 2020-08-08 DIAGNOSIS — Z3A34 34 weeks gestation of pregnancy: Secondary | ICD-10-CM

## 2020-08-08 DIAGNOSIS — Z3402 Encounter for supervision of normal first pregnancy, second trimester: Secondary | ICD-10-CM

## 2020-08-08 DIAGNOSIS — O99283 Endocrine, nutritional and metabolic diseases complicating pregnancy, third trimester: Secondary | ICD-10-CM

## 2020-08-08 DIAGNOSIS — E039 Hypothyroidism, unspecified: Secondary | ICD-10-CM

## 2020-08-08 NOTE — Patient Instructions (Signed)
[email or web address] for childbirth and breastfeeding classes °

## 2020-08-08 NOTE — Progress Notes (Signed)
    Subjective:  Brooke Sherman is a 34 y.o. G1P0 at [redacted]w[redacted]d being seen today for ongoing prenatal care.  She is currently monitored for the following issues for this low-risk pregnancy and has Encounter for supervision of normal first pregnancy in second trimester and Hypothyroidism affecting pregnancy on their problem list.  Patient reports hip pain especially at night when turning over in bed.  Contractions: Not present. Vag. Bleeding: None.  Movement: Present. Denies leaking of fluid.   The following portions of the patient's history were reviewed and updated as appropriate: allergies, current medications, past family history, past medical history, past social history, past surgical history and problem list. Problem list updated.  Objective:   Vitals:   08/08/20 1111  BP: 124/81  Pulse: 68  Weight: 179 lb (81.2 kg)    Fetal Status: Fetal Heart Rate (bpm): 132 Fundal Height: 35 cm Movement: Present     General:  Alert, oriented and cooperative. Patient is in no acute distress.  Skin: Skin is warm and dry. No rash noted.   Cardiovascular: Normal heart rate noted  Respiratory: Normal respiratory effort, no problems with respiration noted  Abdomen: Soft, gravid, appropriate for gestational age. Pain/Pressure: Absent     Pelvic:  Cervical exam deferred        Extremities: Normal range of motion.  Edema: None  Mental Status: Normal mood and affect. Normal behavior. Normal judgment and thought content.   Urinalysis:      Assessment and Plan:  Pregnancy: G1P0 at [redacted]w[redacted]d  1. Encounter for supervision of normal first pregnancy in second trimester Encouraged to sign up for virtual classes at [email or web address] Hip pain in pregnancy - likely related to pubic symphysis discomfort - declines referral to PT Encouraged stretching low back and reviewed some exercises to help allievate hip pain] Encouraged to take BP weekly and reviewed high reading - 140/90 - either value  2.  Hypothyroidism affecting pregnancy in third trimester    Preterm labor symptoms and general obstetric precautions including but not limited to vaginal bleeding, contractions, leaking of fluid and fetal movement were reviewed in detail with the patient. Please refer to After Visit Summary for other counseling recommendations.  Return in about 2 weeks (around 08/22/2020) for in person ROB for vaginal swabs.  Nolene Bernheim, RN, MSN, NP-BC Nurse Practitioner, [facility] for [employer], [facility] 08/08/2020 11:51 AM

## 2020-08-08 NOTE — Progress Notes (Signed)
ROB 34w  CC: Hip pain

## 2020-08-21 DIAGNOSIS — Z3400 Encounter for supervision of normal first pregnancy, unspecified trimester: Secondary | ICD-10-CM | POA: Diagnosis not present

## 2020-08-22 ENCOUNTER — Ambulatory Visit (INDEPENDENT_AMBULATORY_CARE_PROVIDER_SITE_OTHER): Payer: 59 | Admitting: Certified Nurse Midwife

## 2020-08-22 ENCOUNTER — Other Ambulatory Visit (HOSPITAL_COMMUNITY)
Admission: RE | Admit: 2020-08-22 | Discharge: 2020-08-22 | Disposition: A | Discharge status: Home / Self Care | Payer: 59 | Source: Ambulatory Visit | Attending: Certified Nurse Midwife | Admitting: Certified Nurse Midwife

## 2020-08-22 ENCOUNTER — Encounter: Payer: Self-pay | Admitting: Certified Nurse Midwife

## 2020-08-22 ENCOUNTER — Other Ambulatory Visit: Payer: Self-pay

## 2020-08-22 VITALS — BP 109/67 | HR 75 | Wt 182.0 lb

## 2020-08-22 DIAGNOSIS — Z3403 Encounter for supervision of normal first pregnancy, third trimester: Secondary | ICD-10-CM

## 2020-08-22 DIAGNOSIS — Z3A36 36 weeks gestation of pregnancy: Secondary | ICD-10-CM | POA: Insufficient documentation

## 2020-08-22 DIAGNOSIS — E039 Hypothyroidism, unspecified: Secondary | ICD-10-CM

## 2020-08-22 DIAGNOSIS — Z7689 Persons encountering health services in other specified circumstances: Secondary | ICD-10-CM | POA: Diagnosis not present

## 2020-08-22 DIAGNOSIS — O99280 Endocrine, nutritional and metabolic diseases complicating pregnancy, unspecified trimester: Secondary | ICD-10-CM

## 2020-08-22 NOTE — Progress Notes (Signed)
ROB/GBS.  C/o pelvic pressure.

## 2020-08-22 NOTE — Progress Notes (Signed)
   PRENATAL VISIT NOTE  Subjective:  Brooke Sherman is a 34 y.o. G1P0 at [redacted]w[redacted]d being seen today for ongoing prenatal care.  She is currently monitored for the following issues for this low-risk pregnancy and has Encounter for supervision of normal first pregnancy in second trimester and Hypothyroidism affecting pregnancy on their problem list.  Patient reports no complaints.  Contractions: Not present. Vag. Bleeding: None.  Movement: Present. Denies leaking of fluid.   The following portions of the patient's history were reviewed and updated as appropriate: allergies, current medications, past family history, past medical history, past social history, past surgical history and problem list.   Objective:   Vitals:   08/22/20 0852  BP: 109/67  Pulse: 75  Weight: 182 lb (82.6 kg)    Fetal Status: Fetal Heart Rate (bpm): 151 Fundal Height: 33 cm Movement: Present  Presentation: Vertex  General:  Alert, oriented and cooperative. Patient is in no acute distress.  Skin: Skin is warm and dry. No rash noted.   Cardiovascular: Normal heart rate noted  Respiratory: Normal respiratory effort, no problems with respiration noted  Abdomen: Soft, gravid, appropriate for gestational age.  Pain/Pressure: Present     Pelvic: Cervical exam deferred        Extremities: Normal range of motion.  Edema: None  Mental Status: Normal mood and affect. Normal behavior. Normal judgment and thought content.   Assessment and Plan:  Pregnancy: G1P0 at [redacted]w[redacted]d 1. Encounter for supervision of normal first pregnancy in third trimester - patient doing well, no complaints or concerns at this time  - routine prenatal care - anticipatory guidance on upcoming appointments with next being virtual  - labor precautions, BH vs true contractions discussed with patient, patient denies currently having contractions  - visitor policy reviewed and patient's questions answered   2. Hypothyroidism affecting pregnancy,  antepartum - currently on of levothyroxine   3. [redacted] weeks gestation of pregnancy - Strep Gp B NAA - Cervicovaginal ancillary only( [place])  Preterm labor symptoms and general obstetric precautions including but not limited to vaginal bleeding, contractions, leaking of fluid and fetal movement were reviewed in detail with the patient. Please refer to After Visit Summary for other counseling recommendations.   Return in about 1 week (around 08/29/2020) for LROB, virtual.  Future Appointments  Date Time Provider Department Center  08/29/2020  8:55 AM [place], Wilmer Floor, CNM [facility] None    Sharyon Cable, CNM

## 2020-08-22 NOTE — Patient Instructions (Signed)
Reasons to go to MAU:  1.  Contractions are  5 minutes apart or less, each last 1 minute, these have been going on for 1-2 hours, and you cannot walk or talk during them 2.  You have a large gush of fluid, or a trickle of fluid that will not stop and you have to wear a pad 3.  You have bleeding that is bright red, heavier than spotting--like menstrual bleeding (spotting can be normal in early labor or after a check of your cervix) 4.  You do not feel the baby moving like he/she normally does  

## 2020-08-23 LAB — CERVICOVAGINAL ANCILLARY ONLY
Chlamydia: NEGATIVE
Comment: NEGATIVE
Comment: NORMAL
Neisseria Gonorrhea: NEGATIVE

## 2020-08-24 LAB — STREP GP B NAA: Strep Gp B NAA: NEGATIVE

## 2020-08-29 ENCOUNTER — Other Ambulatory Visit: Payer: Self-pay

## 2020-08-29 ENCOUNTER — Ambulatory Visit (INDEPENDENT_AMBULATORY_CARE_PROVIDER_SITE_OTHER): Payer: 59 | Admitting: Advanced Practice Midwife

## 2020-08-29 VITALS — BP 117/68 | HR 68 | Wt 182.0 lb

## 2020-08-29 DIAGNOSIS — E039 Hypothyroidism, unspecified: Secondary | ICD-10-CM

## 2020-08-29 DIAGNOSIS — Z3403 Encounter for supervision of normal first pregnancy, third trimester: Secondary | ICD-10-CM

## 2020-08-29 DIAGNOSIS — O99280 Endocrine, nutritional and metabolic diseases complicating pregnancy, unspecified trimester: Secondary | ICD-10-CM

## 2020-08-29 NOTE — Progress Notes (Signed)
° °  PRENATAL VISIT NOTE  Subjective:  Brooke Sherman is a 34 y.o. G1P0 at [redacted]w[redacted]d being seen today for ongoing prenatal care.  She is currently monitored for the following issues for this low-risk pregnancy and has Encounter for supervision of normal first pregnancy in second trimester and Hypothyroidism affecting pregnancy on their problem list.  Patient reports occasional contractions.  Contractions: Irregular. Vag. Bleeding: None.  Movement: Present. Denies leaking of fluid.   The following portions of the patient's history were reviewed and updated as appropriate: allergies, current medications, past family history, past medical history, past social history, past surgical history and problem list.   Objective:   Vitals:   08/29/20 0857  BP: 117/68  Pulse: 68  Weight: 182 lb (82.6 kg)    Fetal Status: Fetal Heart Rate (bpm): 145 Fundal Height: 37 cm Movement: Present  Presentation: Vertex  General:  Alert, oriented and cooperative. Patient is in no acute distress.  Skin: Skin is warm and dry. No rash noted.   Cardiovascular: Normal heart rate noted  Respiratory: Normal respiratory effort, no problems with respiration noted  Abdomen: Soft, gravid, appropriate for gestational age.  Pain/Pressure: Present     Pelvic: Cervical exam performed in the presence of a chaperone Dilation: 1 Effacement (%): 50 Station: -3  Extremities: Normal range of motion.     Mental Status: Normal mood and affect. Normal behavior. Normal judgment and thought content.   Assessment and Plan:  Pregnancy: G1P0 at [redacted]w[redacted]d 1. Encounter for supervision of normal first pregnancy in third trimester --Anticipatory guidance about next visits/weeks of pregnancy given. --Next visit in 1 week in office --Discussed labor readiness/cervical ripening with pt and printed materials given  2. Hypothyroidism affecting pregnancy, antepartum --Stable on Synthroid, third trimester US wnl  Term labor symptoms and  general obstetric precautions including but not limited to vaginal bleeding, contractions, leaking of fluid and fetal movement were reviewed in detail with the patient. Please refer to After Visit Summary for other counseling recommendations.   Return in about 1 week (around 09/05/2020).  No future appointments.  Sharen Counter, CNM

## 2020-08-29 NOTE — Patient Instructions (Signed)
Things to Try After 37 weeks to Encourage Labor/Get Ready for Labor:   1.  Try the Miles Circuit at www.milescircuit.com daily to improve baby's position and encourage the onset of labor.  2. Walk a little and rest a little every day.  Change positions often.  3. Cervical Ripening: May try one or both a. Red Raspberry Leaf capsules or tea:  two 300mg or 400mg tablets with each meal, 2-3 times a day, or 1-3 cups of tea daily  Potential Side Effects Of Raspberry Leaf:  Most women do not experience any side effects from drinking raspberry leaf tea. However, nausea and loose stools are possible   b. Evening Primrose Oil capsules: take 1 capsule by mouth and place one capsule in the vagina every night.    Some of the potential side effects:  Upset stomach  Loose stools or diarrhea  Headaches  Nausea  4. Sex can also help the cervix ripen and encourage labor onset.    Labor Precautions Reasons to come to MAU at [facility]:  1.  Contractions are  5 minutes apart or less, each last 1 minute, these have been going on for 1-2 hours, and you cannot walk or talk during them 2.  You have a large gush of fluid, or a trickle of fluid that will not stop and you have to wear a pad 3.  You have bleeding that is bright red, heavier than spotting--like menstrual bleeding (spotting can be normal in early labor or after a check of your cervix) 4.  You do not feel the baby moving like he/she normally does 

## 2020-08-29 NOTE — Progress Notes (Signed)
Pt is having increase pelvic pressure and ctx, would like cervix check today.

## 2020-09-05 ENCOUNTER — Encounter: Payer: Self-pay | Admitting: Advanced Practice Midwife

## 2020-09-05 ENCOUNTER — Ambulatory Visit (INDEPENDENT_AMBULATORY_CARE_PROVIDER_SITE_OTHER): Payer: 59 | Admitting: Advanced Practice Midwife

## 2020-09-05 ENCOUNTER — Other Ambulatory Visit: Payer: Self-pay

## 2020-09-05 VITALS — BP 112/72 | HR 63 | Wt 184.0 lb

## 2020-09-05 DIAGNOSIS — Z3403 Encounter for supervision of normal first pregnancy, third trimester: Secondary | ICD-10-CM

## 2020-09-05 DIAGNOSIS — Z3A38 38 weeks gestation of pregnancy: Secondary | ICD-10-CM

## 2020-09-05 NOTE — Progress Notes (Signed)
   PRENATAL VISIT NOTE  Subjective:  Brooke Sherman is a 34 y.o. G1P0 at [redacted]w[redacted]d being seen today for ongoing prenatal care.  She is currently monitored for the following issues for this low-risk pregnancy and has Encounter for supervision of normal first pregnancy and Hypothyroidism affecting pregnancy on their problem list.  Patient reports no complaints.  Contractions: Irritability. Vag. Bleeding: None.  Movement: Present. Denies leaking of fluid.   The following portions of the patient's history were reviewed and updated as appropriate: allergies, current medications, past family history, past medical history, past social history, past surgical history and problem list.   Objective:   Vitals:   09/05/20 0959  BP: 112/72  Pulse: 63  Weight: 184 lb (83.5 kg)    Fetal Status: Fetal Heart Rate (bpm): 144 Fundal Height: 38 cm Movement: Present  Presentation: Vertex  General:  Alert, oriented and cooperative. Patient is in no acute distress.  Skin: Skin is warm and dry. No rash noted.   Cardiovascular: Normal heart rate noted  Respiratory: Normal respiratory effort, no problems with respiration noted  Abdomen: Soft, gravid, appropriate for gestational age.  Pain/Pressure: Present     Pelvic: Cervical exam performed in the presence of a chaperone Dilation: 1 Effacement (%): 50 Station: -2, -1  Extremities: Normal range of motion.  Edema: None  Mental Status: Normal mood and affect. Normal behavior. Normal judgment and thought content.   Assessment and Plan:  Pregnancy: G1P0 at [redacted]w[redacted]d 1. [redacted] weeks gestation of pregnancy - routine care - Reviewed post-dates management   2. Encounter for supervision of normal first pregnancy in third trimester   Term labor symptoms and general obstetric precautions including but not limited to vaginal bleeding, contractions, leaking of fluid and fetal movement were reviewed in detail with the patient. Please refer to After Visit Summary for  other counseling recommendations.   Return in about 1 week (around 09/12/2020).  No future appointments.  Thressa Sheller DNP, CNM  09/05/20  10:26 AM

## 2020-09-05 NOTE — Progress Notes (Signed)
Pt presents for ROB requests cx check today.  

## 2020-09-12 ENCOUNTER — Other Ambulatory Visit: Payer: Self-pay | Admitting: Women's Health

## 2020-09-12 ENCOUNTER — Ambulatory Visit (INDEPENDENT_AMBULATORY_CARE_PROVIDER_SITE_OTHER): Payer: 59 | Admitting: Advanced Practice Midwife

## 2020-09-12 ENCOUNTER — Other Ambulatory Visit: Payer: Self-pay

## 2020-09-12 VITALS — BP 125/79 | HR 72 | Wt 188.0 lb

## 2020-09-12 DIAGNOSIS — Z3A39 39 weeks gestation of pregnancy: Secondary | ICD-10-CM

## 2020-09-12 DIAGNOSIS — E039 Hypothyroidism, unspecified: Secondary | ICD-10-CM

## 2020-09-12 DIAGNOSIS — O99280 Endocrine, nutritional and metabolic diseases complicating pregnancy, unspecified trimester: Secondary | ICD-10-CM

## 2020-09-12 DIAGNOSIS — Z3403 Encounter for supervision of normal first pregnancy, third trimester: Secondary | ICD-10-CM

## 2020-09-12 NOTE — Progress Notes (Signed)
   PRENATAL VISIT NOTE   Subjective:  Brooke Sherman is a 34 y.o. G1P0 at [redacted]w[redacted]d being seen today for ongoing prenatal care.  She is currently monitored for the following issues for this low-risk pregnancy and has Encounter for supervision of normal first pregnancy and Hypothyroidism affecting pregnancy on their problem list.  Patient reports occasional contractions.  Contractions: Irritability. Vag. Bleeding: None.  Movement: Present. Denies leaking of fluid.   The following portions of the patient's history were reviewed and updated as appropriate: allergies, current medications, past family history, past medical history, past social history, past surgical history and problem list.   Objective:   Vitals:   09/12/20 1311  BP: 125/79  Pulse: 72  Weight: 188 lb (85.3 kg)    Fetal Status: Fetal Heart Rate (bpm): 140 Fundal Height: 39 cm Movement: Present  Presentation: Vertex  General:  Alert, oriented and cooperative. Patient is in no acute distress.  Skin: Skin is warm and dry. No rash noted.   Cardiovascular: Normal heart rate noted  Respiratory: Normal respiratory effort, no problems with respiration noted  Abdomen: Soft, gravid, appropriate for gestational age.  Pain/Pressure: Present     Pelvic: Cervical exam performed in the presence of a chaperone Dilation: 1 Effacement (%): 50 Station: -1  Extremities: Normal range of motion.  Edema: None  Mental Status: Normal mood and affect. Normal behavior. Normal judgment and thought content.   Assessment and Plan:  Pregnancy: G1P0 at [redacted]w[redacted]d 1. Encounter for supervision of normal first pregnancy in third trimester --Anticipatory guidance about next visits/weeks of pregnancy given. --pt desires IOL before holiday next weekend.  Cervix is 1/50, anterior with low station of -1 on today's exam --Membranes swept at pt request today and elective IOL scheduled for Monday 09/18/20, pt will be [redacted]w[redacted]d.   2. Hypothyroidism affecting  pregnancy, antepartum   3. [redacted] weeks gestation of pregnancy   Term labor symptoms and general obstetric precautions including but not limited to vaginal bleeding, contractions, leaking of fluid and fetal movement were reviewed in detail with the patient. Please refer to After Visit Summary for other counseling recommendations.   Return in about 1 week (around 09/19/2020).  No future appointments.  Sharen Counter, CNM

## 2020-09-12 NOTE — Progress Notes (Signed)
*  Do not mention weight  Pt would like a cervix check today Pt would like membrane swept  Pt notes getting COVID Booster last Monday.

## 2020-09-12 NOTE — Patient Instructions (Signed)
Things to Try After 37 weeks to Encourage Labor/Get Ready for Labor:   1.  Try the Miles Circuit at www.milescircuit.com daily to improve baby's position and encourage the onset of labor.  2. Walk a little and rest a little every day.  Change positions often.  3. Cervical Ripening: May try one or both a. Red Raspberry Leaf capsules or tea:  two 300mg or 400mg tablets with each meal, 2-3 times a day, or 1-3 cups of tea daily  Potential Side Effects Of Raspberry Leaf:  Most women do not experience any side effects from drinking raspberry leaf tea. However, nausea and loose stools are possible   b. Evening Primrose Oil capsules: take 1 capsule by mouth and place one capsule in the vagina every night.    Some of the potential side effects:  Upset stomach  Loose stools or diarrhea  Headaches  Nausea  4. Sex can also help the cervix ripen and encourage labor onset.    Labor Precautions Reasons to come to MAU at [facility]:  1.  Contractions are  5 minutes apart or less, each last 1 minute, these have been going on for 1-2 hours, and you cannot walk or talk during them 2.  You have a large gush of fluid, or a trickle of fluid that will not stop and you have to wear a pad 3.  You have bleeding that is bright red, heavier than spotting--like menstrual bleeding (spotting can be normal in early labor or after a check of your cervix) 4.  You do not feel the baby moving like he/she normally does 

## 2020-09-13 ENCOUNTER — Other Ambulatory Visit: Payer: Self-pay | Admitting: Advanced Practice Midwife

## 2020-09-14 ENCOUNTER — Telehealth (HOSPITAL_COMMUNITY): Payer: Self-pay | Admitting: *Deleted

## 2020-09-14 ENCOUNTER — Encounter (HOSPITAL_COMMUNITY): Payer: Self-pay | Admitting: *Deleted

## 2020-09-14 NOTE — Telephone Encounter (Signed)
Preadmission screen  

## 2020-09-16 ENCOUNTER — Other Ambulatory Visit (HOSPITAL_COMMUNITY)
Admission: RE | Admit: 2020-09-16 | Discharge: 2020-09-16 | Disposition: A | Discharge status: Home / Self Care | Payer: 59 | Source: Ambulatory Visit | Attending: Family Medicine | Admitting: Family Medicine

## 2020-09-16 DIAGNOSIS — Z3A40 40 weeks gestation of pregnancy: Secondary | ICD-10-CM

## 2020-09-16 DIAGNOSIS — O99284 Endocrine, nutritional and metabolic diseases complicating childbirth: Secondary | ICD-10-CM | POA: Diagnosis not present

## 2020-09-16 DIAGNOSIS — Z20822 Contact with and (suspected) exposure to covid-19: Secondary | ICD-10-CM | POA: Diagnosis not present

## 2020-09-16 DIAGNOSIS — E039 Hypothyroidism, unspecified: Secondary | ICD-10-CM | POA: Diagnosis present

## 2020-09-16 DIAGNOSIS — O480 Post-term pregnancy: Principal | ICD-10-CM | POA: Diagnosis present

## 2020-09-16 LAB — SARS CORONAVIRUS 2 (TAT 6-24 HRS): SARS Coronavirus 2: NEGATIVE

## 2020-09-17 ENCOUNTER — Other Ambulatory Visit: Payer: Self-pay | Admitting: Advanced Practice Midwife

## 2020-09-18 ENCOUNTER — Other Ambulatory Visit: Payer: Self-pay | Admitting: Advanced Practice Midwife

## 2020-09-18 ENCOUNTER — Encounter: Payer: 59 | Admitting: Women's Health

## 2020-09-18 ENCOUNTER — Other Ambulatory Visit: Payer: Self-pay

## 2020-09-18 ENCOUNTER — Inpatient Hospital Stay (HOSPITAL_COMMUNITY): Payer: 59

## 2020-09-18 ENCOUNTER — Ambulatory Visit (INDEPENDENT_AMBULATORY_CARE_PROVIDER_SITE_OTHER): Payer: 59

## 2020-09-18 ENCOUNTER — Inpatient Hospital Stay (HOSPITAL_COMMUNITY)
Admission: AD | Admit: 2020-09-18 | Discharge: 2020-09-20 | DRG: 807 | Disposition: A | Discharge status: Home / Self Care | Payer: 59 | Attending: Family Medicine | Admitting: Family Medicine

## 2020-09-18 ENCOUNTER — Ambulatory Visit (INDEPENDENT_AMBULATORY_CARE_PROVIDER_SITE_OTHER): Payer: 59 | Admitting: Women's Health

## 2020-09-18 ENCOUNTER — Encounter (HOSPITAL_COMMUNITY): Payer: Self-pay | Admitting: Family Medicine

## 2020-09-18 VITALS — BP 130/83 | HR 69 | Wt 187.8 lb

## 2020-09-18 DIAGNOSIS — E039 Hypothyroidism, unspecified: Secondary | ICD-10-CM

## 2020-09-18 DIAGNOSIS — Z3403 Encounter for supervision of normal first pregnancy, third trimester: Secondary | ICD-10-CM

## 2020-09-18 DIAGNOSIS — O480 Post-term pregnancy: Secondary | ICD-10-CM | POA: Diagnosis not present

## 2020-09-18 DIAGNOSIS — Z3A40 40 weeks gestation of pregnancy: Secondary | ICD-10-CM | POA: Diagnosis not present

## 2020-09-18 DIAGNOSIS — O99284 Endocrine, nutritional and metabolic diseases complicating childbirth: Secondary | ICD-10-CM | POA: Diagnosis not present

## 2020-09-18 DIAGNOSIS — Z20822 Contact with and (suspected) exposure to covid-19: Secondary | ICD-10-CM | POA: Diagnosis present

## 2020-09-18 DIAGNOSIS — O99283 Endocrine, nutritional and metabolic diseases complicating pregnancy, third trimester: Secondary | ICD-10-CM

## 2020-09-18 DIAGNOSIS — O26893 Other specified pregnancy related conditions, third trimester: Secondary | ICD-10-CM | POA: Diagnosis present

## 2020-09-18 LAB — CBC
HCT: 34.8 % — ABNORMAL LOW (ref 36.0–46.0)
Hemoglobin: 12.3 g/dL (ref 12.0–15.0)
MCH: 29.9 pg (ref 26.0–34.0)
MCHC: 35.3 g/dL (ref 30.0–36.0)
MCV: 84.7 fL (ref 80.0–100.0)
Platelets: 182 10*3/uL (ref 150–400)
RBC: 4.11 MIL/uL (ref 3.87–5.11)
RDW: 13.7 % (ref 11.5–15.5)
WBC: 6.9 10*3/uL (ref 4.0–10.5)
nRBC: 0 % (ref 0.0–0.2)

## 2020-09-18 LAB — TYPE AND SCREEN
ABO/RH(D): B POS
Antibody Screen: NEGATIVE

## 2020-09-18 IMAGING — US US OB LIMITED
1 series · 5 of 5 positions shown · non-contrast
Comparison: none

[Series 1: us ob limited · 5 of 5 slices shown]
[im 1/5]
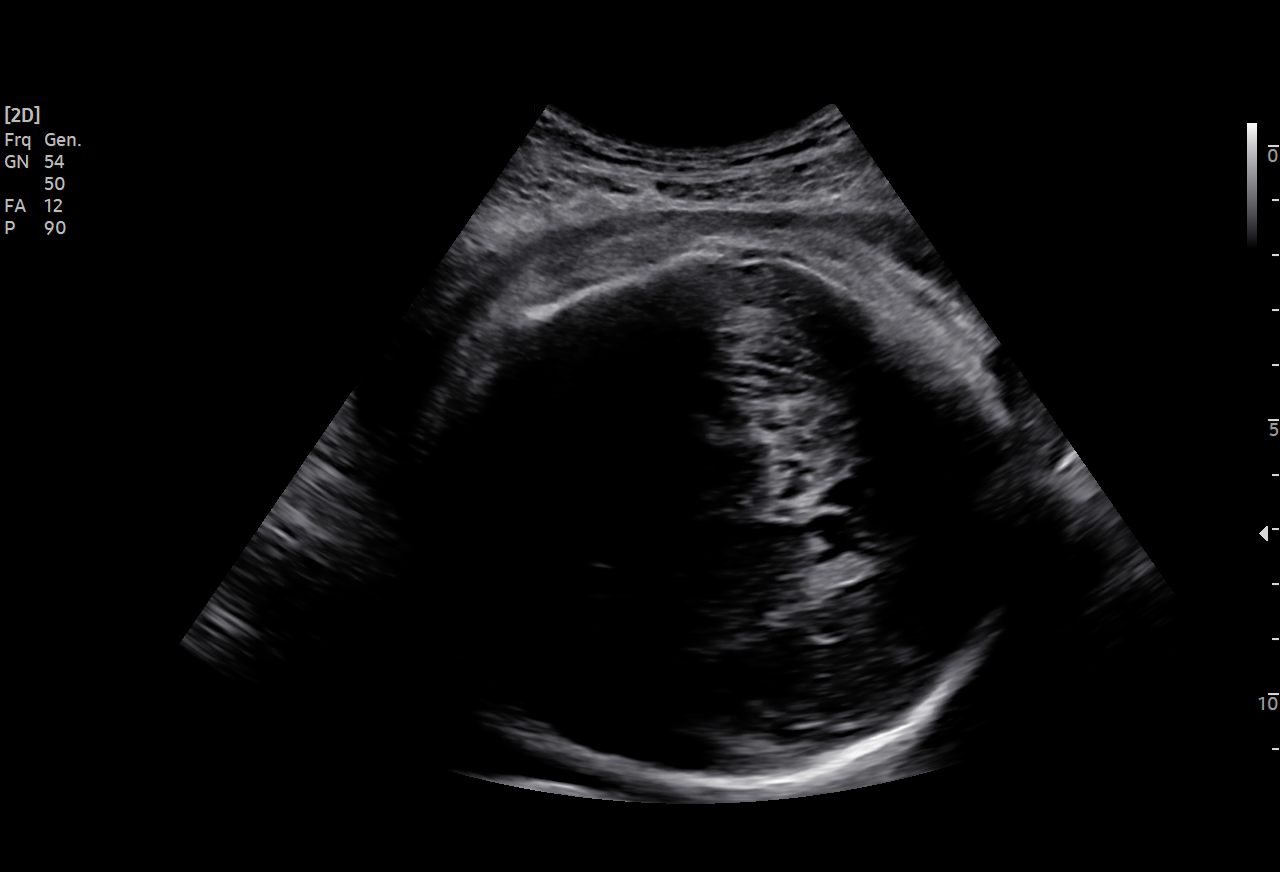
[im 2/5]
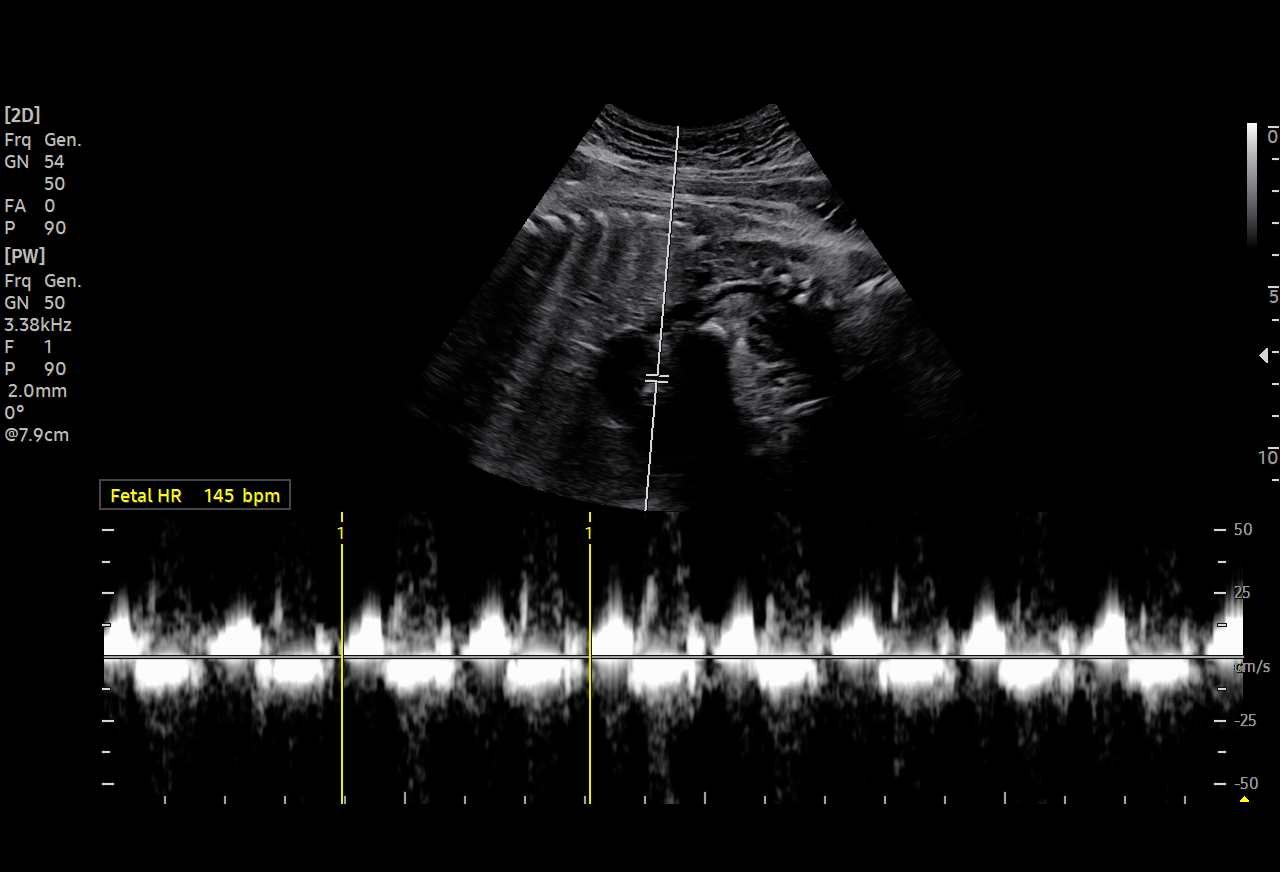
[im 3/5]
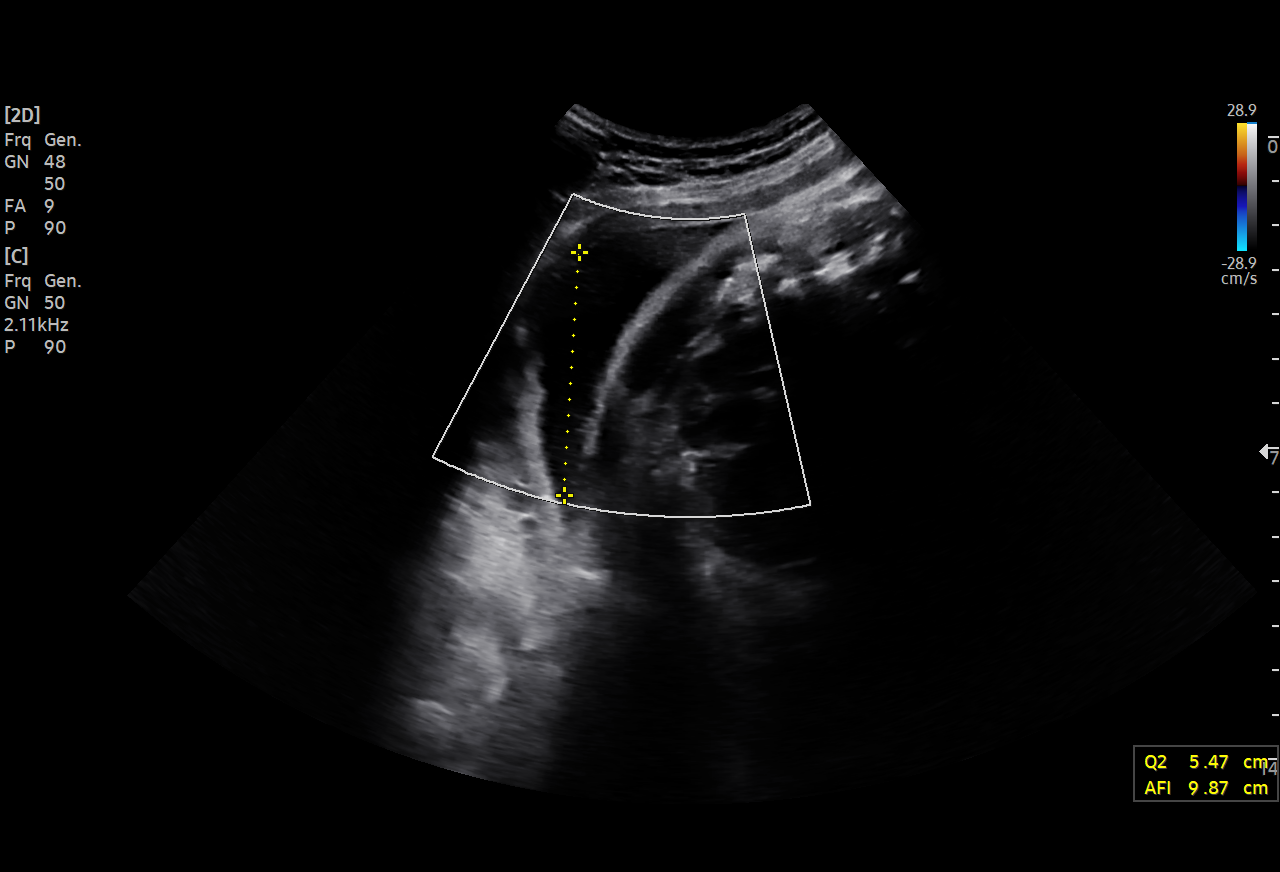
[im 4/5]
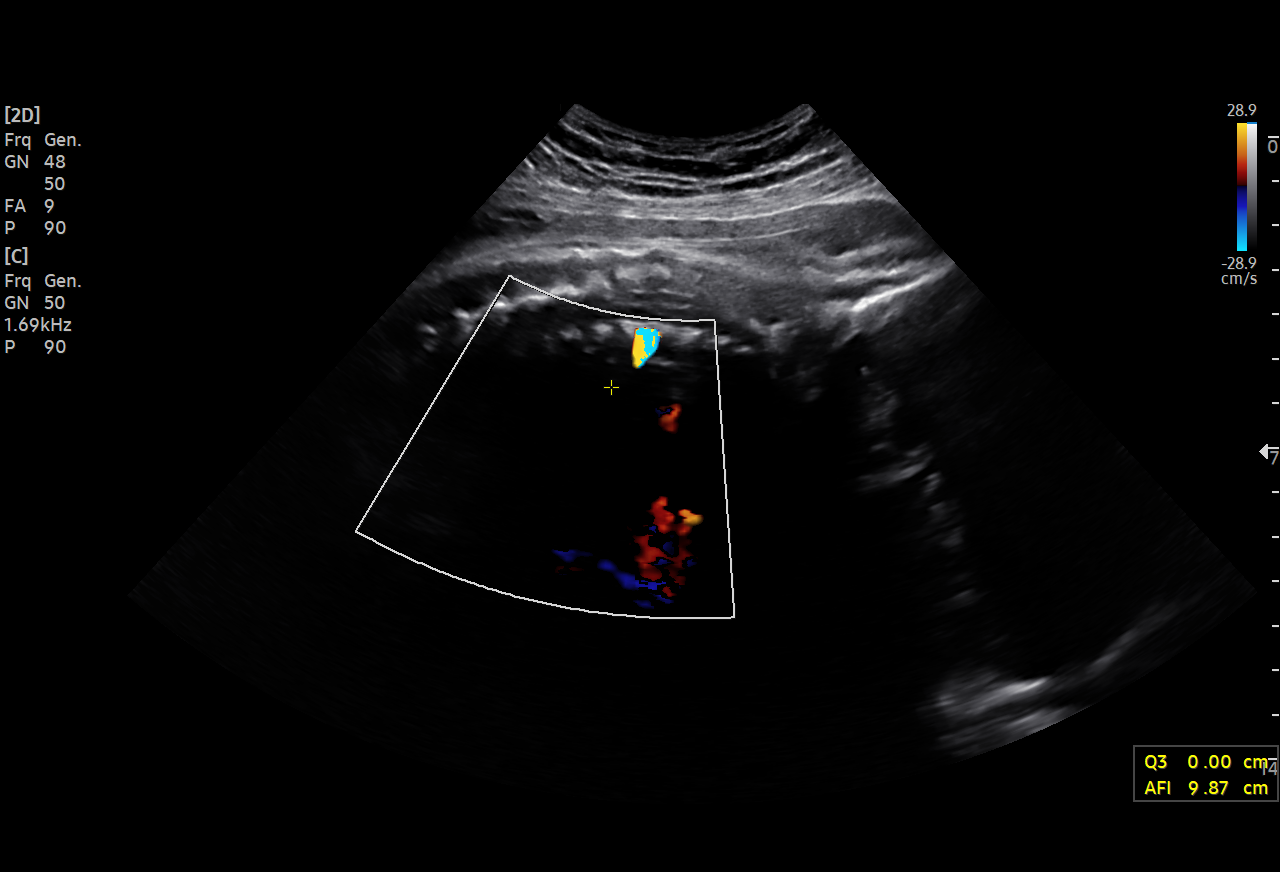
[im 5/5]
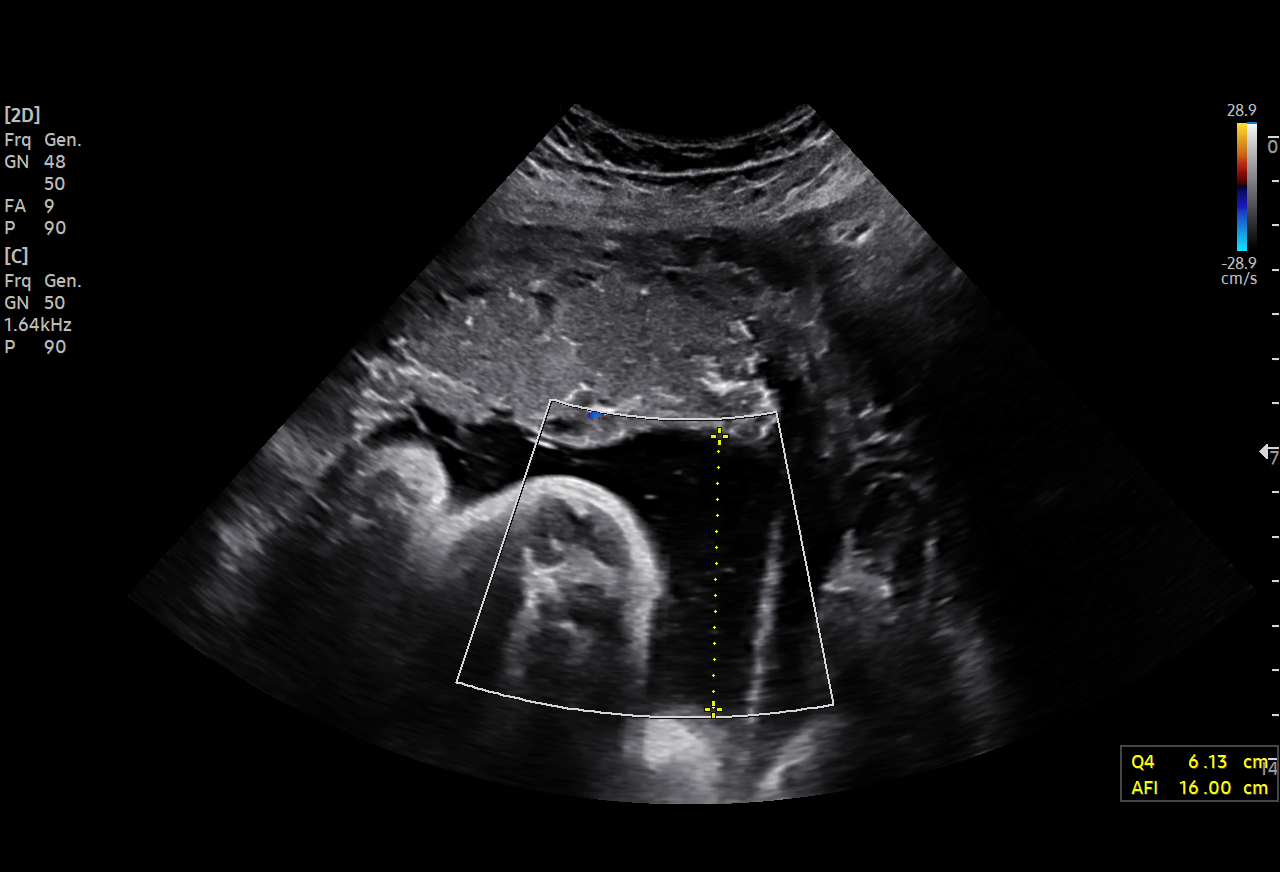

[5 of 5 positions shown; findings below may reference images not displayed]

Healthcare

 1   [HOSPITAL]                        [address].0      MUNAR

Indications

 40 weeks gestation of pregnancy
 Postdate pregnancy (40-42 weeks)                [[place]]
Fetal Evaluation

 Num Of Fetuses:          1
 Fetal Heart Rate(bpm):   145
 Cardiac Activity:        Observed
 Presentation:            Cephalic

 AFI Sum(cm)     %Tile       Largest Pocket(cm)
 16              71

 RUQ(cm)       RLQ(cm)        LUQ(cm)        LLQ(cm)
 4.4           6.13           5.47           0
OB History

 Gravidity:     1
Gestational Age

 LMP:            40w 3d       Date:  [DATE]                   EDD:  [DATE]
 Best:           40w 3d    Det. By:  LMP  ([DATE])            EDD:  [DATE]
Comments

 A limited ultrasound performed today shows that the fetus is in
 the vertex presentation.
 There was normal amniotic fluid noted.
Impression

 Viable fetus in cephalic presentation
                 MUNAR

## 2020-09-18 MED ORDER — TERBUTALINE SULFATE 1 MG/ML IJ SOLN: 0.2500 mg | Freq: Once | INTRAMUSCULAR | Status: DC | PRN

## 2020-09-18 MED ORDER — LIDOCAINE HCL (PF) 1 % IJ SOLN
30.0000 mL | INTRAMUSCULAR | Status: AC | PRN
  Administered 2020-09-19: 30 mL via SUBCUTANEOUS
  Filled 2020-09-18: qty 30

## 2020-09-18 MED ORDER — LEVOTHYROXINE SODIUM 75 MCG PO TABS
75.0000 ug | ORAL_TABLET | Freq: Every day | ORAL | Status: DC
  Filled 2020-09-18 (×2): qty 1

## 2020-09-18 MED ORDER — OXYTOCIN-SODIUM CHLORIDE 30-0.9 UT/500ML-% IV SOLN: 2.5000 [IU]/h | INTRAVENOUS | Status: DC

## 2020-09-18 MED ORDER — SOD CITRATE-CITRIC ACID 500-334 MG/5ML PO SOLN: 30.0000 mL | ORAL | Status: DC | PRN

## 2020-09-18 MED ORDER — ONDANSETRON HCL 4 MG/2ML IJ SOLN
4.0000 mg | Freq: Four times a day (QID) | INTRAMUSCULAR | Status: DC | PRN
  Administered 2020-09-19: 08:00:00 4 mg via INTRAVENOUS
  Filled 2020-09-18: qty 2

## 2020-09-18 MED ORDER — OXYTOCIN BOLUS FROM INFUSION: 333.0000 mL | Freq: Once | INTRAVENOUS | Status: DC

## 2020-09-18 MED ORDER — LACTATED RINGERS IV SOLN: 500.0000 mL | INTRAVENOUS | Status: DC | PRN

## 2020-09-18 MED ORDER — OXYCODONE-ACETAMINOPHEN 5-325 MG PO TABS: 1.0000 | ORAL_TABLET | ORAL | Status: DC | PRN

## 2020-09-18 MED ORDER — ZOLPIDEM TARTRATE 5 MG PO TABS: 5.0000 mg | ORAL_TABLET | Freq: Every evening | ORAL | Status: DC | PRN

## 2020-09-18 MED ORDER — OXYCODONE-ACETAMINOPHEN 5-325 MG PO TABS: 2.0000 | ORAL_TABLET | ORAL | Status: DC | PRN

## 2020-09-18 MED ORDER — MISOPROSTOL 50MCG HALF TABLET
50.0000 ug | ORAL_TABLET | ORAL | Status: DC
  Administered 2020-09-18 (×2): 50 ug via ORAL
  Filled 2020-09-18 (×2): qty 1

## 2020-09-18 MED ORDER — ACETAMINOPHEN 325 MG PO TABS: 650.0000 mg | ORAL_TABLET | ORAL | Status: DC | PRN

## 2020-09-18 MED ORDER — LACTATED RINGERS IV SOLN: INTRAVENOUS | Status: DC

## 2020-09-18 NOTE — Progress Notes (Signed)
Brooke Sherman is a 34 y.o. female G1P0 with IUP at [redacted]w[redacted]d by LMP presenting from the office for nonreactive NST (baseline 180s) followed by reactive NST and AFI 16cm s/p cancellation of elective induction this morning per patient's request. She reports +FMs, No LOF, no VB, no blurry vision, headaches or peripheral edema, and RUQ pain.  She plans on breast feeding.  Subjective: Pt report increased discomfort with UC's- remains tolerable.Discussed plan of care for the evening including risks and benefits of foley balloon placement as well as use of cytotec.    Objective: BP 128/74   Pulse (!) 56   Temp 98.5 F (36.9 C) (Oral)   Resp 18   Ht 5\' 5"  (1.651 m)   Wt 86.4 kg   LMP 12/10/2019   BMI 31.68 kg/m  No intake/output data recorded. No intake/output data recorded.  FHT:  FHR: 140 bpm, variability: moderate,  accelerations:  Present,  decelerations:  Absent UC:   irregular, every 2-7 minutes SVE:   Dilation: 1 Effacement (%): 50 Station: -3 Exam by:: Genevieve, SNM  Labs: Lab Results  Component Value Date   WBC 6.9 09/18/2020   HGB 12.3 09/18/2020   HCT 34.8 (L) 09/18/2020   MCV 84.7 09/18/2020   PLT 182 09/18/2020    Assessment / Plan: #Labor: Induction of labor for nonreactive NST - Cytotec 09/20/2020 buccal x2, foley balloon placed at 2315, reevaluate cervix in 4 hours  #Hypothyroidism: Levothyroxine 75 mcg by mouth daily before breakfast  #Pain: PRN, desires epidural   #FWB: Category I  #ID: GBS Negative  #MOF: Breast  #MOC: POPs   , SNM 09/18/2020, 11:47 PM

## 2020-09-18 NOTE — Progress Notes (Signed)
Pt reports fetal movement with irregular contractions at night. Pt had elective induction scheduled for today but decided that she wants to wait. Pt wants to discuss getting another membrane sweep today.

## 2020-09-18 NOTE — H&P (Addendum)
OBSTETRIC ADMISSION HISTORY AND PHYSICAL  Brooke Sherman is a 34 y.o. female G1P0 with IUP at 31w3dby LMP presenting from the office for nonreactive NST (baseline 180s) followed by reactive NST and AFI 16cm s/p cancellation of elective induction this morning per patient's request. She reports +FMs, No LOF, no VB, no blurry vision, headaches or peripheral edema, and RUQ pain.  She plans on breast feeding. She request POPs for birth control. [facility] --->  Estimated Date of Delivery: 09/15/20  Sono:  07/28/2020  _0 , CWD, normal anatomy, cephalic presentation, stable lie, 2271g, 65% EFW   Prenatal History/Complications:  Hypothyroidism affecting pregnancy (currently taking Levothyroxine 75 mcg by mouth daily before breakfast)  Past Medical History: Past Medical History:  Diagnosis Date  . Hypothyroid     Past Surgical History: Past Surgical History:  Procedure Laterality Date  . ANTERIOR CRUCIATE LIGAMENT REPAIR Left 2019  . WISDOM TOOTH EXTRACTION      Obstetrical History: OB History    Gravida  1   Para      Term      Preterm      AB      Living        SAB      IAB      Ectopic      Multiple      Live Births              Social History Social History   Socioeconomic History  . Marital status: Married    Spouse name: Not on file  . Number of children: Not on file  . Years of education: Not on file  . Highest education level: Not on file  Occupational History  . Not on file  Tobacco Use  . Smoking status: Never Smoker  . Smokeless tobacco: Never Used  Vaping Use  . Vaping Use: Never used  Substance and Sexual Activity  . Alcohol use: Not Currently  . Drug use: Not Currently  . Sexual activity: Yes    Partners: Male    Birth control/protection: None  Other Topics Concern  . Not on file  Social History Narrative  . Not on file   Social Determinants of Health   Financial  Resource Strain: Not on file  Food Insecurity: Not on file  Transportation Needs: Not on file  Physical Activity: Not on file  Stress: Not on file  Social Connections: Not on file    Family History: No family history on file.  Allergies: No Known Allergies  Medications Prior to Admission  Medication Sig Dispense Refill Last Dose  . levothyroxine (SYNTHROID) 75 MCG tablet Take 1 tablet (75 mcg total) by mouth daily before breakfast. 30 tablet 3 09/18/2020 at Unknown time  . Prenatal Vit-Fe Fumarate-FA (PRENATAL VITAMINS PO) Take by mouth.   09/18/2020 at Unknown time  . Blood Pressure Monitor KIT 1 Device by Does not apply route once a week. To be monitored Regularly at home. 1 kit 0   . Misc. Devices (BREAST PUMP) MISC 1 Device by Does not apply route daily. (Patient not taking: Reported on 09/05/2020) 1 each 0   . Misc. Devices (GOJJI WEIGHT SCALE) MISC Use as instructed 1 each 0      Review of Systems   All systems reviewed and negative except as stated in HPI  Last menstrual period 12/10/2019. General appearance: alert, cooperative, appears stated age and no distress Lungs:  clear to auscultation bilaterally Heart: regular rate and rhythm Abdomen: soft, non-tender; bowel sounds normal Extremities: Homans sign is negative, no sign of DVT Presentation: cephalic Fetal monitoringBaseline: 150 bpm, Variability: Good {> 6 bpm), Accelerations: Reactive and Decelerations: Absent Uterine activityNone   Prenatal labs: ABO, Rh: B/Positive/-- (05/04 0000) Antibody: Positive, Negative (05/04 0000) Rubella: Immune (05/04 0000) RPR: Non Reactive (09/13 0858)  HBsAg: Negative (05/04 0000)  HIV: Non Reactive (09/13 0858)  GBS: Negative/-- (11/23 0936)  1 hr Glucose: 129 2 hr Glucose: 91 Genetic screening: AFP- negative Anatomy US -Normal   Prenatal Transfer Tool  Maternal Diabetes: No Genetic Screening: Normal Maternal Ultrasounds/Referrals: Normal Fetal Ultrasounds or other  Referrals:  None Maternal Substance Abuse:  No Significant Maternal Medications:  None Significant Maternal Lab Results: Group B Strep negative  No results found for this or any previous visit (from the past 24 hour(s)).  Patient Active Problem List   Diagnosis Date Noted  . Post term pregnancy over 40 weeks 09/18/2020  . Hypothyroidism affecting pregnancy 05/14/2020  . Encounter for supervision of normal first pregnancy 03/27/2020    Assessment/Plan:  Brooke Sherman is a 35 y.o. G1P0 at 9w3dhere for IOL for nonreactive NST.   #Labor: Induction of labor for nonreactive NST - Cytotec 551m buccal given, reevaluate cervix in 4 hours  #Hypothyroidism: Levothyroxine 75 mcg by mouth daily before breakfast  #Pain: PRN, desires epidural   #FWB: Category I  #ID: GBS Negative  #MOF: Breast  #MOC: POPs  #Circ:  Yes  Brooke GlanceStudent-MidWife  09/18/2020, 6:10 PM  CNM attestation:  I have seen and examined this patient; I agree with above documentation in the student midwife's note.   MeKamarie Venos a [age].o. G1P0 here for postdates IOL  PE: BP 119/77   Pulse 68   Temp 98.6 F (37 C)   Resp 18   Ht _0  (1.651 m)   Wt 86.4 kg   LMP 12/10/2019   BMI 31.68 kg/m  Gen: calm comfortable, NAD Resp: normal effort, no distress Abd: gravid  ROS, labs, PMH reviewed  Plan: -Admit to Labor and Delivery -Plan cx ripening with cytotec/cervical foley followed by Pitocin/AROM as needed -Continue home Synthroid 754m-Anticipate vag del  Brooke Sherman 09/18/2020, 9:08 PM

## 2020-09-18 NOTE — Patient Instructions (Addendum)
Maternity Assessment Unit (MAU)  The Maternity Assessment Unit (MAU) is located at the [facility] at [facility]. The address is: [address] [place], [place], [place] [address]. Please see map below for additional directions.    The Maternity Assessment Unit is designed to help you during your pregnancy, and for up to 6 weeks after delivery, with any pregnancy- or postpartum-related emergencies, if you think you are in labor, or if your water has broken. For example, if you experience nausea and vomiting, vaginal bleeding, severe abdominal or pelvic pain, elevated blood pressure or other problems related to your pregnancy or postpartum time, please come to the Maternity Assessment Unit for assistance.        Cervical Ripening (to get your cervix ready for labor) : May try one or all:  Red Raspberry Leaf capsules:  two 300mg  or 400mg  tablets with each meal, 2-3 times a day  Potential Side Effects Of Raspberry Leaf:  Most women do not experience any side effects from drinking raspberry leaf tea. However, nausea and loose stools are possible     Evening Primrose Oil capsules: may take 1 to 3 capsules daily. May also prick one to release the oil and insert it into your vagina at night.  Some of the potential side effects:  Upset stomach  Loose stools or diarrhea  Headaches  Nausea   4 Dates a day (may taste better if warmed in microwave until soft). Found where raisins are in the grocery store       The MilesCircuit  This circuit takes at least 90 minutes to complete so clear your schedule and make mental preparations so you can relax in your environment. The second step requires a lot of pillows so gather them up before beginning Before starting, you should empty your bladder! Have a nice drink nearby, and make sure it has a straw! If you are having contractions, this circuit should be done through contractions, try not to change  positions between steps Before you begin...  "I named this 'circuit' after my friend , who shared and discussed it with me when I was working with a client whose labor seemed to be stalled out and no longer progressing... This circuit is useful to help get the baby lined up, ideally, in the "Left Occiput Anterior" (LOA) Position, both before labor begins and when some corrections need to be done during labor. Prenatally, this position set can help to rotate a baby. As a natural method of induction, this can help get things going if baby just needed a gentle nudge of position to set things off. To the best of my knowledge, this group of positions will not "hurt" a baby that is already lined up correctly." -   Step One: Open-knee Chest Stay in this position for 30 minutes, start in cat/cow, then drop your chest as low as you can to the bed or the floor and your bottom as high as you can. Knees should be fairly wide apart, and the angle between the torso/thighs should be wider than 90 degrees. Wiggle around, prop with lots of pillows and use this time to get totally relaxed. This position allows the baby to scoot out of the pelvis a bit and gives them room to rotate, shift their head position, etc. If the pregnant person finds it helpful, careful positioning with a rebozo under the belly, with gentle tension from a support person behind can help maintain this  position for the full 30 minutes.  Step OVF:IEPPIRJJOAC Left Side Lying Roll to your left side, bringing your top leg as high as possible and keeping your bottom leg straight. Roll forward as much as possible, again using a lot of pillows. Sink into the bed and relax some more. If you fall asleep, that's totally okay and you can stay there! If not, stay here for at least another half an hour. Try and get your top right leg up towards your head and get as rolled over onto your belly as much as possible.  If you repeat the circuit during labor, try alternating left and right sides. We know the photo the left is actually right side... just flip the image in your head.  Step Three: Moving and Lunges Lunge, walk stairs facing sideways, 2 at a time, (have a spotter downstairs of you!), take a walk outside with one foot on the curb and the other on the street, sit on a birth ball and hula- anything that's upright and putting your pelvis in open, asymmetrical positions. Spend at least 30 minutes doing this one as well to give your baby a chance to move down. If you are lunging or stair or curb walking, you should lunge/walk/go up stairs in the direction that feels better to you. The key with the lunge is that the toes of the higher leg and mom's belly button should be at right angles. Do not lunge over your knee, that closes the pelvis.     Megan Hamilton Miles: Tourist information centre manager - [email or web address] Sharlyn Bologna, CD, BDT (DONA), LCCE, FACCE: Supporting Content - [email or web address] Rulon Eisenmenger: Photography - [email or web address] Luther Hearing CD/CDT [facility]): Print and Webmaster - [email or web address] MilesCircuit [employer] [email or web address]

## 2020-09-18 NOTE — Progress Notes (Signed)
Subjective:  Brooke Sherman is a 34 y.o. G1P0 at [redacted]w[redacted]d being seen today for ongoing prenatal care.  She is currently monitored for the following issues for this low-risk pregnancy and has Encounter for supervision of normal first pregnancy and Hypothyroidism affecting pregnancy on their problem list.  Patient reports no complaints.  Contractions: Irregular. Vag. Bleeding: None.  Movement: Present. Denies leaking of fluid.   The following portions of the patient's history were reviewed and updated as appropriate: allergies, current medications, past family history, past medical history, past social history, past surgical history and problem list. Problem list updated.  Objective:   Vitals:   09/18/20 1332  BP: 130/83  Pulse: 69  Weight: 187 lb 12.8 oz (85.2 kg)    Fetal Status: Fetal Heart Rate (bpm): NST   Movement: Present     General:  Alert, oriented and cooperative. Patient is in no acute distress.  Skin: Skin is warm and dry. No rash noted.   Cardiovascular: Normal heart rate noted  Respiratory: Normal respiratory effort, no problems with respiration noted  Abdomen: Soft, gravid, appropriate for gestational age. Pain/Pressure: Present     Pelvic: Vag. Bleeding: None     Cervical exam performed        Extremities: Normal range of motion.  Edema: None  Mental Status: Normal mood and affect. Normal behavior. Normal judgment and thought content.   Urinalysis:      Assessment and Plan:  Pregnancy: G1P0 at [redacted]w[redacted]d  1. Encounter for supervision of normal first pregnancy in third trimester -pt had elective induction scheduled for today but cancelled after speaking with K. Clelia Croft, CNM and is here today with husband for NST -discussed ways to stimulate labor, information on cervical ripening given --Reviewed labor readiness with patient including the [employer], evening primrose oil, and raspberry leaf tea.  -initial NST showed baseline of 180 for 27 minutes. Possible  discrepancy present between paper tracing and reading on machine. AFI performed 16cm fluid. Repeat NST with baseline of 150, moderate variability, accels present 15x15, absent decelerations, irregular ctx -consulted with Dr. Jolayne Panther who recommends patient be directly admitted to L&D for delivery -called and notified Dr. Crissie Reese of direct admission, accepts patient  2. Hypothyroidism affecting pregnancy in third trimester  Term labor symptoms and general obstetric precautions including but not limited to vaginal bleeding, contractions, leaking of fluid and fetal movement were reviewed in detail with the patient. I discussed the assessment and treatment plan with the patient. The patient was provided an opportunity to ask questions and all were answered. The patient agreed with the plan and demonstrated an understanding of the instructions. The patient was advised to call back or seek an in-person office evaluation/go to MAU at [facility] for any urgent or concerning symptoms. Please refer to After Visit Summary for other counseling recommendations.  Return for to hospital for IOL.   Crystin Lechtenberg, Odie Sera, NP

## 2020-09-19 ENCOUNTER — Encounter (HOSPITAL_COMMUNITY): Payer: 59

## 2020-09-19 ENCOUNTER — Inpatient Hospital Stay (HOSPITAL_COMMUNITY): Payer: 59 | Admitting: Anesthesiology

## 2020-09-19 ENCOUNTER — Encounter (HOSPITAL_COMMUNITY): Payer: Self-pay | Admitting: Family Medicine

## 2020-09-19 ENCOUNTER — Encounter: Payer: 59 | Admitting: Obstetrics and Gynecology

## 2020-09-19 DIAGNOSIS — Z3A40 40 weeks gestation of pregnancy: Secondary | ICD-10-CM

## 2020-09-19 DIAGNOSIS — O480 Post-term pregnancy: Secondary | ICD-10-CM

## 2020-09-19 LAB — RPR: RPR Ser Ql: NONREACTIVE

## 2020-09-19 MED ORDER — MEDROXYPROGESTERONE ACETATE 150 MG/ML IM SUSP: 150.0000 mg | INTRAMUSCULAR | Status: DC | PRN

## 2020-09-19 MED ORDER — PRENATAL MULTIVITAMIN CH
1.0000 | ORAL_TABLET | Freq: Every day | ORAL | Status: DC
  Administered 2020-09-20: 12:00:00 1 via ORAL
  Filled 2020-09-19: qty 1

## 2020-09-19 MED ORDER — WITCH HAZEL-GLYCERIN EX PADS: 1.0000 "application " | MEDICATED_PAD | CUTANEOUS | Status: DC | PRN

## 2020-09-19 MED ORDER — COCONUT OIL OIL: 1.0000 "application " | TOPICAL_OIL | Status: DC | PRN

## 2020-09-19 MED ORDER — SENNOSIDES-DOCUSATE SODIUM 8.6-50 MG PO TABS
2.0000 | ORAL_TABLET | ORAL | Status: DC
  Administered 2020-09-19: 19:00:00 2 via ORAL
  Filled 2020-09-19: qty 2

## 2020-09-19 MED ORDER — TETANUS-DIPHTH-ACELL PERTUSSIS 5-2.5-18.5 LF-MCG/0.5 IM SUSY: 0.5000 mL | PREFILLED_SYRINGE | Freq: Once | INTRAMUSCULAR | Status: DC

## 2020-09-19 MED ORDER — DIBUCAINE (PERIANAL) 1 % EX OINT: 1.0000 "application " | TOPICAL_OINTMENT | CUTANEOUS | Status: DC | PRN

## 2020-09-19 MED ORDER — FENTANYL-BUPIVACAINE-NACL 0.5-0.125-0.9 MG/250ML-% EP SOLN
12.0000 mL/h | EPIDURAL | Status: DC | PRN
  Filled 2020-09-19: qty 250

## 2020-09-19 MED ORDER — SIMETHICONE 80 MG PO CHEW: 80.0000 mg | CHEWABLE_TABLET | ORAL | Status: DC | PRN

## 2020-09-19 MED ORDER — ONDANSETRON HCL 4 MG/2ML IJ SOLN
4.0000 mg | INTRAMUSCULAR | Status: DC | PRN
Start: 2020-09-19 — End: 2020-09-20

## 2020-09-19 MED ORDER — DIPHENHYDRAMINE HCL 50 MG/ML IJ SOLN: 12.5000 mg | INTRAMUSCULAR | Status: DC | PRN

## 2020-09-19 MED ORDER — OXYTOCIN-SODIUM CHLORIDE 30-0.9 UT/500ML-% IV SOLN
1.0000 m[IU]/min | INTRAVENOUS | Status: DC
  Administered 2020-09-19: 12:00:00 2 m[IU]/min via INTRAVENOUS
  Filled 2020-09-19: qty 500

## 2020-09-19 MED ORDER — SODIUM CHLORIDE (PF) 0.9 % IJ SOLN
INTRAMUSCULAR | Status: DC | PRN
  Administered 2020-09-19: 12 mL/h via EPIDURAL

## 2020-09-19 MED ORDER — EPHEDRINE 5 MG/ML INJ: 10.0000 mg | INTRAVENOUS | Status: DC | PRN

## 2020-09-19 MED ORDER — LACTATED RINGERS IV SOLN: 500.0000 mL | Freq: Once | INTRAVENOUS | Status: DC

## 2020-09-19 MED ORDER — PHENYLEPHRINE 40 MCG/ML (10ML) SYRINGE FOR IV PUSH (FOR BLOOD PRESSURE SUPPORT)
80.0000 ug | PREFILLED_SYRINGE | INTRAVENOUS | Status: DC | PRN
  Filled 2020-09-19: qty 10

## 2020-09-19 MED ORDER — IBUPROFEN 600 MG PO TABS
600.0000 mg | ORAL_TABLET | Freq: Four times a day (QID) | ORAL | Status: DC
  Administered 2020-09-19 – 2020-09-20 (×4): 600 mg via ORAL
  Filled 2020-09-19 (×4): qty 1

## 2020-09-19 MED ORDER — MEASLES, MUMPS & RUBELLA VAC IJ SOLR: 0.5000 mL | Freq: Once | INTRAMUSCULAR | Status: DC

## 2020-09-19 MED ORDER — DIPHENHYDRAMINE HCL 25 MG PO CAPS: 25.0000 mg | ORAL_CAPSULE | Freq: Four times a day (QID) | ORAL | Status: DC | PRN

## 2020-09-19 MED ORDER — ACETAMINOPHEN 325 MG PO TABS
650.0000 mg | ORAL_TABLET | ORAL | Status: DC | PRN
  Administered 2020-09-19 – 2020-09-20 (×3): 650 mg via ORAL
  Filled 2020-09-19 (×3): qty 2

## 2020-09-19 MED ORDER — FERROUS SULFATE 325 (65 FE) MG PO TABS
325.0000 mg | ORAL_TABLET | ORAL | Status: DC
  Administered 2020-09-19: 23:00:00 325 mg via ORAL
  Filled 2020-09-19: qty 1

## 2020-09-19 MED ORDER — PHENYLEPHRINE 40 MCG/ML (10ML) SYRINGE FOR IV PUSH (FOR BLOOD PRESSURE SUPPORT)
80.0000 ug | PREFILLED_SYRINGE | INTRAVENOUS | Status: DC | PRN
  Administered 2020-09-19: 04:00:00 80 ug via INTRAVENOUS

## 2020-09-19 MED ORDER — LIDOCAINE HCL (PF) 1 % IJ SOLN
INTRAMUSCULAR | Status: DC | PRN
  Administered 2020-09-19: 10 mL via EPIDURAL

## 2020-09-19 MED ORDER — BENZOCAINE-MENTHOL 20-0.5 % EX AERO
1.0000 | INHALATION_SPRAY | CUTANEOUS | Status: DC | PRN
Start: 2020-09-19 — End: 2020-09-20
  Administered 2020-09-19: 1 via TOPICAL
  Filled 2020-09-19: qty 56

## 2020-09-19 MED ORDER — ONDANSETRON HCL 4 MG PO TABS: 4.0000 mg | ORAL_TABLET | ORAL | Status: DC | PRN

## 2020-09-19 NOTE — Plan of Care (Signed)
Daysi Boggan, RN 

## 2020-09-19 NOTE — Progress Notes (Signed)
Patient ID: Brooke Sherman, female   DOB: 1986/06/08, 34 y.o.   MRN: [id number]  S/p cervical foley at ~0300 and cytotec x 2 doses; comfortable with epidural   BPs 95/48 FHR 140-150s, +accels, occ variables Ctx q 2-5 mins Cx 6/80/vtx -1; AROM for mod MSF  IUP@40 .4wks Early active labor  Plan to check cx in 2+hrs or sooner prn If no cx change, will start Pitocin Anticipate vag del  Brooke Sherman [facility] 09/19/2020 5:40 AM

## 2020-09-19 NOTE — Anesthesia Procedure Notes (Signed)
Epidural Patient location during procedure: OB Start time: 09/19/2020 2:50 AM End time: 09/19/2020 3:00 AM  Staffing Anesthesiologist: Mellody Dance, MD Performed: anesthesiologist   Preanesthetic Checklist Completed: patient identified, IV checked, site marked, risks and benefits discussed, monitors and equipment checked, pre-op evaluation and timeout performed  Epidural Patient position: sitting Prep: DuraPrep Patient monitoring: heart rate, cardiac monitor, continuous pulse ox and blood pressure Approach: midline Location: L3-L4 Injection technique: LOR saline  Needle:  Needle type: Tuohy  Needle gauge: 17 G Needle length: 9 cm Needle insertion depth: 4 cm Catheter type: closed end flexible Catheter size: 20 Guage Catheter at skin depth: 9 cm Test dose: negative and Other  Assessment Events: blood not aspirated, injection not painful, no injection resistance and negative IV test  Additional Notes Informed consent obtained prior to proceeding including risk of failure, 1% risk of PDPH, risk of minor discomfort and bruising.  Discussed rare but serious complications including epidural abscess, permanent nerve injury, epidural hematoma.  Discussed alternatives to epidural analgesia and patient desires to proceed.  Timeout performed pre-procedure verifying patient name, procedure, and platelet count.  Patient tolerated procedure well.

## 2020-09-19 NOTE — Progress Notes (Signed)
Brooke Sherman a 34 y.o.femaleG1P0 with IUP at [redacted]w[redacted]d by LMPpresenting from the officefor nonreactive NST(baseline 180s) followed by reactive NST and AFI 16cms/p cancellation of elective induction this morning per patient's request.   Subjective: Status post epidural, pt reports contractions are feeling much better after epidural.  Denies pressure at this time  Objective: BP 111/63   Pulse (!) 58   Temp 98.3 F (36.8 C) (Oral)   Resp 18   Ht 5\' 5"  (1.651 m)   Wt 86.4 kg   LMP 12/10/2019   SpO2 93%   BMI 31.68 kg/m  No intake/output data recorded. No intake/output data recorded.  FHT:  FHR: 150 bpm, variability: moderate,  accelerations:  Abscent,  decelerations:  Absent UC:   irregular, every 3-6 minutes SVE:   Dilation: 6 Effacement (%): 60,70 Station: -2 Exam by:: [email or web address], RN & Mellody Dance, RN  Labs: Lab Results  Component Value Date   WBC 6.9 09/18/2020   HGB 12.3 09/18/2020   HCT 34.8 (L) 09/18/2020   MCV 84.7 09/18/2020   PLT 182 09/18/2020    Assessment / Plan: #IOL: cytotec x2, foley balloon out at 0100, will assess cervix after patient comfortable post epidural #Pain: Epidural #FWB: cat 1 #MOF: Breast #MOC: Pops  Buford Bremer L Brandice Busser, SNM 09/19/2020, 4:30 AM

## 2020-09-19 NOTE — Lactation Note (Signed)
This note was copied from a baby's chart. Lactation Consultation Note  Patient Name: Brooke Sherman INOMV'E Date: 09/19/2020 Reason for consult: Initial assessment;Primapara   P1 mom.  Hx of hypothyroid; on synthroid.    Infant was wrapped in blankets.  Mom asked for help with latch.  LC described hand expression and showed mom illustrations in book.  Mom was able to demonstrate hand expression and drops easily came to the breast.  Infant was placed STS in laid back position and latched with LC helping to support infant's head.    Infant fed for 5 minutes then came off when LC adjusted position.  Nipple was rounded.  Infant latched again and mom and dad were able to hear a couple of swallows.  Lips were flanged and cheeks were flush to breast.  Infant actively fed for 20 minutes.  LC reviewed STS, feeding on demand, (at least 8-12 times in 24 hours), pacifier use, and encouraged mom to hand express prior to feeding.    Brochure reviewed and pt. Is aware of support groups, phone line, and OP appt. Services.     Maternal Data Has patient been taught Hand Expression?: Yes Does the patient have breastfeeding experience prior to this delivery?: No  Feeding Feeding Type: Breast Fed  LATCH Score Latch: Grasps breast easily, tongue down, lips flanged, rhythmical sucking.  Audible Swallowing: A few with stimulation  Type of Nipple: Everted at rest and after stimulation  Comfort (Breast/Nipple): Soft / non-tender  Hold (Positioning): Assistance needed to correctly position infant at breast and maintain latch.  LATCH Score: 8  Interventions Interventions: Breast feeding basics reviewed;Assisted with latch;Skin to skin;Breast massage;Hand express;Position options;Support pillows;Adjust position;Breast compression  Lactation Tools Discussed/Used     Consult Status Consult Status: Follow-up Date: 09/20/20 Follow-up type: In-patient    Maryruth Hancock [facility] 09/19/2020, 8:25  PM

## 2020-09-19 NOTE — Progress Notes (Signed)
Labor Progress Note Brooke Sherman is a 34 y.o. G1P0 at [redacted]w[redacted]d presented for IOL for NRFHT  S:  Went to room with [name], SNM after early variable decel. RN at bedside repositioning pt into left lateral with peanut ball. Pt comfortable with epidural, not feeling contractions or pressure. FOB at bedside for support. Pt amenable to starting Pitocin and/or use of IUPC if needed.  O:  BP 111/74   Pulse 60   Temp 98.3 F (36.8 C) (Oral)   Resp 16   Ht 5\' 5"  (1.651 m)   Wt 190 lb 6.4 oz (86.4 kg)   LMP 12/10/2019   SpO2 93%   BMI 31.68 kg/m  EFM: baseline 130 bpm/ moderate variability/ + accels/ variable early decels if pt laying on right or left side, none if sitting up in throne position Toco/IUPC: q2-33min SVE: Dilation: 6.5 Effacement (%): 80 Station: -1 Presentation: Vertex Exam by:: [email or web address], RN Pitocin: None, to start titration if FHR remains stable in throne position  A/P: 34 y.o. G1P0 [redacted]w[redacted]d  1. Labor: Active 2. FWB: Cat 1 with proper positioning 3. Pain: well controlled with epidural 4. To start Pitocin titration, will place IUPC for amnioinfusion if early variables become repetitive.  Anticipate SVD.  [redacted]w[redacted]d, CNM 9:13 AM

## 2020-09-19 NOTE — Progress Notes (Signed)
Brooke Sherman is a 34 y.o. G1P0 at [redacted]w[redacted]d by LMP admitted for induction of labor due to Non-reactive NST.  Subjective: Patient reports feeling increased pressure with contractions, but no pain; epidural working well.   Objective: BP 115/77   Pulse (!) 57   Temp 98.7 F (37.1 C) (Oral)   Resp 16   Ht 5\' 5"  (1.651 m)   Wt 86.4 kg   LMP 12/10/2019   SpO2 93%   BMI 31.68 kg/m  I/O last 3 completed shifts: In: -  Out: 600 [Urine:600] No intake/output data recorded.  FHT:  FHR: 135 bpm, variability: moderate,  accelerations:  Present,  decelerations:  Present variable UC:   regular, every 2-4 minutes SVE:   Dilation: 10 Effacement (%): 100 Station: Plus 1 Exam by:: [email or web address], RN  Labs: Lab Results  Component Value Date   WBC 6.9 09/18/2020   HGB 12.3 09/18/2020   HCT 34.8 (L) 09/18/2020   MCV 84.7 09/18/2020   PLT 182 09/18/2020    Assessment / Plan: Induction of labor due to nonreactive NST,  progressing well on pitocin  Labor: Patient complete and pushing, Pitocin started due to irregular contraction pattern and strength. After pushing with the patient for 1 hour, patient encouraged to labor down for 30 minutes on her left side with peanut ball in place between her ankles and knees together. Will continue to monitor for change in fetal decent.  Preeclampsia:  n/a Fetal Wellbeing:  Category I Pain Control:  Epidural I/D:  GBS negative Anticipated MOD:  NSVD  09/20/2020 09/19/2020, 12:37 PM

## 2020-09-19 NOTE — Discharge Summary (Addendum)
   Postpartum Discharge Summary    Patient Name: Brooke Sherman DOB: 07/16/1986 MRN: [id number]  Date of admission: 09/18/2020 Delivery date:09/19/2020  Delivering provider: WALKER, JAMILLA R  (Jasmine Bates, SNM) Date of discharge: 09/20/2020  Admitting diagnosis: Post term pregnancy over 40 weeks [O48.0] Intrauterine pregnancy: [redacted]w[redacted]d     Secondary diagnosis:  Active Problems:   Post term pregnancy over 40 weeks   Postpartum care following vaginal delivery  Additional problems: NONE    Discharge diagnosis: Term Pregnancy Delivered                                              Post partum procedures:none Augmentation: AROM, Pitocin, Cytotec and IP Foley Complications: None  Hospital course: Induction of Labor With Vaginal Delivery   34 y.o. yo G1P1001 at [redacted]w[redacted]d was admitted to the hospital 09/18/2020 for induction of labor.  Indication for induction: nonreactive NST.  Patient had an uncomplicated labor course as follows: Membrane Rupture Time/Date: 5:25 AM ,09/19/2020   Delivery Method:Vaginal, Spontaneous  Episiotomy: None  Lacerations:  2nd degree;Sulcus  Details of delivery can be found in separate delivery note.  Patient had a routine postpartum course. Patient is discharged home 09/20/20.  Newborn Data: Birth date:09/19/2020  Birth time:3:28 PM  Gender:Female  Living status:Living  Apgars:9 ,9  Weight:3555 g   Magnesium Sulfate received: No BMZ received: No Rhophylac:N/A MMR:No T-DaP:Given prenatally Flu: Yes Transfusion:No  Physical exam  Vitals:   09/19/20 1845 09/19/20 2255 09/20/20 0248 09/20/20 0623  BP: 133/74 126/68 123/75 123/72  Pulse: 61 60 (!) 57   Resp: 18 18 18 16  Temp: 98.2 F (36.8 C) 98.4 F (36.9 C) 98.2 F (36.8 C) 98.2 F (36.8 C)  TempSrc: Oral Oral Oral Oral  SpO2:  97%  100%  Weight:      Height:       General: Doing well, no apparent distress Lochia: appropriate Uterine Fundus: firm Incision: N/A DVT Evaluation:  negative Homans Labs: Lab Results  Component Value Date   WBC 6.9 09/18/2020   HGB 12.3 09/18/2020   HCT 34.8 (L) 09/18/2020   MCV 84.7 09/18/2020   PLT 182 09/18/2020   No flowsheet data found. Edinburgh Score: No flowsheet data found.   After visit meds:  Allergies as of 09/20/2020   No Known Allergies     Medication List    TAKE these medications   Blood Pressure Monitor Kit 1 Device by Does not apply route once a week. To be monitored Regularly at home.   Gojji Weight Scale Misc Use as instructed   Breast Pump Misc 1 Device by Does not apply route daily.   ibuprofen 600 MG tablet Commonly known as: ADVIL Take 1 tablet (600 mg total) by mouth every 6 (six) hours.   levothyroxine 75 MCG tablet Commonly known as: SYNTHROID Take 1 tablet (75 mcg total) by mouth daily before breakfast.   norethindrone 0.35 MG tablet Commonly known as: Ortho Micronor Take 1 tablet (0.35 mg total) by mouth daily.   PRENATAL VITAMINS PO Take by mouth.        Discharge home in stable condition Infant Feeding: Breast Infant Disposition:home with mother Discharge instruction: per After Visit Summary and Postpartum booklet. Activity: Advance as tolerated. Pelvic rest for 6 weeks.  Diet: routine diet Future Appointments:No future appointments. Follow up Visit:  Follow-up Information      [facility] Follow up.   Specialty: Obstetrics and Gynecology Contact information: [address], [address] [place] [address] [phone]             4 weeks at [facility]   Please schedule this patient for a Virtual postpartum visit in 4 weeks with the following provider: Any provider. Additional Postpartum F/U:None  Low risk pregnancy complicated by: Hypothyroidism Delivery mode:  Vaginal, Spontaneous  Anticipated Birth Control:  POPs    

## 2020-09-19 NOTE — Anesthesia Preprocedure Evaluation (Signed)
Anesthesia Evaluation  Patient identified by MRN, date of birth, ID band Patient awake    Reviewed: Allergy & Precautions, H&P , NPO status , Patient's Chart, lab work & pertinent test results  Airway Mallampati: II  TM Distance: >3 FB Neck ROM: Full    Dental no notable dental hx.    Pulmonary neg pulmonary ROS,    Pulmonary exam normal breath sounds clear to auscultation       Cardiovascular negative cardio ROS Normal cardiovascular exam Rhythm:Regular Rate:Normal     Neuro/Psych negative neurological ROS  negative psych ROS   GI/Hepatic negative GI ROS, Neg liver ROS,   Endo/Other  Hypothyroidism   Renal/GU negative Renal ROS  negative genitourinary   Musculoskeletal negative musculoskeletal ROS (+)   Abdominal   Peds negative pediatric ROS (+)  Hematology negative hematology ROS (+)   Anesthesia Other Findings   Reproductive/Obstetrics negative OB ROS                             Anesthesia Physical Anesthesia Plan  ASA: II  Anesthesia Plan: Epidural   Post-op Pain Management:    Induction:   PONV Risk Score and Plan:   Airway Management Planned:   Additional Equipment:   Intra-op Plan:   Post-operative Plan:   Informed Consent: I have reviewed the patients History and Physical, chart, labs and discussed the procedure including the risks, benefits and alternatives for the proposed anesthesia with the patient or authorized representative who has indicated his/her understanding and acceptance.       Plan Discussed with: Anesthesiologist  Anesthesia Plan Comments:         Anesthesia Quick Evaluation

## 2020-09-20 MED ORDER — NORETHINDRONE 0.35 MG PO TABS: 1.0000 | ORAL_TABLET | Freq: Every day | ORAL | 11 refills | Status: DC

## 2020-09-20 MED ORDER — LEVOTHYROXINE SODIUM 75 MCG PO TABS
75.0000 ug | ORAL_TABLET | Freq: Every day | ORAL | Status: DC
  Administered 2020-09-20: 14:00:00 75 ug via ORAL
  Filled 2020-09-20: qty 1

## 2020-09-20 MED ORDER — IBUPROFEN 600 MG PO TABS: 600.0000 mg | ORAL_TABLET | Freq: Four times a day (QID) | ORAL | 0 refills | Status: DC

## 2020-09-20 NOTE — Lactation Note (Signed)
This note was copied from a baby's chart. Lactation Consultation Note  Patient Name: Boy Sherronda Sweigert TDHRC'B Date: 09/20/2020   Infant is 21 hrs old. Infant just finished feeding & was sleeping contentedly on Mom's chest. Mom has no breast complaints, but admits to having some initial discomfort with latch, which then resolves. Mom feels tugging with latching. Mom says that her nipples are typically rounded when "Isaiah" releases latch. Mom believes she is hearing swallows (I tried to explain/mimic the sound of swallows for her).  Mom reported  + breast changes w/pregnancy (her bra size increased by 2 cup sizes).   Specifics of an asymmetric latch were shown via [employer] b/c Mom had been told she needed to get in all/most of her areola. Mom was thankful for the explanation.   Infant is content after feedings; signs of satiety (relaxed arm) were discussed; parents were educated about initial newborn behavior (sleepy during the 1st 24 hrs, cluster feeding tonight/tomorrow); and basic breast management was reviewed.  Parents know how to reach [place] for post-discharge questions Remigio Eisenmenger 09/20/2020, 1:05 PM

## 2020-09-20 NOTE — Discharge Instructions (Signed)
Breastfeeding and Self-Care It is normal to have some problems when you start to breastfeed your new baby. But there are things that you can do to take care of yourself and help prevent many common problems. This includes keeping your breasts healthy and making sure that your baby's mouth attaches (latches) properly to your nipple for feedings. Work with your doctor or breastfeeding specialist (lactation consultant) to find what works best for you. Follow these instructions at home: Breastfeeding strategy   Always make sure that your baby latches properly to breastfeed.  Make sure that your baby is in a proper position. Try different breastfeeding positions to find one that works best for you and your baby.  Breastfeed when you feel like you need to make your breasts less full or when your baby shows signs of hunger. This is called "breastfeeding on demand."  Do not delay feedings.  Try to relax when it is time to feed your baby. This helps your body release milk from your breast.  To help increase milk flow: ? Remove a small amount of milk from your breast right before breastfeeding. Do this using a pump or by squeezing with your hand. ? Apply warm, moist heat to your breast right before feeding. You can do this in the shower or with hand towels soaked with warm water. ? Massage your breast right before or during feeding. Breast care   To help your breasts stay healthy and keep them from getting too dry: ? Avoid using soap on your nipples. ? Let your nipples air-dry for 3-4 minutes after each feeding. ? Use only cotton bra pads to soak up breast milk that leaks. Be sure to change the pads if they become soaked with milk. If you use bra pads that can be thrown away, change them often. ? Put some lanolin on your nipples after breastfeeding. Pure lanolin does not need to be washed off your nipple before you feed your baby again. Pure lanolin is not harmful to your baby. ? Rub some breast  milk into your nipples:  Use your hand to squeeze out a few drops of breast milk.  Gently massage the milk into your nipples.  Let your nipples air-dry.  Wear a supportive nursing bra. Avoid wearing: ? Tight clothing. ? Underwire bras or bras that put pressure on your breasts.  Use ice to help relieve pain or swelling of your breasts: ? Put ice in a plastic bag. ? Place a towel between your skin and the bag. ? Leave the ice on for 20 minutes, 2-3 times a day. General instructions  Drink enough fluid to keep your pee (urine) pale yellow.  Get plenty of rest. Sleep when your baby sleeps.  Talk to your doctor or breastfeeding specialist before taking any herbal supplements. Contact a health care provider if:  You have nipple pain.  You have cracking or soreness in your nipples that lasts longer than 1 week.  Your breasts are overfilled with milk (engorgement) and this lasts longer than 48 hours.  You have a fever.  You have pus-like fluid coming from your nipple.  You have redness, a rash, swelling, itching, or burning on your breast.  Your baby does not gain weight.  Your baby loses weight. Summary  There are things that you can do to take care of yourself and help prevent many common breastfeeding problems.  Always make sure that your baby's mouth attaches (latches) to your nipple properly to breastfeed.  Keep your nipples   from getting too dry, drink plenty of fluid, and get plenty of rest.  Feed on demand. Do not delay feedings. This information is not intended to replace advice given to you by your health care provider. Make sure you discuss any questions you have with your health care provider. Document Revised: 01/06/2019 Document Reviewed: 04/23/2017 Elsevier Patient Education  2020 [employer] Postpartum Care After Vaginal Delivery This sheet gives you information about how to care for yourself from the time you deliver your baby to up to 6-12 weeks after  delivery (postpartum period). Your health care provider may also give you more specific instructions. If you have problems or questions, contact your health care provider. Follow these instructions at home: Vaginal bleeding  It is normal to have vaginal bleeding (lochia) after delivery. Wear a sanitary pad for vaginal bleeding and discharge. ? During the first week after delivery, the amount and appearance of lochia is often similar to a menstrual period. ? Over the next few weeks, it will gradually decrease to a dry, yellow-brown discharge. ? For most women, lochia stops completely by 4-6 weeks after delivery. Vaginal bleeding can vary from woman to woman.  Change your sanitary pads frequently. Watch for any changes in your flow, such as: ? A sudden increase in volume. ? A change in color. ? Large blood clots.  If you pass a blood clot from your vagina, save it and call your health care provider to discuss. Do not flush blood clots down the toilet before talking with your health care provider.  Do not use tampons or douches until your health care provider says this is safe.  If you are not breastfeeding, your period should return 6-8 weeks after delivery. If you are feeding your child breast milk only (exclusive breastfeeding), your period may not return until you stop breastfeeding. Perineal care  Keep the area between the vagina and the anus (perineum) clean and dry as told by your health care provider. Use medicated pads and pain-relieving sprays and creams as directed.  If you had a cut in the perineum (episiotomy) or a tear in the vagina, check the area for signs of infection until you are healed. Check for: ? More redness, swelling, or pain. ? Fluid or blood coming from the cut or tear. ? Warmth. ? Pus or a bad smell.  You may be given a squirt bottle to use instead of wiping to clean the perineum area after you go to the bathroom. As you start healing, you may use the squirt  bottle before wiping yourself. Make sure to wipe gently.  To relieve pain caused by an episiotomy, a tear in the vagina, or swollen veins in the anus (hemorrhoids), try taking a warm sitz bath 2-3 times a day. A sitz bath is a warm water bath that is taken while you are sitting down. The water should only come up to your hips and should cover your buttocks. Breast care  Within the first few days after delivery, your breasts may feel heavy, full, and uncomfortable (breast engorgement). Milk may also leak from your breasts. Your health care provider can suggest ways to help relieve the discomfort. Breast engorgement should go away within a few days.  If you are breastfeeding: ? Wear a bra that supports your breasts and fits you well. ? Keep your nipples clean and dry. Apply creams and ointments as told by your health care provider. ? You may need to use breast pads to absorb milk that leaks  from your breasts. ? You may have uterine contractions every time you breastfeed for up to several weeks after delivery. Uterine contractions help your uterus return to its normal size. ? If you have any problems with breastfeeding, work with your health care provider or Advertising copywriter.  If you are not breastfeeding: ? Avoid touching your breasts a lot. Doing this can make your breasts produce more milk. ? Wear a good-fitting bra and use cold packs to help with swelling. ? Do not squeeze out (express) milk. This causes you to make more milk. Intimacy and sexuality  Ask your health care provider when you can engage in sexual activity. This may depend on: ? Your risk of infection. ? How fast you are healing. ? Your comfort and desire to engage in sexual activity.  You are able to get pregnant after delivery, even if you have not had your period. If desired, talk with your health care provider about methods of birth control (contraception). Medicines  Take over-the-counter and prescription medicines  only as told by your health care provider.  If you were prescribed an antibiotic medicine, take it as told by your health care provider. Do not stop taking the antibiotic even if you start to feel better. Activity  Gradually return to your normal activities as told by your health care provider. Ask your health care provider what activities are safe for you.  Rest as much as possible. Try to rest or take a nap while your baby is sleeping. Eating and drinking   Drink enough fluid to keep your urine pale yellow.  Eat high-fiber foods every day. These may help prevent or relieve constipation. High-fiber foods include: ? Whole grain cereals and breads. ? Brown rice. ? Beans. ? Fresh fruits and vegetables.  Do not try to lose weight quickly by cutting back on calories.  Take your prenatal vitamins until your postpartum checkup or until your health care provider tells you it is okay to stop. Lifestyle  Do not use any products that contain nicotine or tobacco, such as cigarettes and e-cigarettes. If you need help quitting, ask your health care provider.  Do not drink alcohol, especially if you are breastfeeding. General instructions  Keep all follow-up visits for you and your baby as told by your health care provider. Most women visit their health care provider for a postpartum checkup within the first 3-6 weeks after delivery. Contact a health care provider if:  You feel unable to cope with the changes that your child brings to your life, and these feelings do not go away.  You feel unusually sad or worried.  Your breasts become red, painful, or hard.  You have a fever.  You have trouble holding urine or keeping urine from leaking.  You have little or no interest in activities you used to enjoy.  You have not breastfed at all and you have not had a menstrual period for 12 weeks after delivery.  You have stopped breastfeeding and you have not had a menstrual period for 12 weeks  after you stopped breastfeeding.  You have questions about caring for yourself or your baby.  You pass a blood clot from your vagina. Get help right away if:  You have chest pain.  You have difficulty breathing.  You have sudden, severe leg pain.  You have severe pain or cramping in your lower abdomen.  You bleed from your vagina so much that you fill more than one sanitary pad in one hour. Bleeding  should not be heavier than your heaviest period.  You develop a severe headache.  You faint.  You have blurred vision or spots in your vision.  You have bad-smelling vaginal discharge.  You have thoughts about hurting yourself or your baby. If you ever feel like you may hurt yourself or others, or have thoughts about taking your own life, get help right away. You can go to the nearest emergency department or call:  Your local emergency services (911 in the [place]  A suicide crisis helpline, such as the National Suicide Prevention Lifeline at 4403079122. This is open 24 hours a day. Summary  The period of time right after you deliver your newborn up to 6-12 weeks after delivery is called the postpartum period.  Gradually return to your normal activities as told by your health care provider.  Keep all follow-up visits for you and your baby as told by your health care provider. This information is not intended to replace advice given to you by your health care provider. Make sure you discuss any questions you have with your health care provider. Document Revised: 09/19/2017 Document Reviewed: 06/30/2017 Elsevier Patient Education  2020 [employer].

## 2020-09-20 NOTE — Anesthesia Postprocedure Evaluation (Signed)
Anesthesia Post Note  Patient: Brooke Sherman  Procedure(s) Performed: AN AD HOC LABOR EPIDURAL     Patient location during evaluation: Mother Baby Anesthesia Type: Epidural Level of consciousness: awake and alert Pain management: pain level controlled Vital Signs Assessment: post-procedure vital signs reviewed and stable Respiratory status: spontaneous breathing Cardiovascular status: stable Postop Assessment: no headache, patient able to bend at knees, no backache, no apparent nausea or vomiting, epidural receding, adequate PO intake and able to ambulate Anesthetic complications: no   No complications documented.  Last Vitals:  Vitals:   09/20/20 0248 09/20/20 0623  BP: 123/75 123/72  Pulse: (!) 57   Resp: 18 16  Temp: 36.8 C 36.8 C  SpO2:  100%    Last Pain:  Vitals:   09/20/20 0823  TempSrc:   PainSc: 5    Pain Goal:                   Salome Arnt

## 2020-09-29 ENCOUNTER — Other Ambulatory Visit: Payer: Self-pay

## 2020-09-29 ENCOUNTER — Inpatient Hospital Stay (HOSPITAL_COMMUNITY)
Admission: AD | Admit: 2020-09-29 | Discharge: 2020-09-29 | Disposition: A | Discharge status: Home / Self Care | Payer: 59 | Attending: Family Medicine | Admitting: Family Medicine

## 2020-09-29 DIAGNOSIS — N760 Acute vaginitis: Secondary | ICD-10-CM | POA: Insufficient documentation

## 2020-09-29 DIAGNOSIS — O8613 Vaginitis following delivery: Secondary | ICD-10-CM

## 2020-09-29 DIAGNOSIS — Z79899 Other long term (current) drug therapy: Secondary | ICD-10-CM | POA: Insufficient documentation

## 2020-09-29 DIAGNOSIS — R102 Pelvic and perineal pain: Secondary | ICD-10-CM | POA: Insufficient documentation

## 2020-09-29 MED ORDER — KETOROLAC TROMETHAMINE 60 MG/2ML IM SOLN
60.0000 mg | Freq: Once | INTRAMUSCULAR | Status: AC
  Administered 2020-09-29: 60 mg via INTRAMUSCULAR
  Filled 2020-09-29: qty 2

## 2020-09-29 MED ORDER — OXYCODONE-ACETAMINOPHEN 5-325 MG PO TABS: 1.0000 | ORAL_TABLET | ORAL | 0 refills | Status: AC | PRN

## 2020-09-29 MED ORDER — AMOXICILLIN-POT CLAVULANATE 500-125 MG PO TABS: 1.0000 | ORAL_TABLET | Freq: Two times a day (BID) | ORAL | 0 refills | Status: AC

## 2020-09-29 NOTE — Discharge Instructions (Signed)
Can take ibuprofen again 6hrs after your toradol was given, but may begin percocet as soon as you get home.

## 2020-09-29 NOTE — Progress Notes (Signed)
Digital exam performed by Tyler Aas, CNM to eval pt c/o vaginal pain.

## 2020-09-29 NOTE — Progress Notes (Signed)
Digital exam performed by Gildardo Griffes, MD to further evaluate pt's c/o pain.

## 2020-09-29 NOTE — MAU Note (Signed)
Delivered SVD w/ second degree lac on 12/21.  Patient reports the pain has not improved since then.  Still having to take ibuprofen around the clock and feels like it shouldn't still be this painful.  Denies any fevers.

## 2020-09-29 NOTE — MAU Provider Note (Signed)
Event Date/Time   First Provider Initiated Contact with Patient 09/29/20 1244     S Ms. Brooke Sherman is a 34 y.o. G55P1001 female at 10 days postpartum presenting to MAU with vaginal pain and tenderness. She had a vaginal delivery with second degree and bilateral sulcal lacerations. She is having to take ibuprofen and tylenol around the clock and still having a lot of breakthrough pain. The pain got better around day 4-5 postpartum and then got significantly worse. Her lochia is now almost completely gone, and she denies urinary symptoms or problems having bowel movements.  O BP 119/77 (BP Location: Right Arm)   Pulse 70   Temp 98.2 F (36.8 C) (Oral)   Resp 17   Wt 166 lb 8 oz (75.5 kg)   SpO2 97%   BMI 27.71 kg/m  Physical Exam Vitals and nursing note reviewed. Exam conducted with a chaperone present.  Constitutional:      General: She is in acute distress (during vaginal exam).     Appearance: Normal appearance. She is normal weight. She is not ill-appearing.  HENT:     Mouth/Throat:     Mouth: Mucous membranes are dry.  Eyes:     Pupils: Pupils are equal, round, and reactive to light.  Cardiovascular:     Rate and Rhythm: Normal rate and regular rhythm.     Pulses: Normal pulses.  Pulmonary:     Effort: Pulmonary effort is normal.  Abdominal:     Palpations: Abdomen is soft.  Genitourinary:    Vagina: Erythema, tenderness and bleeding present.     Musculoskeletal:        General: Normal range of motion.  Skin:    General: Skin is warm and dry.     Capillary Refill: Capillary refill takes less than 2 seconds.  Neurological:     Mental Status: She is alert and oriented to person, place, and time.  Psychiatric:        Mood and Affect: Mood normal.        Behavior: Behavior normal.        Thought Content: Thought content normal.        Judgment: Judgment normal.    Evaluated the vagina and gave pt a dose of toradol IM prior to second exam by Dr.  Shawnie Pons. Dr. Shawnie Pons examined the pt and recommended a course of antibiotics (augmentin).  A Vaginal infection - augmentin and percocet sent to pharmacy - Encouraged pt to let us know if pain does not resolve within 48-72hrs after beginning antibiotic therapy  P Discharge from MAU in stable condition Postpartum follow up scheduled for 10/24/19 at [facility] Warning signs for worsening condition that would warrant emergency follow-up discussed  Bernerd Limbo, CNM 09/29/2020 2:24 PM

## 2020-10-02 ENCOUNTER — Telehealth: Payer: Self-pay

## 2020-10-02 NOTE — Telephone Encounter (Signed)
Patient left a message she is still in pain. She was put on antibiotic but she is still pain. I attempted to call patient but their was no answer or voicemail to leave a message.

## 2020-10-03 ENCOUNTER — Other Ambulatory Visit: Payer: Self-pay

## 2020-10-03 ENCOUNTER — Ambulatory Visit (INDEPENDENT_AMBULATORY_CARE_PROVIDER_SITE_OTHER): Payer: 59 | Admitting: Obstetrics & Gynecology

## 2020-10-03 ENCOUNTER — Encounter: Payer: Self-pay | Admitting: Obstetrics & Gynecology

## 2020-10-03 DIAGNOSIS — N760 Acute vaginitis: Secondary | ICD-10-CM

## 2020-10-03 DIAGNOSIS — O99893 Other specified diseases and conditions complicating puerperium: Secondary | ICD-10-CM

## 2020-10-03 MED ORDER — LIDOCAINE HCL URETHRAL/MUCOSAL 2 % EX GEL
1.0000 | Freq: Three times a day (TID) | CUTANEOUS | 0 refills | Status: DC
Start: 2020-10-03 — End: 2020-11-28

## 2020-10-03 NOTE — Progress Notes (Signed)
GYNECOLOGY  VISIT  CC:   Vaginal pain  HPI: 35 y.o. G5P1001 Married White or Caucasian female here for complaint of vaginal pain that started after she delivered on 09/19/2020.  She reports she was taking 861m ibuprofen every 8 hours.  Pain is worse with sitting and with walking.  Lochia is improved.    Was seen in [facility]U on 09/29/2020  Denies fever.    Patient Active Problem List   Diagnosis Date Noted  . Postpartum care following vaginal delivery 09/20/2020  . Post term pregnancy over 40 weeks 09/18/2020  . Hypothyroidism affecting pregnancy 05/14/2020  . Encounter for supervision of normal first pregnancy 03/27/2020    Past Medical History:  Diagnosis Date  . Hypothyroid     Past Surgical History:  Procedure Laterality Date  . ANTERIOR CRUCIATE LIGAMENT REPAIR Left 2019  . WISDOM TOOTH EXTRACTION      MEDS:   Current Outpatient Medications on File Prior to Visit  Medication Sig Dispense Refill  . amoxicillin-clavulanate (AUGMENTIN) 500-125 MG tablet Take 1 tablet (500 mg total) by mouth 2 (two) times daily for 7 days. Take with food. 14 tablet 0  . ibuprofen (ADVIL) 600 MG tablet Take 1 tablet (600 mg total) by mouth every 6 (six) hours. 30 tablet 0  . levothyroxine (SYNTHROID) 75 MCG tablet Take 1 tablet (75 mcg total) by mouth daily before breakfast. 30 tablet 3  . Blood Pressure Monitor KIT 1 Device by Does not apply route once a week. To be monitored Regularly at home. (Patient not taking: Reported on 10/03/2020) 1 kit 0  . Misc. Devices (BREAST PUMP) MISC 1 Device by Does not apply route daily. (Patient not taking: No sig reported) 1 each 0  . Misc. Devices (GOJJI WEIGHT SCALE) MISC Use as instructed (Patient not taking: Reported on 10/03/2020) 1 each 0  . norethindrone (ORTHO MICRONOR) 0.35 MG tablet Take 1 tablet (0.35 mg total) by mouth daily. (Patient not taking: Reported on 10/03/2020) 28 tablet 11  . Prenatal Vit-Fe Fumarate-FA (PRENATAL VITAMINS PO) Take by mouth.  (Patient not taking: Reported on 10/03/2020)     No current facility-administered medications on file prior to visit.    ALLERGIES: Patient has no known allergies.  No family history on file.  SH:  Married, non smoker  Review of Systems  All other systems reviewed and are negative.   PHYSICAL EXAMINATION:    BP 118/75   Pulse 64   Wt 166 lb (75.3 kg)   BMI 27.62 kg/m     General appearance: alert, cooperative and appears stated age Lymph:  no inguinal LAD noted  Pelvic: External genitalia:  no lesions, sutures intact, no erythema, vicryl suture at introitus causes pain if palpated but no drainage or evidence of infection present              Urethra:  normal appearing urethra with no masses, tenderness or lesions              Bartholins and Skenes: normal                 Vagina: normal appearing vagina with normal color and discharge, no lesions, side wall sutures in tact without drainage, edema, or erythema  Chaperone was present for exam.  Assessment/Plan: 1. Encounter for postpartum care after hospital delivery -vaginal and vulvar tissue appears like it is healing well.  Recommend finishing antibiotics.  No evidence of active infection today. - topical lidocaine at suture knot at introitus recommended.  Lidocaine (XYLOCAINE) 2 % jelly; Apply 1 application topically 3 (three) times daily.  Dispense: 30 mL; Refill: 0  2. Vaginal infection - complete antibiotics given in MAU - return for routine post partum appt scheduled 10/23/2020

## 2020-10-03 NOTE — Progress Notes (Signed)
Pt had vaginal delivery on 09-19-20 Pt reports that she has been taking Augmentin that was prescribed for vaginal infection after being seen in MAU on 09-29-20 with pain at 8/10. Pt states that today she is still having pain 6/10 with sitting, standing, and overall position changes.  Edinburgh=0

## 2020-10-17 ENCOUNTER — Ambulatory Visit: Payer: 59 | Admitting: Nurse Practitioner

## 2020-10-18 ENCOUNTER — Ambulatory Visit (INDEPENDENT_AMBULATORY_CARE_PROVIDER_SITE_OTHER): Payer: 59 | Admitting: Obstetrics and Gynecology

## 2020-10-18 ENCOUNTER — Other Ambulatory Visit: Payer: Self-pay

## 2020-10-18 DIAGNOSIS — O9089 Other complications of the puerperium, not elsewhere classified: Secondary | ICD-10-CM | POA: Insufficient documentation

## 2020-10-18 DIAGNOSIS — R102 Pelvic and perineal pain: Secondary | ICD-10-CM | POA: Diagnosis not present

## 2020-10-18 NOTE — Progress Notes (Signed)
Pt with SVD on 09/19/20. Pt c/o constant discomfort where sutures are. States she has been using numbing cream without relief. Pt has not started OCP yet.

## 2020-10-18 NOTE — Patient Instructions (Signed)
Obstetrics and Gynecology, 128(1), e1-e15. [id number]">  Care of a Perineal Tear The following information offers guidance about how to care for a perineal tear (laceration). Some women develop a perineal tear during a vaginal birth. This can happen as the baby emerges from the birth canal and the area between the vagina and the anus (perineum) is stretched, or a surgical cut is made (episiotomy). There are four degrees of perineal tears based on the depth and length of the laceration.  First degree. This involves a shallow tear at the edge of the vaginal opening that extends slightly into the perineal skin.  Second degree. This involves tearing described in first-degree perineal tear, and an additional deeper tear of the vaginal opening and perineal tissues. It may also include tearing of a muscle just under the perineal skin.  Third degree. This involves tearing described in first-degree and second-degree perineal tears. Tearing in the third degree extends into the muscle of the anus (anal sphincter).  Fourth degree. This involves tears described in all three degrees. Tearing in the fourth degree extends into the rectum. First-degree and second-degree perineal tears may or may not be stitched closed, depending on their location and appearance. Third-degree and fourth-degree perineal tears are stitched closed immediately after the baby's birth. What are the risks? Depending on the type of perineal tear you have, you may be at risk for:  Bleeding.  Developing a collection of blood in the perineal tear area (hematoma).  Pain when you urinate or have a bowel movement.  Infection at the site of the tear.  Fever.  Trouble controlling urination or bowel movements (incontinence).  Painful sex. How to care for a perineal tear Wound care  Take sitz baths as told by your health care provider to speed up healing. In a sitz bath, you sit down in warm water. A  sitz bath can be taken in one of two ways: ? Using an over-the-toilet sitz bath. ? Using a bathtub. The water should be deep enough to cover your hips and buttocks.  Wear a sanitary pad as told by your health care provider. Change the pad as often as told.  Leave stitches (sutures), skin glue, or adhesive tape in place. Do not remove adhesive tape unless your health care provider tells you to do so.  Check your wound every day for signs of infection. Check for: ? Redness, swelling, or pain. ? Fluid or blood. ? Warmth. ? Pus or a bad smell.      Managing pain  If directed, put ice on the affected area. To do this: ? Put ice in a plastic bag. ? Place a towel between your skin and the bag. ? Leave the ice on for 20 minutes, 2-3 times a day. ? Remove the ice if your skin turns bright red. This is very important. If you cannot feel pain, heat, or cold, you have a greater risk of damage to the area.  Apply a numbing spray to the perineal tear site as told by your health care provider. This may help with discomfort.  Take or apply over-the-counter and prescription medicines only as told by your health care provider.  If told, put about three witch hazel-containing hemorrhoid treatment pads on top of your sanitary pad. The witch hazel in the hemorrhoid pads helps with swelling and discomfort.  If comfortable, sit on an inflatable ring or pillow.   General instructions  Use a squirt bottle to squeeze warm water on your perineum after urinating to  clean the area. This is instead of wiping and should be done from front to back. Pat the area gently to dry it.  Do not have sex, use tampons, or place anything in your vagina for at least 6 weeks or as told by your health care provider.  Keep all follow-up visits, including postpartum visits. This is important. Contact a health care provider if:  Your pain is not relieved with medicines.  You have painful urination.  You have redness,  swelling, or pain around your tear.  You have fluid or blood coming from your tear.  Your tear feels warm to the touch.  You have pus or a bad smell coming from your tear.  You have a fever. Get help right away if:  Your tear opens.  You cannot urinate.  You have an increase in bleeding.  You have severe pain. Summary  A perineal tear is a tear (laceration) in the tissue between the opening of the vagina and the anus (perineum).  There are four degrees of perineal tears based on how deep and long the laceration is.  First-degree and second-degree perineal tears may or may not be stitched closed, depending on their location and appearance. Third-degree and fourth-degree perineal tears are stitched closed immediately after the baby's birth.  Get help right away if your tear opens, you cannot urinate, you have an increase in bleeding, or you have severe pain. This information is not intended to replace advice given to you by your health care provider. Make sure you discuss any questions you have with your health care provider. Document Revised: 06/01/2020 Document Reviewed: 06/01/2020 Elsevier Patient Education  2021 Elsevier Inc.  

## 2020-10-18 NOTE — Progress Notes (Signed)
   Subjective:  CC: postpartum perineal pain  Patient ID: Brooke Sherman, female    DOB: 09-14-1986, 35 y.o.   MRN: [id number]  HPI PT seen for follow up of vaginal repair pain.  She had SVD on 09/19/20 with second degree and sucal tears.  The tears were appropriately addressed.  She did have some local discomfort at these sites which remains consistent.  She was seen in the MAU on 09/29/20.  The patient was started on augmentin at that time.  She was then seen by another faculty physician on 10/03/20.  The vagina was described as healing well with no sign of infection.  Pt presents today with continued discomfort.  She had a list of questions which was addressed point by point.  She has finished her antibiotics, but notes the lidocaine jelly has not been very helpful.     Review of Systems  Constitutional: Negative.   HENT: Negative.   Respiratory: Negative.   Cardiovascular: Negative.   Gastrointestinal: Negative.   Genitourinary: Positive for vaginal discharge and vaginal pain.       Vaginal spotting  Musculoskeletal: Negative.   Neurological: Negative.        Objective:   Physical Exam Genitourinary:    Comments: Perineum was intact.  No erythema or induration noted. 3-4 sutures seen in the vagina.  Sidewall repair noted with vicryl suture at midline.  Vaginal mucosa looked healthy and healing well.  Midline and sidewall sutures removed in sterile fashion.  Some suture still present in location of sucal tear repair.  No pus or drainage noted.  No mass felt underneath second degree repair   Vitals:   10/18/20 0900  BP: 123/71  Pulse: 78         Assessment & Plan:   1. Postpartum perineal pain Vaginal laceration repair is healing well.  Some superficial sutures were removed to see if this made the patient feel more comfortable. She states it feels about the same.  Pt has virtual postpartum on Monday. Pt advised that if the discomfort remains over the next few  weeks, she may need evaluation with the urogynecologist for further assessment.  Pelvic rest another 3-4 weeks unless discomfort regresses.    Warden Fillers, MD Faculty Attending, Center for [facility]

## 2020-10-19 ENCOUNTER — Ambulatory Visit: Payer: 59

## 2020-10-20 ENCOUNTER — Other Ambulatory Visit (INDEPENDENT_AMBULATORY_CARE_PROVIDER_SITE_OTHER): Payer: 59 | Admitting: Obstetrics and Gynecology

## 2020-10-20 DIAGNOSIS — O9089 Other complications of the puerperium, not elsewhere classified: Secondary | ICD-10-CM

## 2020-10-20 DIAGNOSIS — R102 Pelvic and perineal pain: Secondary | ICD-10-CM

## 2020-10-20 NOTE — Progress Notes (Signed)
Referral to urogynecology

## 2020-10-23 ENCOUNTER — Telehealth (INDEPENDENT_AMBULATORY_CARE_PROVIDER_SITE_OTHER): Payer: 59 | Admitting: Advanced Practice Midwife

## 2020-10-23 DIAGNOSIS — O9089 Other complications of the puerperium, not elsewhere classified: Secondary | ICD-10-CM

## 2020-10-23 DIAGNOSIS — Z3009 Encounter for other general counseling and advice on contraception: Secondary | ICD-10-CM

## 2020-10-23 DIAGNOSIS — O99285 Endocrine, nutritional and metabolic diseases complicating the puerperium: Secondary | ICD-10-CM

## 2020-10-23 DIAGNOSIS — E039 Hypothyroidism, unspecified: Secondary | ICD-10-CM

## 2020-10-23 DIAGNOSIS — R102 Pelvic and perineal pain: Secondary | ICD-10-CM

## 2020-10-23 NOTE — Progress Notes (Signed)
TELEHEALTH POSTPARTUM VISIT ENCOUNTER NOTE  Provider location: [facility] for [facility] at [place]   I connected with@ on 10/23/20 at 11:15 AM EST by telephone at home and verified that I am speaking with the correct person using two identifiers.   I discussed the limitations, risks, security and privacy concerns of performing an evaluation and management service by telephone and the availability of in person appointments. I also discussed with the patient that there may be a patient responsible charge related to this service. The patient expressed understanding and agreed to proceed.  Appointment Date: 10/23/2020  OBGYN Clinic: [facility]  Chief Complaint:  Postpartum Visit  History of Present Illness: Brooke Sherman is a 35 y.o. Caucasian G1P1001 (No LMP recorded.), seen for the above chief complaint. Her past medical history is significant for Hypothyroid   She is s/p normal spontaneous vaginal delivery on 09/19/20 at 40 weeks; she was discharged to home on 09/20/21. Pregnancy complicated by hypothyroid on medicaitons. Baby is doing well.  Complains of vaginal pain and swelling.   Vaginal bleeding or discharge: Yes  Mode of feeding infant: Breast Intercourse: No  Contraception: None at this time considering IUD wants to discuss.  PP depression s/s: No .  Any bowel or bladder issues: No  Pap smear: Done at OB/GYN in [place].    Review of Systems: Positive for vaginal/perineal pain only. Her 12 point review of systems is negative or as noted in the History of Present Illness.  Patient Active Problem List   Diagnosis Date Noted  . Postpartum perineal pain 10/18/2020  . Postpartum care following vaginal delivery 09/20/2020  . Post term pregnancy over 40 weeks 09/18/2020  . Hypothyroidism affecting pregnancy 05/14/2020  . Encounter for supervision of normal first pregnancy 03/27/2020    Medications Juliann Mule had no medications administered during this  visit. Current Outpatient Medications  Medication Sig Dispense Refill  . levothyroxine (SYNTHROID) 75 MCG tablet Take 1 tablet (75 mcg total) by mouth daily before breakfast. 30 tablet 3  . Prenatal Vit-Fe Fumarate-FA (PRENATAL VITAMINS PO) Take by mouth.    . Blood Pressure Monitor KIT 1 Device by Does not apply route once a week. To be monitored Regularly at home. (Patient not taking: No sig reported) 1 kit 0  . ibuprofen (ADVIL) 600 MG tablet Take 1 tablet (600 mg total) by mouth every 6 (six) hours. (Patient not taking: Reported on 10/23/2020) 30 tablet 0  . lidocaine (XYLOCAINE) 2 % jelly Apply 1 application topically 3 (three) times daily. (Patient not taking: Reported on 10/23/2020) 30 mL 0  . Misc. Devices (BREAST PUMP) MISC 1 Device by Does not apply route daily. (Patient not taking: Reported on 10/23/2020) 1 each 0  . Misc. Devices (GOJJI WEIGHT SCALE) MISC Use as instructed (Patient not taking: No sig reported) 1 each 0  . norethindrone (ORTHO MICRONOR) 0.35 MG tablet Take 1 tablet (0.35 mg total) by mouth daily. (Patient not taking: No sig reported) 28 tablet 11   No current facility-administered medications for this visit.    Allergies Patient has no known allergies.  Physical Exam:  General:  Alert, oriented and cooperative.   Mental Status: Normal mood and affect perceived. Normal judgment and thought content.  Rest of physical exam deferred due to type of encounter  PP Depression Screening:    Edinburgh Postnatal Depression Scale - 10/23/20 1132      Edinburgh Postnatal Depression Scale:  In the Past 7 Days   I have  been able to laugh and see the funny side of things. 0    I have looked forward with enjoyment to things. 0    I have blamed myself unnecessarily when things went wrong. 0    I have been anxious or worried for no good reason. 0    I have felt scared or panicky for no good reason. 0    Things have been getting on top of me. 0    I have been so unhappy that I  have had difficulty sleeping. 0    I have felt sad or miserable. 0    I have been so unhappy that I have been crying. 0    The thought of harming myself has occurred to me. 0    Edinburgh Postnatal Depression Scale Total 0           Assessment:Patient is a 35 y.o. G1P1001 who is 4 weeks postpartum from a normal spontaneous vaginal delivery.  She is doing well but still having some perineal pain.   Plan: 1. Postpartum perineal pain --Still having perineal pain, has seen some sutures when wiping in last 2 days. --Was seen in office x 2 for this pain, some sutures removed on 1/21 by Dr Elgie Congo.  Healing going well per evaluation by Drs Sabra Heck and Elgie Congo. --Pain is only mildly improved, pt reports still painful to sit or walk for very long. --Recommend sitz bath with Epsom salt BID. Sit in bath while breastfeeding baby if needed so can multitask. --Reassurance provided that this is likely normal healing, reaction to sutures that will improve when completely resolved. Vicryl used so should be dissolved by 6 weeks PP. --F/U in 1 month in office with me  2. Postpartum care following vaginal delivery --Doing well, bonding well with infant, good support at home --[facility] to resume exercise, intercourse if desired but wants to wait at this time due to pain.   --Contraceptive counseling, pt desires LARC, Nexplanon or IUD. --Will place in office at 4 week f/u visit --No constipation/diarrhea, no urinary retention or incontinence, no pain other than perineal pain --Reports some mild baby blues x 2 weeks that are gone, feeling more like herself now.   I discussed the assessment and treatment plan with the patient. The patient was provided an opportunity to ask questions and all were answered. The patient agreed with the plan and demonstrated an understanding of the instructions.   The patient was advised to call back or seek an in-person evaluation/go to the ED for any concerning postpartum symptoms.  I  provided 10 minutes of non-face-to-face time during this encounter.   Fatima Blank, [place] for [employer], [place]

## 2020-11-06 NOTE — Progress Notes (Signed)
[facility] New Patient Evaluation and Consultation  Referring Provider: Warden Fillers, MD PCP: Patient, No Pcp Per Date of Service: 11/08/2020  SUBJECTIVE Chief Complaint: New Patient (Initial Visit) (Dr Brooke Sherman referral; vaginal pain from lacerations during birth (2nd degree and bilateral succal))  History of Present Illness: Brooke Sherman is a 35 y.o. White or Caucasian female seen in consultation at the request of Dr. Donavan Sherman for evaluation of postpartum perineal pain.    Review of records from Dr Brooke Sherman significant for: S/p SVD on 09/19/20. Had second degree and sulcal tears at delivery (Fetal weight 3555g). Had taken antibiotics but did not have relief of her symptoms. Perineum is still painful and is unable to sit or walk for long periods.   Urinary Symptoms: Does not leak urine.   Day time voids 10-12.  Nocturia: 1 times per night to void. Voiding dysfunction: she empties her bladder well.  does not use a catheter to empty bladder.  When urinating, she feels she has no difficulties  UTIs: 0 UTI's in the last year.   Denies history of blood in urine and kidney or bladder stones  Pelvic Organ Prolapse Symptoms:                  She Denies a feeling of a bulge the vaginal area.  Bowel Symptom: Bowel movements: 1-2 time(s) per day Stool consistency: soft  Straining: no.  Splinting: no.  Incomplete evacuation: no.  She Denies accidental bowel leakage / fecal incontinence Bowel regimen: none   Sexual Function Sexually active: not currently  Sexual orientation: heterosexual  Pelvic Pain Denies regular pelvic pain  Reports that pain at the introitus has improved a bit over the last two weeks, but still feels like it is "pulling", usually when standing up from off the toilet. Sitting and standing for any period of time caused a dull, deep ache.   In the past, has had a reaction to suture with an ACL repair.   Past Medical History:  Past Medical  History:  Diagnosis Date  . Hypothyroid      Past Surgical History:   Past Surgical History:  Procedure Laterality Date  . ANTERIOR CRUCIATE LIGAMENT REPAIR Left 2019  . WISDOM TOOTH EXTRACTION       Past OB/GYN History: G1 P1 Vaginal deliveries: 1,  Forceps/ Vacuum deliveries: 0, Cesarean section: 0 Menopausal: No Contraception: none currently Last pap smear was 10/2019- negative.  Any history of abnormal pap smears: no.   Medications: She has a current medication list which includes the following prescription(s): levothyroxine, prenatal vit-fe fumarate-fa, and lidocaine.   Allergies: Patient has No Known Allergies.   Social History:  Social History   Tobacco Use  . Smoking status: Never Smoker  . Smokeless tobacco: Never Used  Vaping Use  . Vaping Use: Never used  Substance Use Topics  . Alcohol use: Yes    Comment: 1-2 times week  . Drug use: Not Currently    Relationship status: married She lives with husband and child.   She is employed- self-employed. Regular exercise: Yes: cardio and weight training History of abuse: No  Family History:   Family History  Problem Relation Age of Onset  . Cervical cancer Mother      Review of Systems: Review of Systems  Constitutional: Negative for fever, malaise/fatigue and weight loss.  Respiratory: Negative for cough, shortness of breath and wheezing.   Cardiovascular: Negative for chest pain, palpitations and leg swelling.  Gastrointestinal: Negative  for abdominal pain and blood in stool.  Genitourinary: Negative for dysuria.  Musculoskeletal: Negative for myalgias.  Skin: Negative for rash.  Neurological: Negative for dizziness and headaches.  Endo/Heme/Allergies: Does not bruise/bleed easily.  Psychiatric/Behavioral: Negative for depression. The patient is not nervous/anxious.      OBJECTIVE Physical Exam: Vitals:   11/08/20 0938  BP: 121/73  Pulse: 67  Weight: 161 lb 8 oz (73.3 kg)  Height: 5\' 5"   (1.651 m)    Physical Exam Constitutional:      General: She is not in acute distress. Pulmonary:     Effort: Pulmonary effort is normal.  Abdominal:     General: There is no distension.  Musculoskeletal:        General: No swelling. Normal range of motion.  Skin:    General: Skin is warm and dry.     Findings: No rash.  Neurological:     Mental Status: She is alert and oriented to person, place, and time.  Psychiatric:        Mood and Affect: Mood normal.        Behavior: Behavior normal.      GU / Detailed Urogynecologic Evaluation:  Pelvic Exam: Normal external female genitalia; Bartholin's and Skene's glands normal in appearance; urethral meatus normal in appearance, no urethral masses or discharge.   At introitus: granulation tissue present at 6 o'clock, extremly tender to palpation with q-tip  Speculum exam reveals normal vaginal mucosa, small band of tissue present with granulation tissue at 7 o'clock approximately 2cm from introitus. Cervix normal appearance. Uterus normal single, nontender. Adnexa not evaluated.    Areas of granulation tissue cauterized with silver nitrate. Lidocaine jelly 2% was placed in this area after.   POP-Q:  Deferred   Rectal Exam:  Deferred due to patient discomfort.    Laboratory Results: No urine sample obtained  ASSESSMENT AND PLAN [name] is a 35 y.o. with:  1. Postpartum perineal pain   2. Granulation tissue at vaginal vault    - Granulation tissue was cauterized today with silver nitrate.  - Also small bands of scar tissue present at introitus and 2cm into vagina. She will work on perineal massage in this area. She was advised to use lubricant and massage gently with her finger, and hold pressure on areas of discomfort. Also discussed potential referral to physical therapy but she would like to try massage on her own first.  - Lidocaine jelly also provided to help with discomfort and she can place as needed at introitus.   - Of note, rectal exam not performed today due to patient discomfort after placement of silver nitrate. However, will need to reassess at future visit to ensure perineal integrity.   Return 3 weeks.    20, MD   Medical Decision Making:  - Review and summation of prior records

## 2020-11-08 ENCOUNTER — Other Ambulatory Visit: Payer: Self-pay

## 2020-11-08 ENCOUNTER — Encounter: Payer: Self-pay | Admitting: Obstetrics and Gynecology

## 2020-11-08 ENCOUNTER — Ambulatory Visit (INDEPENDENT_AMBULATORY_CARE_PROVIDER_SITE_OTHER): Payer: 59 | Admitting: Obstetrics and Gynecology

## 2020-11-08 VITALS — BP 121/73 | HR 67 | Ht 65.0 in | Wt 161.5 lb

## 2020-11-08 DIAGNOSIS — R102 Pelvic and perineal pain: Secondary | ICD-10-CM | POA: Diagnosis not present

## 2020-11-08 DIAGNOSIS — O9089 Other complications of the puerperium, not elsewhere classified: Secondary | ICD-10-CM | POA: Diagnosis not present

## 2020-11-08 DIAGNOSIS — N898 Other specified noninflammatory disorders of vagina: Secondary | ICD-10-CM

## 2020-11-08 MED ORDER — LIDOCAINE HCL URETHRAL/MUCOSAL 2 % EX GEL
1.0000 "application " | Freq: Once | CUTANEOUS | Status: AC
  Administered 2020-11-08: 1 via TOPICAL

## 2020-11-08 MED ORDER — LIDOCAINE HCL URETHRAL/MUCOSAL 2 % EX GEL: 1.0000 "application " | Freq: Once | CUTANEOUS | Status: DC

## 2020-11-08 NOTE — Patient Instructions (Signed)
Today, we placed silver nitrate on the areas of granulation tissue. You may feel some burning and see some gray discharge.   You have also been provided with lidocaine jelly. You can place this on the affected area as needed. Use lubrication and start massaging the opening of the vagina with you finger or a dilator daily.

## 2020-11-20 ENCOUNTER — Other Ambulatory Visit: Payer: Self-pay

## 2020-11-20 ENCOUNTER — Ambulatory Visit (INDEPENDENT_AMBULATORY_CARE_PROVIDER_SITE_OTHER): Payer: 59 | Admitting: Advanced Practice Midwife

## 2020-11-20 VITALS — BP 113/69 | HR 63 | Wt 158.0 lb

## 2020-11-20 DIAGNOSIS — O9089 Other complications of the puerperium, not elsewhere classified: Secondary | ICD-10-CM

## 2020-11-20 DIAGNOSIS — Z3009 Encounter for other general counseling and advice on contraception: Secondary | ICD-10-CM

## 2020-11-20 DIAGNOSIS — R102 Pelvic and perineal pain: Secondary | ICD-10-CM

## 2020-11-20 NOTE — Patient Instructions (Signed)
How to do perineal massage: a step-by-step guide  So you know perineal massage might help you to avoid perineal tears and you're keen to try it out. Read on for our guide to how to massage your perineum. Research shows perineal massage in the third trimester might reduce your chances of perineal trauma if it's your first vaginal birth. What's more, most mums-to-be who tried perineal massage felt comfortable and positive about their experience. They said they'd do it again in another pregnancy and would recommend it to others. You can massage your perineum by yourself, or with your partner if you'd prefer. "Perineal massage aims to stretch, usually using two fingers, the perineal tissues ready for birth." You can start practising perineal massage from 34 weeks of pregnancy onwards or for the last four to six weeks of it. Here's what you'll need: Marland Kitchen Privacy and some uninterrupted time at home. . Clean hands . Short nails. Marland Kitchen Unscented oils - vitamin E oil, coconut oil, olive, sunflower or grape seed oil, unscented personal lubricant, or perineal massage oil (which can be expensive). Don't use synthetic oils such as baby oil or petroleum jelly (Vaseline). . Some pillows might be handy for your comfort - if you'd prefer to sit or lie back. . A mirror so you can see what you're doing if you're going to massage yourself. 1. Be prepared . Wash your hands thoroughly and make sure your or your partner's (if they're helping) fingernails are short. . Find somewhere you can relax, uninterrupted and in privacy with your legs open wide and your knees bent. . Make sure you have your chosen oil and your mirror handy if you need it. 2. Find a comfy position for you You could try: . being propped up with pillows on a bed or sofa so you or your partner can reach your perineum more easily . reclining in the bath with one leg up on the side at a time . standing in a warm shower with one leg up on a stool before you  change legs . sitting on the toilet. 3. Lubricate your perineum Put some oil on your perineum and the lower part of your vaginal opening. This helps to make the massage more comfortable. 4. Relax and start the massage Taking some deep breaths might help you relax. 1. Put your thumbs about 2.5cm to 4cm just inside the back wall of your vagina. You might find it easier to use a mirror the first few times. 2. Press down towards your anus and to the sides. You should feel a bit of a burning, stretching feeling. 3. Hold this stretch for about one to two minutes. 4. Then gently massage the lower bit of your vagina for a maximum of two to three minutes, focusing on relaxing your perineum. Massage using your thumbs upwards and outwards then back again in a U-shaped movement. You could practice your slow, deep breathing techniques while you do this.   Perineal massage shouldn't hurt, though you may feel pressure in the first few weeks of starting, which should ease. Avoid perineal massage if you have vaginal herpes, thrush or a vaginal infection. 5. Partner involvement . It can be difficult to massage your perineum by yourself in the later stages of pregnancy. . You might want to start off doing the perineal massage yourself. Then as you get nearer to the due date, if you feel comfortable doing so you could ask your partner to do it. . Simply follow the same method  as before. The only difference is that your partner should use their index fingers rather than thumbs to perform side-to-side and U-shaped downward pressure. 6. Keep up the routine . Try to do this one to two times per week. Doing it more often won't help more and might hurt the skin. . Fit it into your weekly routine, for example during or after a bath or shower. This is a good time because blood vessels in the area are already dilated, making the perineum softer and more comfortable to massage. You will also be more relaxed.

## 2020-11-20 NOTE — Progress Notes (Signed)
  GYNECOLOGY PROGRESS NOTE  History:  35 y.o. G1P1001 presents to [facility] office today for 8 week postpartum visit. She had virtual PP visit at 4 weeks on 10/23/20 and had significant perineal pain. She followed up with urogynocology on 11/08/20.  Pain is still present, with some firm scar tissue at perineum.  She is performing light perineal massage at home per urogyn recommendations and has not resumed intercourse for fear of pain.    She desires IUD for contraception.  She denies h/a, dizziness, shortness of breath, n/v, or fever/chills.    The following portions of the patient's history were reviewed and updated as appropriate: allergies, current medications, past family history, past medical history, past social history, past surgical history and problem list. Last pap smear on 10/26/19 was normal, neg HRHPV.  Review of Systems:  Pertinent items are noted in HPI.   Objective:  Physical Exam Blood pressure 113/69, pulse 63, weight 158 lb (71.7 kg), currently breastfeeding. VS reviewed, nursing note reviewed,  Constitutional: well developed, well nourished, no distress HEENT: normocephalic CV: normal rate Pulm/chest wall: normal effort Breast Exam: deferred Abdomen: soft Neuro: alert and oriented x 3 Skin: warm, dry Psych: affect normal Pelvic exam: Perineal tear well approximated, mild erythema, no edema noted.  Pt with sensitivity/mild pain to palpation and firm knot of tissue, ~ 0.25 cm in diameter noted to left side of perineum.   Assessment & Plan:  1. Postpartum perineal pain --Continues, improved some. Pt sensitive at perineum when showering and when wiping after going to the bathroom.  Pt following up with urogyn next week.   --IUD appt in 1 week with me, then decide about follow up based on need --Recommend continue perineal massage and Epsom salt warm baths --Scar tissue is present, likely reaction to suture, which is no longer present.  Reassurance provided that this  will soften with time and sensitivity in that area should improve.  2. Postpartum care following vaginal delivery   3. Encounter for counseling regarding contraception --Pt desires IUD, was undecided until today between Nexplanon and IUD.  Desires Liletta/Mirena. --Due to time constraints today and pt has appt for baby after today's appt, will reschedule for IUD insertion next week --Questions answered about IUD, insertion, and breastfeeding today  Sharen Counter, CNM 9:28 AM

## 2020-11-20 NOTE — Progress Notes (Signed)
Pt states she is still having some perineal pain, has been seen by urogyn.  Pt would like exam today.  Pt would like to discuss BC option, may want IUD.

## 2020-11-28 ENCOUNTER — Ambulatory Visit (INDEPENDENT_AMBULATORY_CARE_PROVIDER_SITE_OTHER): Payer: 59 | Admitting: Internal Medicine

## 2020-11-28 ENCOUNTER — Other Ambulatory Visit: Payer: Self-pay

## 2020-11-28 ENCOUNTER — Encounter: Payer: Self-pay | Admitting: Advanced Practice Midwife

## 2020-11-28 ENCOUNTER — Encounter: Payer: Self-pay | Admitting: Internal Medicine

## 2020-11-28 ENCOUNTER — Ambulatory Visit (INDEPENDENT_AMBULATORY_CARE_PROVIDER_SITE_OTHER): Payer: 59 | Admitting: Advanced Practice Midwife

## 2020-11-28 VITALS — BP 120/66 | Ht 65.0 in | Wt 157.0 lb

## 2020-11-28 VITALS — BP 116/76 | HR 61 | Temp 98.3°F | Ht 65.0 in | Wt 159.0 lb

## 2020-11-28 DIAGNOSIS — Z3202 Encounter for pregnancy test, result negative: Secondary | ICD-10-CM | POA: Diagnosis not present

## 2020-11-28 DIAGNOSIS — Z0000 Encounter for general adult medical examination without abnormal findings: Secondary | ICD-10-CM

## 2020-11-28 DIAGNOSIS — Z30430 Encounter for insertion of intrauterine contraceptive device: Secondary | ICD-10-CM | POA: Diagnosis not present

## 2020-11-28 DIAGNOSIS — D539 Nutritional anemia, unspecified: Secondary | ICD-10-CM | POA: Diagnosis not present

## 2020-11-28 DIAGNOSIS — E039 Hypothyroidism, unspecified: Secondary | ICD-10-CM | POA: Diagnosis not present

## 2020-11-28 DIAGNOSIS — Z975 Presence of (intrauterine) contraceptive device: Secondary | ICD-10-CM | POA: Insufficient documentation

## 2020-11-28 LAB — POCT URINE PREGNANCY: Preg Test, Ur: NEGATIVE

## 2020-11-28 LAB — LIPID PANEL
Cholesterol: 195 mg/dL (ref 0–200)
HDL: 60.3 mg/dL (ref >39.00)
LDL Cholesterol: 115 mg/dL — ABNORMAL HIGH (ref 0–99)
NonHDL: 135.03
Total CHOL/HDL Ratio: 3
Triglycerides: 100 mg/dL (ref 0.0–149.0)
VLDL: 20 mg/dL (ref 0.0–40.0)

## 2020-11-28 LAB — FERRITIN: Ferritin: 28.1 ng/mL (ref 10.0–291.0)

## 2020-11-28 LAB — CBC WITH DIFFERENTIAL/PLATELET
Basophils Absolute: 0 10*3/uL (ref 0.0–0.1)
Basophils Relative: 0.9 % (ref 0.0–3.0)
Eosinophils Absolute: 0.2 10*3/uL (ref 0.0–0.7)
Eosinophils Relative: 5.5 % — ABNORMAL HIGH (ref 0.0–5.0)
HCT: 41.7 % (ref 36.0–46.0)
Hemoglobin: 14 g/dL (ref 12.0–15.0)
Lymphocytes Relative: 25 % (ref 12.0–46.0)
Lymphs Abs: 1.1 10*3/uL (ref 0.7–4.0)
MCHC: 33.7 g/dL (ref 30.0–36.0)
MCV: 81.9 fl (ref 78.0–100.0)
Monocytes Absolute: 0.4 10*3/uL (ref 0.1–1.0)
Monocytes Relative: 9.6 % (ref 3.0–12.0)
Neutro Abs: 2.7 10*3/uL (ref 1.4–7.7)
Neutrophils Relative %: 59 % (ref 43.0–77.0)
Platelets: 237 10*3/uL (ref 150.0–400.0)
RBC: 5.09 Mil/uL (ref 3.87–5.11)
RDW: 12.5 % (ref 11.5–15.5)
WBC: 4.5 10*3/uL (ref 4.0–10.5)

## 2020-11-28 LAB — IRON: Iron: 94 ug/dL (ref 42–145)

## 2020-11-28 LAB — TSH: TSH: 0.09 u[IU]/mL — ABNORMAL LOW (ref 0.35–4.50)

## 2020-11-28 MED ORDER — LEVOTHYROXINE SODIUM 50 MCG PO TABS: 50.0000 ug | ORAL_TABLET | Freq: Every day | ORAL | 0 refills | Status: DC

## 2020-11-28 MED ORDER — LEVONORGESTREL 20 MCG/24HR IU IUD
INTRAUTERINE_SYSTEM | Freq: Once | INTRAUTERINE | Status: AC
Start: 2020-11-28 — End: 2020-11-28

## 2020-11-28 NOTE — Progress Notes (Signed)
GYNECOLOGY OFFICE PROCEDURE NOTE  [name] is a 35 y.o. G1P1001 here for Liletta IUD insertion. No GYN concerns.  Last pap smear was on 10/26/2019, see transfer OB records, and was normal.  IUD Insertion Procedure Note Patient identified, informed consent performed, consent signed.   Discussed risks of irregular bleeding, cramping, infection, malpositioning or misplacement of the IUD outside the uterus which may require further procedure such as laparoscopy. Time out was performed.  Urine pregnancy test negative.  Speculum placed in the vagina.  Cervix visualized.  Cleaned with Betadine x 2.  Grasped anteriorly with a single tooth tenaculum.  Uterus sounded to 7 cm.  Liletta IUD placed per manufacturer's recommendations.  Strings trimmed to 3-4 cm. Tenaculum was removed, good hemostasis noted.  Patient tolerated procedure well.   Patient was given post-procedure instructions.  She was advised to have backup contraception for one week.  Patient was also asked to check IUD strings periodically and follow up in 4 weeks for IUD check.  Sharen Counter, CNM 2:23 PM

## 2020-11-28 NOTE — Patient Instructions (Signed)

## 2020-11-28 NOTE — Progress Notes (Signed)
Subjective:  Patient ID: Brooke Sherman, female    DOB: 1985-12-29  Age: 35 y.o. MRN: [id number]  CC: Anemia, Annual Exam, and Hypothyroidism  This visit occurred during the SARS-CoV-2 public health emergency.  Safety protocols were in place, including screening questions prior to the visit, additional usage of staff PPE, and extensive cleaning of exam room while observing appropriate contact time as indicated for disinfecting solutions.    HPI Brooke Sherman presents for a CPX and to establish.  Brooke Sherman is about 2 months postpartum.  Brooke Sherman is breast-feeding.  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  Brooke Sherman is not currently having cycles.  Brooke Sherman denies any symptoms of anemia.  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  Brooke Sherman feels well on her current thyroid supplement.  History Brooke Sherman has a past medical history of Asthma and Hypothyroid.   Brooke Sherman has a past surgical history that includes Anterior cruciate ligament repair (Left, 2019) and Wisdom tooth extraction.   Her family history includes Cervical cancer in her mother.Brooke Sherman reports that Brooke Sherman has never smoked. Brooke Sherman has never used smokeless tobacco. Brooke Sherman reports current alcohol use. Brooke Sherman reports previous drug use.  Outpatient Medications Prior to Visit  Medication Sig Dispense Refill  . Prenatal Vit-Fe Fumarate-FA (PRENATAL VITAMINS PO) Take by mouth.    . levothyroxine (SYNTHROID) 75 MCG tablet Take 1 tablet (75 mcg total) by mouth daily before breakfast. 30 tablet 3  . lidocaine (XYLOCAINE) 2 % jelly Apply 1 application topically 3 (three) times daily. 30 mL 0   No facility-administered medications prior to visit.    ROS Review of Systems  Constitutional: Negative for appetite change, diaphoresis and fatigue.  HENT: Negative.   Eyes: Negative for visual disturbance.  Respiratory: Negative for cough, chest tightness, shortness of breath and wheezing.   Cardiovascular: Negative for chest pain, palpitations and leg  swelling.  Gastrointestinal: Negative for abdominal pain, constipation, diarrhea, nausea and vomiting.  Endocrine: Negative for cold intolerance and heat intolerance.  Genitourinary: Negative.   Musculoskeletal: Negative for arthralgias and myalgias.  Skin: Negative.  Negative for color change and pallor.  Neurological: Negative.  Negative for dizziness and weakness.  Hematological: Negative for adenopathy. Does not bruise/bleed easily.  Psychiatric/Behavioral: Negative.     Objective:  BP 116/76   Pulse 61   Temp 98.3 F (36.8 C) (Oral)   Ht 5\' 5"  (1.651 m)   Wt 159 lb (72.1 kg)   SpO2 96%   BMI 26.46 kg/m   Physical Exam Vitals reviewed.  Constitutional:      Appearance: Normal appearance.  HENT:     Nose: Nose normal.     Mouth/Throat:     Mouth: Mucous membranes are moist.  Eyes:     General: No scleral icterus.    Conjunctiva/sclera: Conjunctivae normal.  Neck:     Thyroid: No thyroid mass, thyromegaly or thyroid tenderness.  Cardiovascular:     Rate and Rhythm: Normal rate and regular rhythm.     Heart sounds: No murmur heard.   Pulmonary:     Effort: Pulmonary effort is normal.     Breath sounds: No stridor. No wheezing, rhonchi or rales.  Abdominal:     General: Abdomen is flat.     Palpations: There is no mass.     Tenderness: There is no abdominal tenderness. There is no guarding.     Hernia: No hernia is present.  Musculoskeletal:        General: Normal range of motion.  Cervical back: Neck supple.  Lymphadenopathy:     Cervical: No cervical adenopathy.  Skin:    General: Skin is warm and dry.  Neurological:     General: No focal deficit present.     Mental Status: Brooke Sherman is alert.  Psychiatric:        Mood and Affect: Mood normal.        Behavior: Behavior normal.     Lab Results  Component Value Date   WBC 4.5 11/28/2020   HGB 14.0 11/28/2020   HCT 41.7 11/28/2020   PLT 237.0 11/28/2020   CHOL 195 11/28/2020   TRIG 100.0 11/28/2020    HDL 60.30 11/28/2020   LDLCALC 115 (H) 11/28/2020   TSH 0.09 (L) 11/28/2020    Assessment & Plan:   Brooke Sherman was seen today for anemia, annual exam and hypothyroidism.  Diagnoses and all orders for this visit:  Acquired hypothyroidism- Her TSH is suppressed down to 0.09.  I recommended that Brooke Sherman decrease her T4 dosage to 50 mcg a day. -     TSH; Future -     TSH -     levothyroxine (SYNTHROID) 50 MCG tablet; Take 1 tablet (50 mcg total) by mouth daily before breakfast.  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 -     CBC with Differential/Platelet; Future -     Iron; Future -     Ferritin; Future -     Ferritin -     Iron -     CBC with Differential/Platelet  Routine general medical examination at a health care facility- Exam completed, labs reviewed, vaccines are up-to-date, cancer screenings are up-to-date, patient education was given. -     Lipid panel; Future -     Lipid panel   I have discontinued Brooke Sherman. Brooke Sherman's levothyroxine and lidocaine. I am also having her start on levothyroxine. Additionally, I am having her maintain her Prenatal Vit-Fe Fumarate-FA (PRENATAL VITAMINS PO).  Meds ordered this encounter  Medications  . levothyroxine (SYNTHROID) 50 MCG tablet    Sig: Take 1 tablet (50 mcg total) by mouth daily before breakfast.    Dispense:  90 tablet    Refill:  0     Follow-up: Return in about 6 months (around 05/31/2021).  Sanda Linger, MD

## 2020-11-28 NOTE — Patient Instructions (Signed)
Health Maintenance, Female Adopting a healthy lifestyle and getting preventive care are important in promoting health and wellness. Ask your health care provider about:  The right schedule for you to have regular tests and exams.  Things you can do on your own to prevent diseases and keep yourself healthy. What should I know about diet, weight, and exercise? Eat a healthy diet  Eat a diet that includes plenty of vegetables, fruits, low-fat dairy products, and lean protein.  Do not eat a lot of foods that are high in solid fats, added sugars, or sodium.   Maintain a healthy weight Body mass index (BMI) is used to identify weight problems. It estimates body fat based on height and weight. Your health care provider can help determine your BMI and help you achieve or maintain a healthy weight. Get regular exercise Get regular exercise. This is one of the most important things you can do for your health. Most adults should:  Exercise for at least 150 minutes each week. The exercise should increase your heart rate and make you sweat (moderate-intensity exercise).  Do strengthening exercises at least twice a week. This is in addition to the moderate-intensity exercise.  Spend less time sitting. Even light physical activity can be beneficial. Watch cholesterol and blood lipids Have your blood tested for lipids and cholesterol at 35 years of age, then have this test every 5 years. Have your cholesterol levels checked more often if:  Your lipid or cholesterol levels are high.  You are older than 35 years of age.  You are at high risk for heart disease. What should I know about cancer screening? Depending on your health history and family history, you may need to have cancer screening at various ages. This may include screening for:  Breast cancer.  Cervical cancer.  Colorectal cancer.  Skin cancer.  Lung cancer. What should I know about heart disease, diabetes, and high blood  pressure? Blood pressure and heart disease  High blood pressure causes heart disease and increases the risk of stroke. This is more likely to develop in people who have high blood pressure readings, are of African descent, or are overweight.  Have your blood pressure checked: ? Every 3-5 years if you are 18-39 years of age. ? Every year if you are 40 years old or older. Diabetes Have regular diabetes screenings. This checks your fasting blood sugar level. Have the screening done:  Once every three years after age 40 if you are at a normal weight and have a low risk for diabetes.  More often and at a younger age if you are overweight or have a high risk for diabetes. What should I know about preventing infection? Hepatitis B If you have a higher risk for hepatitis B, you should be screened for this virus. Talk with your health care provider to find out if you are at risk for hepatitis B infection. Hepatitis C Testing is recommended for:  Everyone born from 1945 through 1965.  Anyone with known risk factors for hepatitis C. Sexually transmitted infections (STIs)  Get screened for STIs, including gonorrhea and chlamydia, if: ? You are sexually active and are younger than 35 years of age. ? You are older than 35 years of age and your health care provider tells you that you are at risk for this type of infection. ? Your sexual activity has changed since you were last screened, and you are at increased risk for chlamydia or gonorrhea. Ask your health care provider   if you are at risk.  Ask your health care provider about whether you are at high risk for HIV. Your health care provider may recommend a prescription medicine to help prevent HIV infection. If you choose to take medicine to prevent HIV, you should first get tested for HIV. You should then be tested every 3 months for as long as you are taking the medicine. Pregnancy  If you are about to stop having your period (premenopausal) and  you may become pregnant, seek counseling before you get pregnant.  Take 400 to 800 micrograms (mcg) of folic acid every day if you become pregnant.  Ask for birth control (contraception) if you want to prevent pregnancy. Osteoporosis and menopause Osteoporosis is a disease in which the bones lose minerals and strength with aging. This can result in bone fractures. If you are 65 years old or older, or if you are at risk for osteoporosis and fractures, ask your health care provider if you should:  Be screened for bone loss.  Take a calcium or vitamin D supplement to lower your risk of fractures.  Be given hormone replacement therapy (HRT) to treat symptoms of menopause. Follow these instructions at home: Lifestyle  Do not use any products that contain nicotine or tobacco, such as cigarettes, e-cigarettes, and chewing tobacco. If you need help quitting, ask your health care provider.  Do not use street drugs.  Do not share needles.  Ask your health care provider for help if you need support or information about quitting drugs. Alcohol use  Do not drink alcohol if: ? Your health care provider tells you not to drink. ? You are pregnant, may be pregnant, or are planning to become pregnant.  If you drink alcohol: ? Limit how much you use to 0-1 drink a day. ? Limit intake if you are breastfeeding.  Be aware of how much alcohol is in your drink. In the [place] one drink equals one 12 oz bottle of beer (355 mL), one 5 oz glass of wine (148 mL), or one 1 oz glass of hard liquor (44 mL). General instructions  Schedule regular health, dental, and eye exams.  Stay current with your vaccines.  Tell your health care provider if: ? You often feel depressed. ? You have ever been abused or do not feel safe at home. Summary  Adopting a healthy lifestyle and getting preventive care are important in promoting health and wellness.  Follow your health care provider's instructions about healthy  diet, exercising, and getting tested or screened for diseases.  Follow your health care provider's instructions on monitoring your cholesterol and blood pressure. This information is not intended to replace advice given to you by your health care provider. Make sure you discuss any questions you have with your health care provider. Document Revised: 09/09/2018 Document Reviewed: 09/09/2018 Elsevier Patient Education  2021 [employer]  

## 2020-11-28 NOTE — Progress Notes (Signed)
[facility] Return Visit  SUBJECTIVE  History of Present Illness: Christene Pounds is a 35 y.o. female seen in follow-up for perineal pain. At last visit, areas of granulation tissue were cauterized and she was prescribed lidocaine jelly. Has not really needed the lidocaine jelly. Has been working on perineal/ pelvic floor massage and that has helped a lot with the scarring. She has been able to sit and walk without discomfort. Has had intercourse and it was uncomfortable, used lubricant.    Past Medical History: Patient  has a past medical history of Asthma and Hypothyroid.   Past Surgical History: She  has a past surgical history that includes Anterior cruciate ligament repair (Left, 2019) and Wisdom tooth extraction.   Medications: She has a current medication list which includes the following prescription(s): levothyroxine and prenatal vit-fe fumarate-fa.   Allergies: Patient has No Known Allergies.   Social History: Patient  reports that she has never smoked. She has never used smokeless tobacco. She reports current alcohol use. She reports previous drug use.      OBJECTIVE     Physical Exam: Vitals:   11/29/20 1007  BP: 112/74  Pulse: 63  Weight: 158 lb (71.7 kg)  Height: 5\' 5"  (1.651 m)   Gen: No apparent distress, A&O x 3.  Detailed Urogynecologic Evaluation:  Normal external genitalia. Well healed perineal body, small amount of scar tissue present with no granulation tissue. Speculum exam revealed no granulation tissue in previous area at 7 o'clock. Small band of scarring still present on digital palpation, non-tender.   Rectal exam: Intact perineal body, good sphincter tone   ASSESSMENT AND PLAN    Ms. Flammia is a 35 y.o. with:  1. Postpartum perineal pain    - Significant improvement, no more granulation tissue present - Continue with perineal massage. Offered pelvic floor PT but she is not interested at this time.  - For  intercourse, she should continue to use lubricant, either a water based OTC or coconut oil. Also discussed that breastfeeding can cause vaginal atrophy and the use of vaginal estrogen is also an option. She will start with a lubricant for now.   Follow up as needed.   20, MD  Time spent: I spent 25 minutes dedicated to the care of this patient on the date of this encounter to include pre-visit review of records, face-to-face time with the patient and post visit documentation.

## 2020-11-28 NOTE — Progress Notes (Signed)
Pt here today for Mirena IUD insertion. No LMP since delivery. Pt is currently breastfeeding.

## 2020-11-29 ENCOUNTER — Ambulatory Visit (INDEPENDENT_AMBULATORY_CARE_PROVIDER_SITE_OTHER): Payer: 59 | Admitting: Obstetrics and Gynecology

## 2020-11-29 ENCOUNTER — Encounter: Payer: Self-pay | Admitting: Obstetrics and Gynecology

## 2020-11-29 VITALS — BP 112/74 | HR 63 | Ht 65.0 in | Wt 158.0 lb

## 2020-11-29 DIAGNOSIS — O9089 Other complications of the puerperium, not elsewhere classified: Secondary | ICD-10-CM | POA: Diagnosis not present

## 2020-11-29 DIAGNOSIS — R102 Pelvic and perineal pain: Secondary | ICD-10-CM | POA: Diagnosis not present

## 2020-12-13 ENCOUNTER — Ambulatory Visit (INDEPENDENT_AMBULATORY_CARE_PROVIDER_SITE_OTHER): Payer: 59 | Admitting: Internal Medicine

## 2020-12-13 ENCOUNTER — Encounter: Payer: Self-pay | Admitting: Internal Medicine

## 2020-12-13 ENCOUNTER — Other Ambulatory Visit: Payer: Self-pay

## 2020-12-13 VITALS — BP 104/72 | HR 82 | Temp 98.3°F | Ht 65.0 in | Wt 159.0 lb

## 2020-12-13 DIAGNOSIS — R202 Paresthesia of skin: Secondary | ICD-10-CM

## 2020-12-13 DIAGNOSIS — M5414 Radiculopathy, thoracic region: Secondary | ICD-10-CM

## 2020-12-13 DIAGNOSIS — M5030 Other cervical disc degeneration, unspecified cervical region: Secondary | ICD-10-CM | POA: Insufficient documentation

## 2020-12-13 DIAGNOSIS — M5412 Radiculopathy, cervical region: Secondary | ICD-10-CM | POA: Diagnosis not present

## 2020-12-13 DIAGNOSIS — R200 Anesthesia of skin: Secondary | ICD-10-CM | POA: Insufficient documentation

## 2020-12-13 NOTE — Progress Notes (Signed)
Subjective:  Patient ID: Brooke Sherman, female    DOB: [birth year]  Age: 35 y.o. MRN: [id number]  CC: Follow-up  This visit occurred during the SARS-CoV-2 public health emergency.  Safety protocols were in place, including screening questions prior to the visit, additional usage of staff PPE, and extensive cleaning of exam room while observing appropriate contact time as indicated for disinfecting solutions.    HPI Brooke Sherman presents for f/up -  She has had chronic neck pain for more than a year.  In December 2020 she had plain films done in [place] that showed osteophytes in her cervical spine.  Over the last few months she has had worsening neck pain.  She is taking Motrin for relief of the discomfort.  Her new symptoms are numbness and tingling in both hands worse on the right than the left.  She previously treated this with physical therapy.  She also has pain in between her shoulder blades.  Outpatient Medications Prior to Visit  Medication Sig Dispense Refill   levothyroxine (SYNTHROID) 50 MCG tablet Take 1 tablet (50 mcg total) by mouth daily before breakfast. 90 tablet 0   Prenatal Vit-Fe Fumarate-FA (PRENATAL VITAMINS PO) Take by mouth.     No facility-administered medications prior to visit.    ROS Review of Systems  Constitutional: Negative.   HENT: Negative for trouble swallowing.   Eyes: Negative.   Respiratory: Negative for cough.   Cardiovascular: Negative for chest pain.  Gastrointestinal: Negative for abdominal pain and nausea.  Endocrine: Negative.   Genitourinary: Negative.   Musculoskeletal: Positive for back pain and neck pain.  Skin: Negative.   Neurological: Positive for numbness. Negative for dizziness and weakness.  Hematological: Negative for adenopathy. Does not bruise/bleed easily.  Psychiatric/Behavioral: Negative.     Objective:  BP 104/72    Pulse 82    Temp 98.3 F (36.8 C) (Oral)    Ht 5\' 5"  (1.651 m)     Wt 159 lb (72.1 kg)    LMP  (LMP Unknown)    SpO2 97%    BMI 26.46 kg/m   BP Readings from Last 3 Encounters:  12/13/20 104/72  11/29/20 112/74  11/28/20 120/66    Wt Readings from Last 3 Encounters:  12/13/20 159 lb (72.1 kg)  11/29/20 158 lb (71.7 kg)  11/28/20 157 lb (71.2 kg)    Physical Exam Vitals reviewed.  Constitutional:      Appearance: Normal appearance.  HENT:     Mouth/Throat:     Mouth: Mucous membranes are moist.  Eyes:     General: No scleral icterus.    Conjunctiva/sclera: Conjunctivae normal.  Cardiovascular:     Rate and Rhythm: Normal rate and regular rhythm.     Heart sounds: No murmur heard.   Pulmonary:     Effort: Pulmonary effort is normal.     Breath sounds: Normal breath sounds.  Abdominal:     General: Abdomen is flat.  Musculoskeletal:        General: Normal range of motion.     Cervical back: Normal and neck supple. No swelling, tenderness or bony tenderness. No pain with movement. Normal range of motion.     Thoracic back: Normal.     Lumbar back: Normal.  Lymphadenopathy:     Cervical: No cervical adenopathy.  Skin:    General: Skin is warm and dry.  Neurological:     Mental Status: She is alert.     Deep Tendon Reflexes: Reflexes  abnormal.     Reflex Scores:      Tricep reflexes are 0 on the right side and 0 on the left side.      Bicep reflexes are 1+ on the right side and 1+ on the left side.      Brachioradialis reflexes are 1+ on the right side and 2+ on the left side.      Patellar reflexes are 0 on the right side and 2+ on the left side.      Achilles reflexes are 0 on the right side and 1+ on the left side.    Lab Results  Component Value Date   WBC 4.5 11/28/2020   HGB 14.0 11/28/2020   HCT 41.7 11/28/2020   PLT 237.0 11/28/2020   CHOL 195 11/28/2020   TRIG 100.0 11/28/2020   HDL 60.30 11/28/2020   LDLCALC 115 (H) 11/28/2020   TSH 0.09 (L) 11/28/2020    No results found.  Assessment & Plan:   Brooke Sherman  was seen today for follow-up.  Diagnoses and all orders for this visit:  Radiculitis of right cervical region- She has worsening neck pain with paresthesias and abnormal reflexes. I have asked her to undergo an MRI of her C-spine to see if there is spinal stenosis, nerve impingement, a tumor, or demyelination. -     MR CERVICAL SPINE WO CONTRAST; Future  Thoracic radiculitis- See above. -     MR Thoracic Spine Wo Contrast; Future  Numbness and tingling in both hands -     MR Thoracic Spine Wo Contrast; Future -     MR CERVICAL SPINE WO CONTRAST; Future  Disc disease, degenerative, cervical -     MR CERVICAL SPINE WO CONTRAST; Future   I am having Brooke Sherman maintain her Prenatal Vit-Fe Fumarate-FA (PRENATAL VITAMINS PO) and levothyroxine.  No orders of the defined types were placed in this encounter.    Follow-up: No follow-ups on file.  Sanda Linger, MD

## 2020-12-14 ENCOUNTER — Encounter: Payer: Self-pay | Admitting: Internal Medicine

## 2020-12-26 NOTE — Progress Notes (Signed)
**Progress Notes**    ---    **Patient:** Sandy Martin, Sandy Martin     **Account Number:** [id number] **External MRN:** [id number]   **Provider:** Laddie Aquas, MD     **DOB:** October 03, 1985 **Age:** 40 Y **Sex:** Female   **Date:** 02/17/2018     **Phone:** [phone]   **CHN#:** [id number]     **Address:** [address], [address], [place], [id number]     **Pcp:** Leonarda Salon, MD        * * *        **Subjective:**        ---       **Chief Complaints:**    --- ---       1\. RASH ON CHEEK. 2. Evaluation of dermatologic issues.    --- ---      **HPI:**    _GENERAL_ :    Patient presents for evaluation and management of the following.    New patient.    ----    Concerns today:    1\. Rash    Location: R cheek only    Duration: ~3.5 months    Symptoms: not itchy or painful but bumpy    Current Treatments: none    Previous Treatments: none    Other:    - Seasonal allergies - takes antihistamines daily    2\. Also here for dark line appearing in R first digit    ----    Family history:    skin cancer - negative    atopic dermatitis - negative    ----    Social history: non smoker    ----    ROS: No other skin concerns. Patient feels well, denies fevers or chills.    --- ---       **Medical History:**        --- ---      **Medications:** Taking Allegra , Taking birth control pills , Taking  Levothroid (levothyroxine) , Medication List reviewed and reconciled with the  patient    --- ---       **Allergies:** N.K.D.A.    --- ---         **Objective:**        ---       **Vitals:** Pain scale 0.    --- ---         **Assessment:**        ---       **Assessment:**    1\. Dermatitis - L30.9 (Primary)    2\. 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 All findings  were negative in the physical exam above  other than what is noted below. Pertinent exam findings include (see embedded  into assessment below):    *****    ASSESSMENT:    1\. Dermatisis- right cheek with few erythematous papules coalescing into  plaques    - The pathophysiology of contact dermatitis was discussed with the patient,  including the complicated overlay between allergic contact dermatitis,  irritant contact dermatitis, atopic dermatitis, other skin conditions, and  environmental factors.    - Discussed patch testing process. All questions answered    - Patient deferred patch testing for now, instead will start topical steroids    -  Alclometasone 0.05% cream once to twice daily for 5 days a week. Stop when  clear and restarting as needed. For severe cases may use twice daily for 2  weeks straight. Side effects discussed with patient: skin thinning,  discoloration, rare systemic absorption.    2\. 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 Treatment options  (risks and benefits) discussed.    - Benign reassued, RTC sooner if anything changes    I, Megan McDonald, attest that this documentation has been prepared under the  direction and in the presence of Sheran Luz, MD, Electronically signed: Hardie Lora, Scribe.    I personally performed the services described in this documentation. All  medical record entries made by the scribe were at my direction and in my  presence. I have reviewed the chart and agree that the record reflects my  personal performance and is accurate and complete.    Sheran Luz, MD Attending Dermatologist.         **Plan:**        ---         **1\. Others**    Start Alclometasone Dipropionate Cream, 0.05 %, Apply to affected area once to  twice daily 5 days a week. Stop when clear. Restart as needed, Externally, as  directed, 14  days, 30 gms, Refills 0 .    ---      **Follow Up:** 2-3 months    --- ---    ---    ---          **Provider:** Laddie Aquas, MD    ---     **Patient:** Sandy Martin, Sandy Martin **DOB:** 10/31/1985 **Date:** 02/17/2018    ---    Electronically signed by Sheran Luz MD on 02/17/2018 at 05:02 PM EDT    Sign off status: Completed

## 2021-01-01 ENCOUNTER — Other Ambulatory Visit: Payer: Self-pay

## 2021-01-01 ENCOUNTER — Encounter: Payer: Self-pay | Admitting: Advanced Practice Midwife

## 2021-01-01 ENCOUNTER — Ambulatory Visit (INDEPENDENT_AMBULATORY_CARE_PROVIDER_SITE_OTHER): Payer: 59 | Admitting: Advanced Practice Midwife

## 2021-01-01 VITALS — BP 124/73 | HR 69 | Ht 65.0 in | Wt 156.0 lb

## 2021-01-01 DIAGNOSIS — Z30431 Encounter for routine checking of intrauterine contraceptive device: Secondary | ICD-10-CM | POA: Diagnosis not present

## 2021-01-01 NOTE — Patient Instructions (Signed)
Breastfeeding resources:  Www.kellymom.com Www.llli.org   Obstetrics: Normal and problem pregnancies (7th ed., pp. [phone]). [place], [place]: Elsevier."> WHO recommendations on postnatal care of the mother and newborn. [place], [place]: World Science writer. Retrieved from http://www.who.int/maternal_child_adolescent/documents/postnatal-care-recommendations/en/">    Exclusive Breastfeeding Exclusive breastfeeding means feeding a baby with breast milk only, except for vitamin and mineral drops or medicines. It is recommended that babies be exclusively breastfed for the first 6 months of life. Breastfeeding can continue until a baby is 1 year or older, if wanted by both mother and child. Exclusive breastfeeding for at least 6 months has many benefits for both the mother and the baby. How does exclusive breastfeeding benefit my baby?  It ensures your baby gets the best nutrition.  It helps develop your baby's disease-fighting system (immune system) by providing antibodies that help fight off germs.  It may lower your baby's risk for: ? Problems with the stomach and intestines. ? Allergies. ? Ear infections. ? Infections of the nose, throat, or airways (respiratory infections). ? Obesity. ? Diabetes. ? Sudden infant death syndrome (SIDS). How does exclusive breastfeeding benefit me?  It helps improve your recovery from giving birth by: ? Reducing blood loss after delivery. ? Speeding up how quickly your uterus heals. ? Reducing your risk of a type of depression that happens after giving birth (postpartum depression).  It increases the time before your menstrual periods return. This can help to delay pregnancy if you are not using birth control.  It creates a unique bond between you and your baby. Tips for exclusive breastfeeding  Start breastfeeding within your baby's first hour of life.  Avoid giving your baby infant formula, water, or solid food before he or she is 79  months old, unless told by your health care provider.  Feed your baby on demand. This means feeding anytime your child expresses signs of hunger. Doing this can help to keep up your milk supply. Signs of hunger include: ? Becoming more alert and active and moving restlessly. ? Rooting. This is when your baby turns his or her head from side to side looking for the breast. Sometimes babies will also start smacking their lips as though they are sucking. ? Bringing hands to the mouth. ? Crying. This is a late sign of hunger.  Limit the use of bottles and pacifiers during the first 3-4 weeks of breastfeeding. Doing so may encourage more effective sucking patterns and help establish a good milk supply.  Drink plenty of fluids so your urine is pale yellow.  Eat a healthy diet that includes fresh fruits and vegetables, whole grains, lean meat, fish, eggs, beans, nuts, seeds, and low-fat dairy products.  If you decide to bottle-feed: ? Continue to offer your baby breast milk by using a breast pump to keep up your milk supply. ? Pump after feedings and store extra breast milk. ? Offer only breast milk in a bottle. What happens if I start supplementing feedings? If you work outside the home, it may be hard to continue exclusive breastfeeding. However, you can make sure your baby continues to receive only breast milk if you pump on a schedule and provide breast milk through bottle feeding. Sometimes you may need to supplement feedings. Your health care provider may recommend giving your baby formula with breast milk or rehydration liquids with breast milk if your baby was born early (prematurely), is not gaining enough weight, or is showing signs of dehydration. If you start supplementing feedings, your baby will drink less  breast milk and your body will respond by making less breast milk. If you choose to supplement feedings but would like to keep up your milk supply so you can breastfeed your baby  exclusively later on, you can pump your breast milk on a schedule and give your baby breast milk by bottle.   Questions to ask your health care provider or lactation specialist  Is exclusive breastfeeding right for me?  How long should I exclusively breastfeed?  What support is available to help me in exclusive breastfeeding? Where to find support You can find support through: Health care providers and lactation specialists They can help by:  Giving you educational materials.  Giving you information about where you can get supplies such as breast pumps and nursing bras.  Providing you with counseling if you need emotional support.  Sharing feeding basics with you, such as effective positions for breastfeeding.  Working through feeding challenges with you. Your peers Your friends, family, and other women can help by:  Sharing their experiences and success stories.  Giving you new ideas.  Encouraging you to keep breastfeeding even when it feels difficult. Education programs These programs can help you prepare for breastfeeding before your baby is born. Educational programs include:  Classes.  Print handouts.  Videos.  Telephone support.  One-on-one instruction. Where to find more information [employer]: llli.org Contact a health care provider or lactation specialist if:  You feel like you want to stop breastfeeding or have become frustrated with breastfeeding.  Your child is not gaining weight.  Your child is more than 55 week old and wetting fewer than 6 diapers in a 24-hour period.  You are feeling sad and depressed. This may be a sign of postpartum depression. Summary  Exclusive breastfeeding means feeding a baby with breast milk only.  Exclusive breastfeeding for the first 6 months of your baby's life is recommended.  Exclusive breastfeeding provides many benefits for both you and your baby.  You can find support for breastfeeding  through health care providers, friends and family, and educational programs. This information is not intended to replace advice given to you by your health care provider. Make sure you discuss any questions you have with your health care provider. Document Revised: 03/07/2020 Document Reviewed: 03/07/2020 Elsevier Patient Education  2021 [employer].

## 2021-01-01 NOTE — Progress Notes (Signed)
   GYNECOLOGY CLINIC PROGRESS NOTE  History:  35 y.o. G1P1001 here at [facility] today for today for IUD string check; Liletta IUD was placed  11/28/20. No complaints about the IUD, no concerning side effects.  The following portions of the patient's history were reviewed and updated as appropriate: allergies, current medications, past family history, past medical history, past social history, past surgical history and problem list. Last pap smear on1/26/21 (see transfer OB records) was normal.  Review of Systems:  Pertinent items are noted in HPI.   Objective:  Physical Exam Blood pressure 124/73, pulse 69, height 5\' 5"  (1.651 m), weight 156 lb (70.8 kg), currently breastfeeding. Gen: NAD Abd: Soft, nontender and nondistended Pelvic: Normal appearing external genitalia; normal appearing vaginal mucosa and cervix.  IUD strings visualized, about 3 cm in length outside cervix.   Assessment & Plan:  Normal IUD check. Patient to keep IUD in place for five- seven years; can come in for removal if she desires pregnancy within the next seven years. Routine preventative health maintenance measures emphasized. --Questions answered about MRI/contrast during breastfeeding  , CNM 9:43 AM

## 2021-01-01 NOTE — Progress Notes (Signed)
Pt presents today for IUD string check. Mirena IUD inserted on 11/28/20. Pt states bleeding has slowed down. She is still breastfeeding.

## 2021-01-04 ENCOUNTER — Other Ambulatory Visit: Payer: Self-pay

## 2021-01-04 ENCOUNTER — Ambulatory Visit
Admission: RE | Admit: 2021-01-04 | Discharge: 2021-01-04 | Disposition: A | Discharge status: Home / Self Care | Payer: 59 | Source: Ambulatory Visit | Attending: Internal Medicine | Admitting: Internal Medicine

## 2021-01-04 DIAGNOSIS — M5414 Radiculopathy, thoracic region: Secondary | ICD-10-CM | POA: Diagnosis not present

## 2021-01-04 DIAGNOSIS — M5412 Radiculopathy, cervical region: Secondary | ICD-10-CM

## 2021-01-04 DIAGNOSIS — R202 Paresthesia of skin: Secondary | ICD-10-CM

## 2021-01-04 DIAGNOSIS — M5124 Other intervertebral disc displacement, thoracic region: Secondary | ICD-10-CM | POA: Diagnosis not present

## 2021-01-04 DIAGNOSIS — M5030 Other cervical disc degeneration, unspecified cervical region: Secondary | ICD-10-CM | POA: Diagnosis not present

## 2021-01-04 DIAGNOSIS — M5134 Other intervertebral disc degeneration, thoracic region: Secondary | ICD-10-CM | POA: Diagnosis not present

## 2021-01-04 IMAGING — MR MR THORACIC SPINE W/O CM
4 of 5 series · 13 of 48 positions shown · non-contrast
Comparison: None.

CLINICAL DATA: Radiculitis of right cervical region. Numbness and
tingling of both hands. Disc disease, degenerative, cervical.
Thoracic radiculitis.

EXAM:
MRI CERVICAL AND THORACIC SPINE WITHOUT CONTRAST
TECHNIQUE: Multiplanar and multiecho pulse sequences of the cervical spine, to
include the craniocervical junction and cervicothoracic junction,
and the thoracic spine, were obtained without intravenous contrast.

[Series 3: T2 · sagittal · 4.0mm · 0.36mm/px · 4 of 12 slices shown (1 of 3)]
[im 1/12]
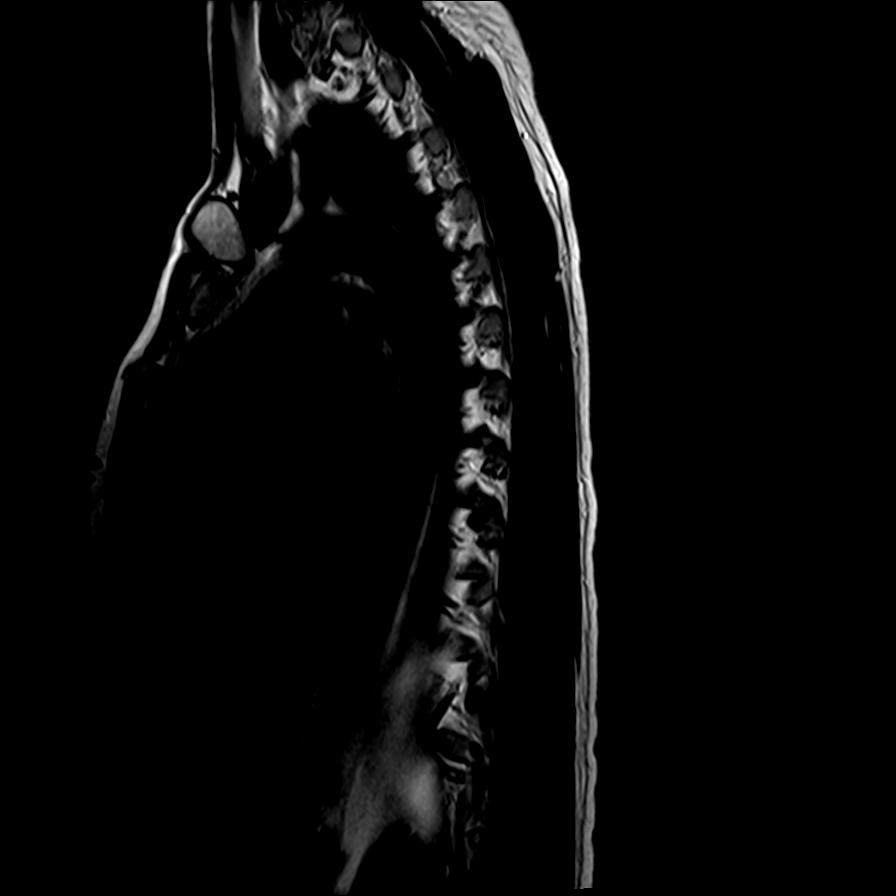
[im 3/12]
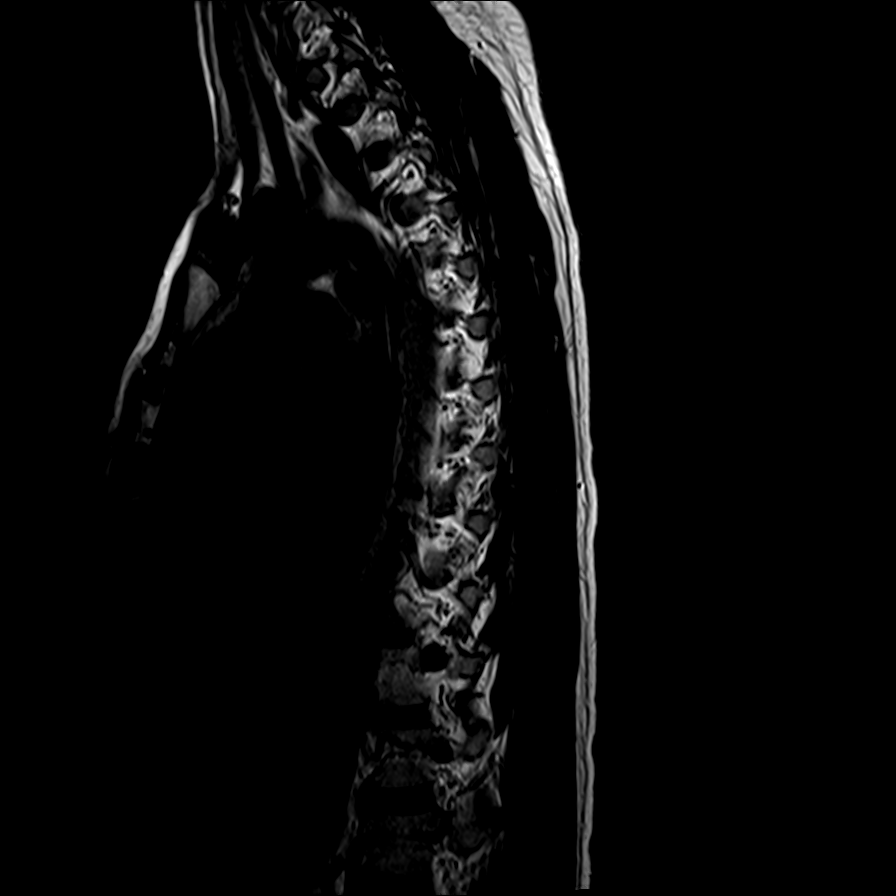
[im 6/12]
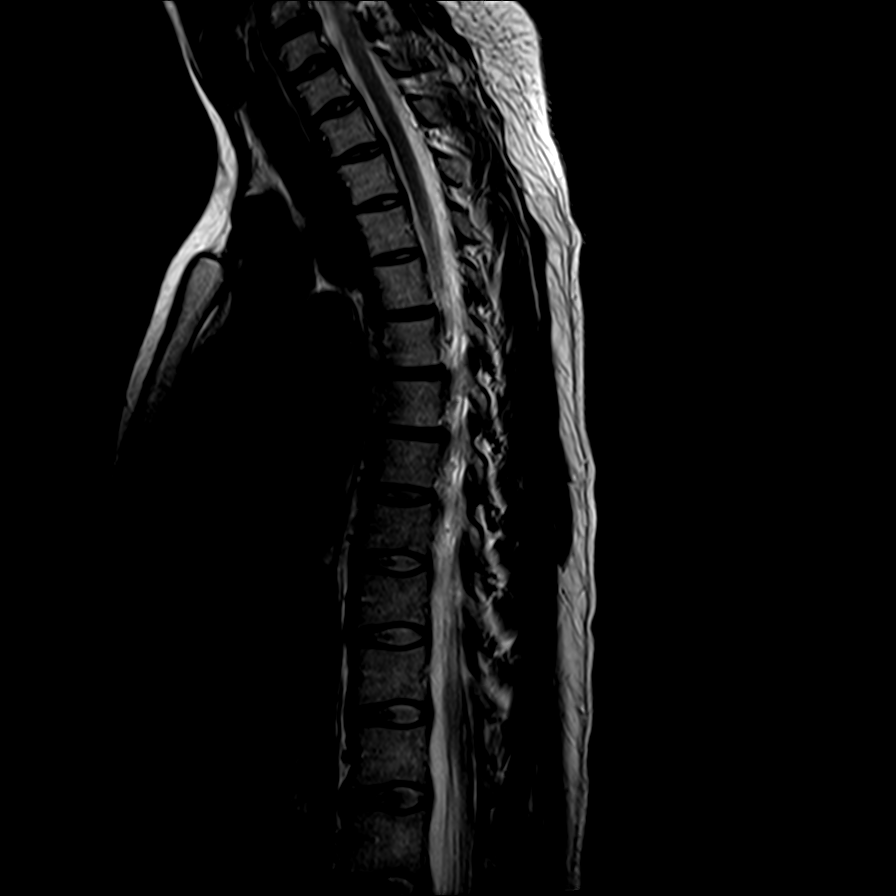
[im 12/12]
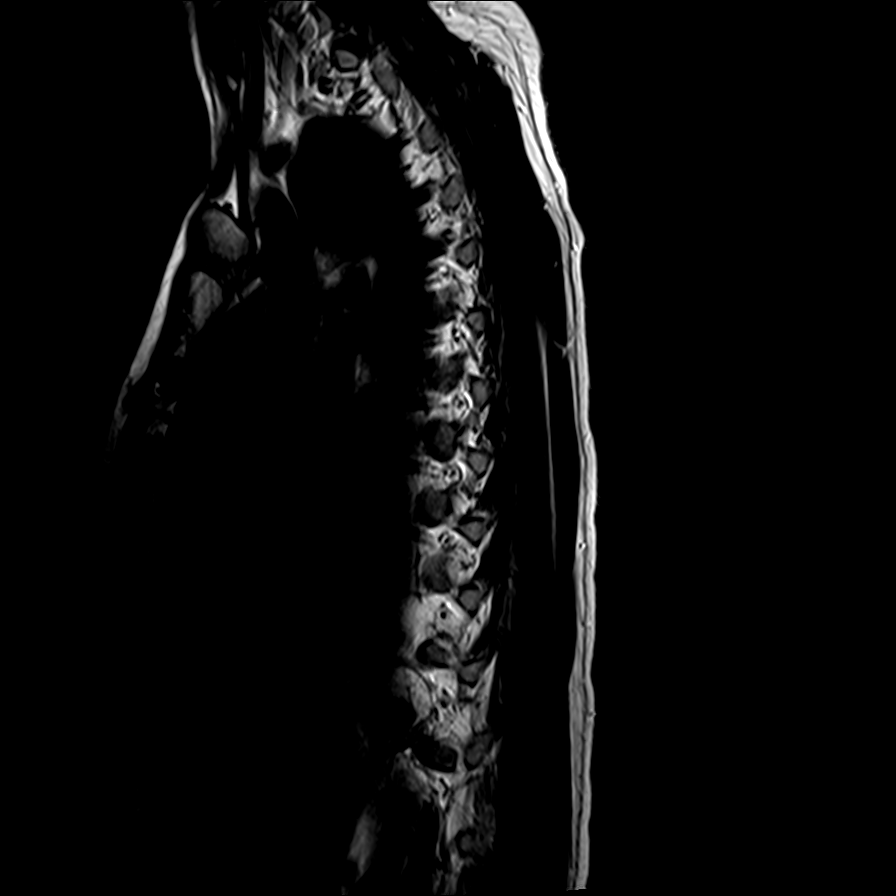

[Series 5: T1 · sagittal · 4.0mm · 0.42mm/px · 3 of 12 slices shown]
[im 3/12]
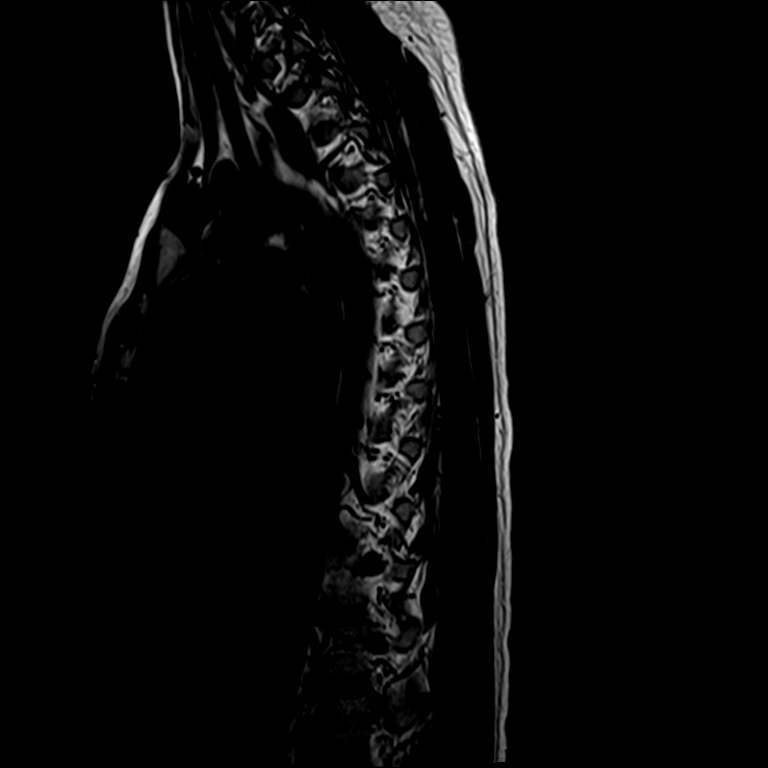
[im 7/12]
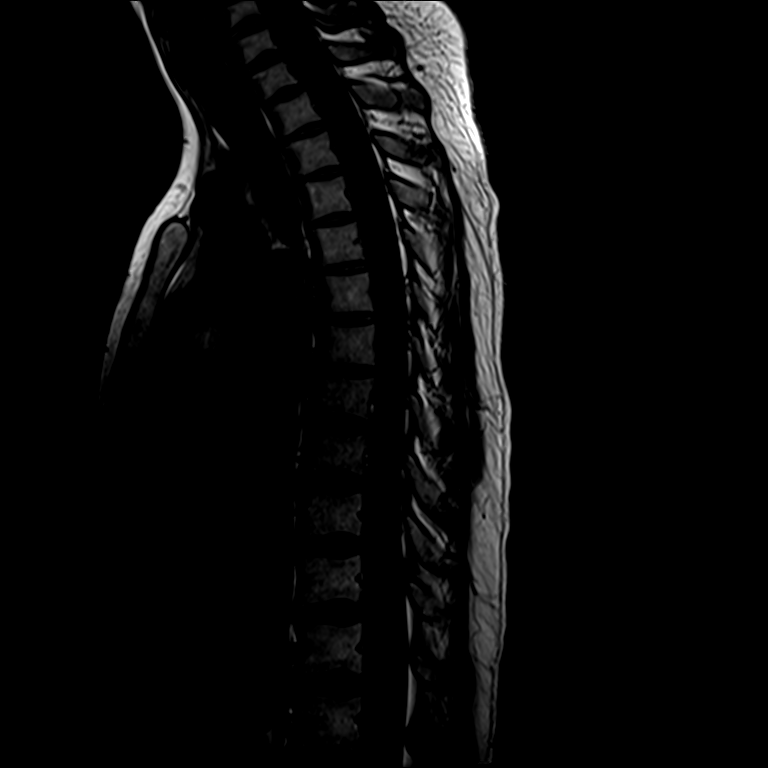
[im 12/12]
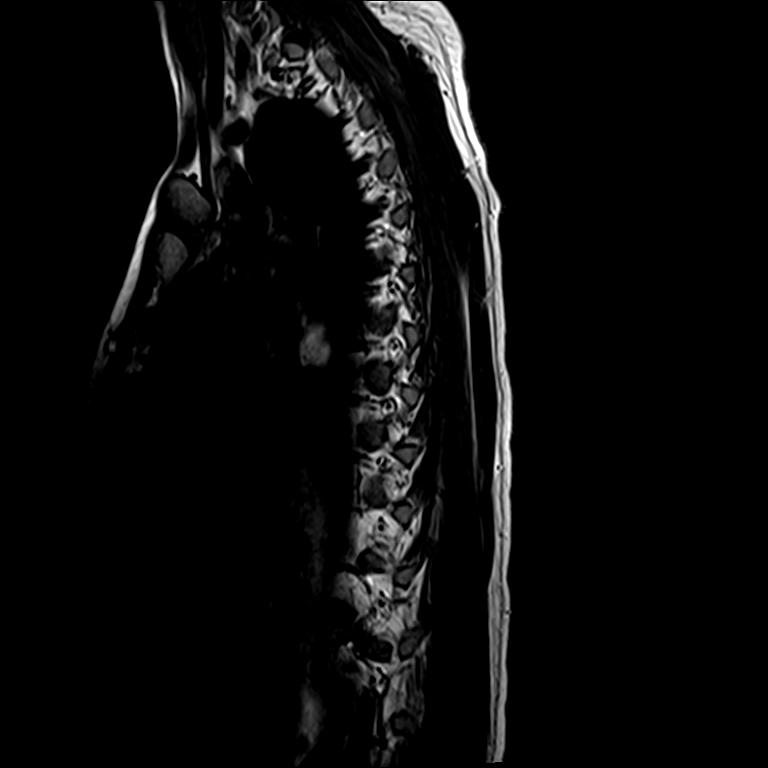

[Series 6: T2 · axial · 4.0mm · 0.78mm/px · z∈[-174,-34]mm · 3 of 33 slices shown (2 of 3)]
[im 5/33]
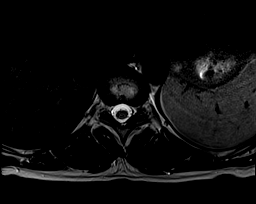
[im 18/33]
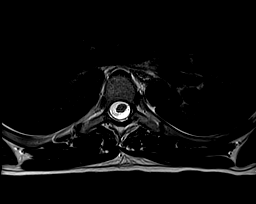
[im 28/33]
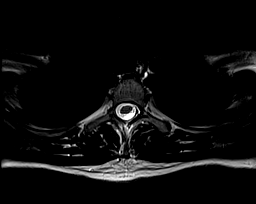

[Series 7: T2 · axial · 4.0mm · 0.78mm/px · z∈[-174,-34]mm · 3 of 33 slices shown (3 of 3)]
[im 5/33]
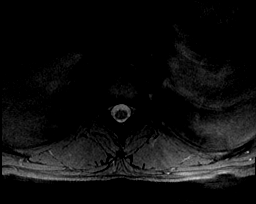
[im 18/33]
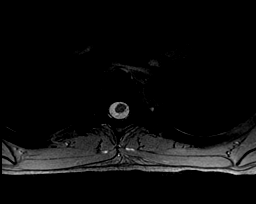
[im 28/33]
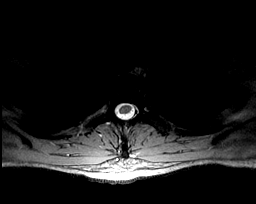

[13 of 48 positions shown; findings below may reference images not displayed]

FINDINGS: MRI CERVICAL SPINE FINDINGS

Alignment: Straightening of the cervical curvature.  She

Vertebrae: No fracture, evidence of discitis, or bone lesion.

Cord: Normal signal and morphology.

Posterior Fossa, vertebral arteries, paraspinal tissues: Negative.

Disc levels:

No significant disc herniation, spinal canal or neural foraminal
stenosis the cervical spine.

MRI THORACIC SPINE FINDINGS

Alignment: Minimal dextroconvex curvature of the upper thoracic
spine.

Vertebrae: No fracture, evidence of discitis, or bone lesion.

Cord:  Normal signal and morphology.

Paraspinal and other soft tissues: Negative.

Disc levels:

Small posterior disc protrusion at T2-3, T3-4 and T7-8 causing
minimal indentation on the thecal sac.

No significant spinal canal or neural foraminal stenosis at any
thoracic level.
IMPRESSION: 1. No significant spinal canal or neural foraminal stenosis at any
cervical or thoracic level.
2. No spinal cord signal abnormality.

## 2021-01-04 IMAGING — MR MR CERVICAL SPINE W/O CM
5 series · 35 of 48 positions shown · non-contrast
Comparison: None.

CLINICAL DATA: Radiculitis of right cervical region. Numbness and
tingling of both hands. Disc disease, degenerative, cervical.
Thoracic radiculitis.

EXAM:
MRI CERVICAL AND THORACIC SPINE WITHOUT CONTRAST
TECHNIQUE: Multiplanar and multiecho pulse sequences of the cervical spine, to
include the craniocervical junction and cervicothoracic junction,
and the thoracic spine, were obtained without intravenous contrast.

[Series 2: T2 · sagittal · 3.0mm · 0.41mm/px · 8 of 13 slices shown (1 of 2)]
[im 1/13]
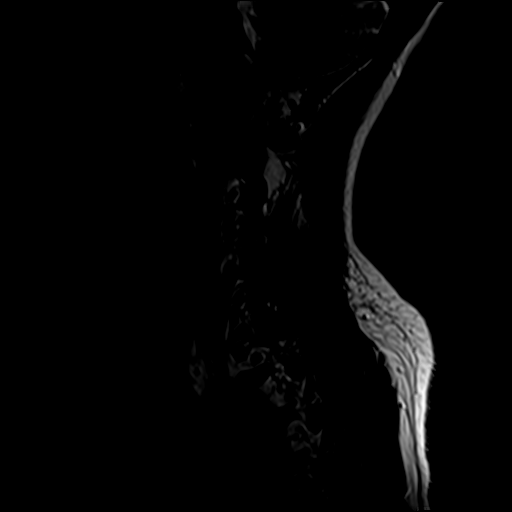
[im 2/13]
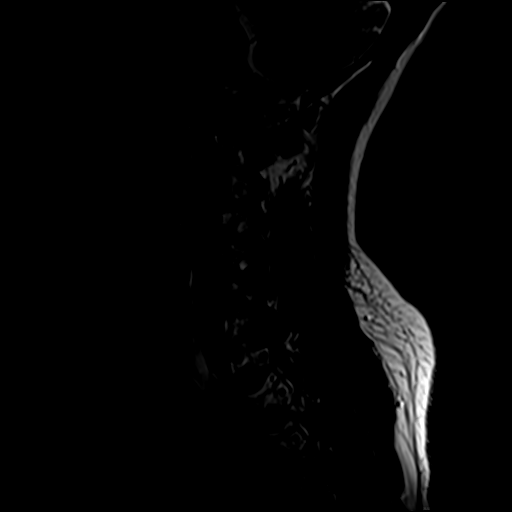
[im 4/13]
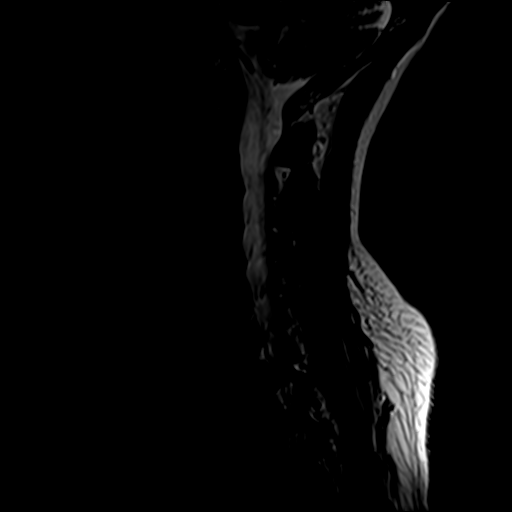
[im 6/13]
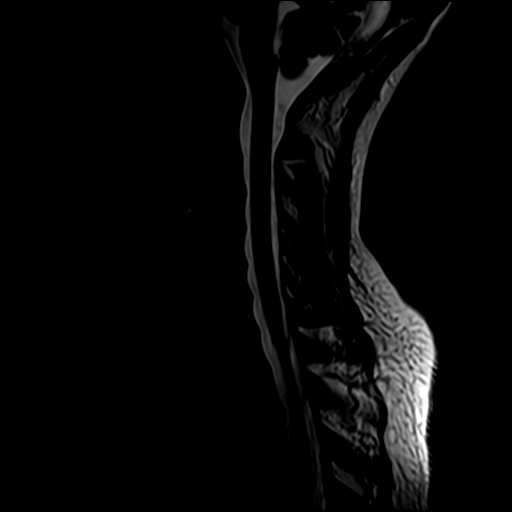
[im 7/13]
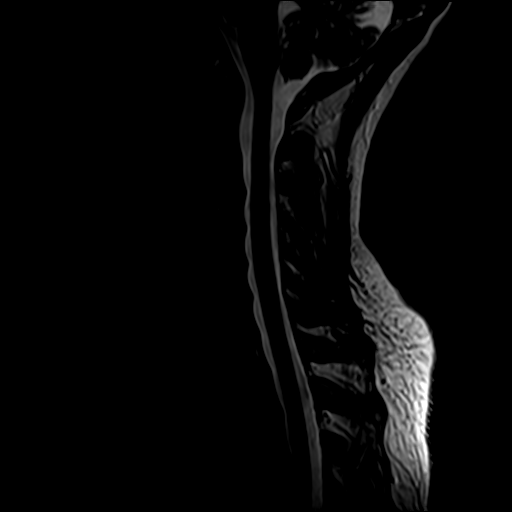
[im 9/13]
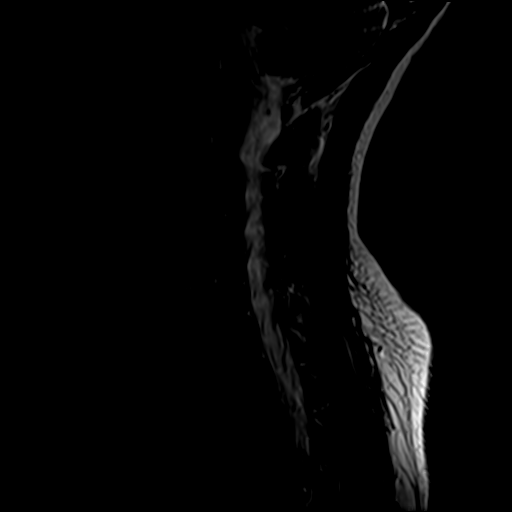
[im 11/13]
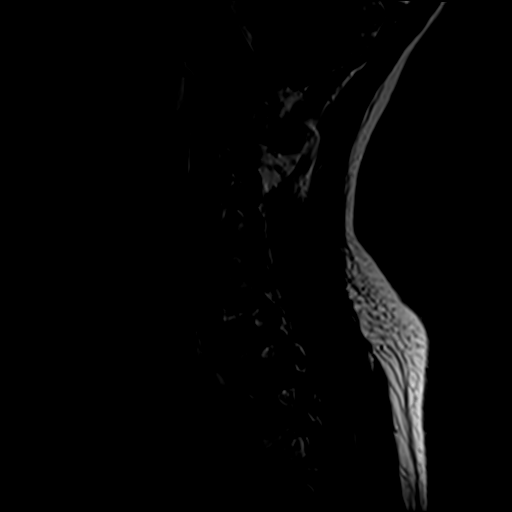
[im 13/13]
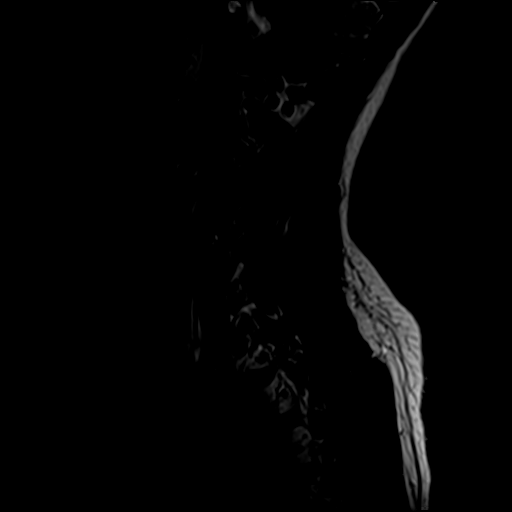

[Series 3: STIR · sagittal · 3.0mm · 0.82mm/px · 7 of 13 slices shown]
[im 1/13]
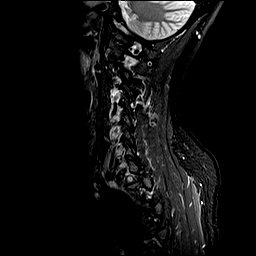
[im 3/13]
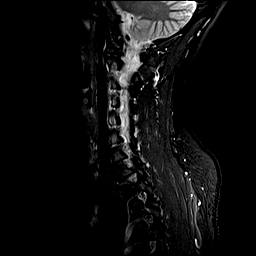
[im 5/13]
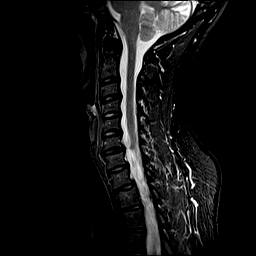
[im 7/13]
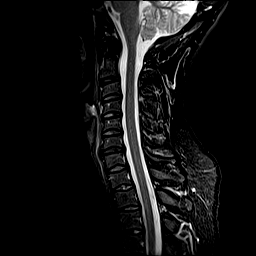
[im 9/13]
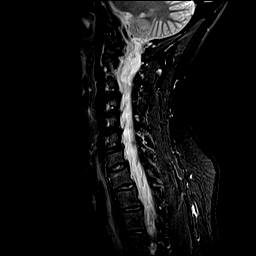
[im 11/13]
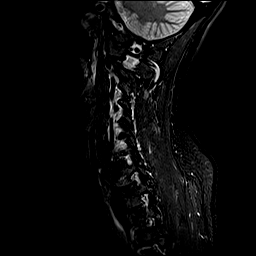
[im 13/13]
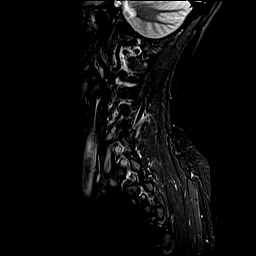

[Series 4: T1 · sagittal · 3.0mm · 0.82mm/px · 7 of 13 slices shown]
[im 1/13]
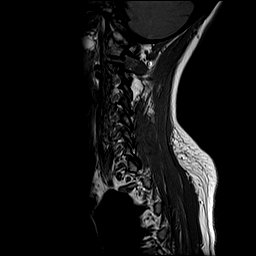
[im 3/13]
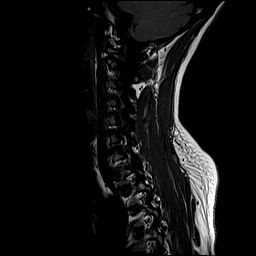
[im 5/13]
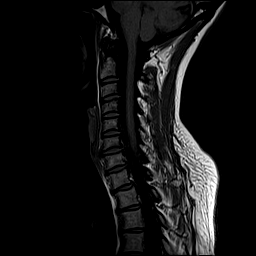
[im 7/13]
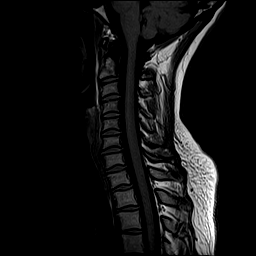
[im 9/13]
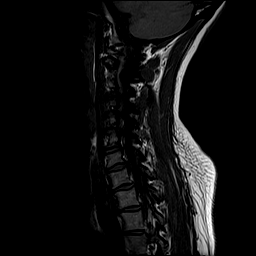
[im 11/13]
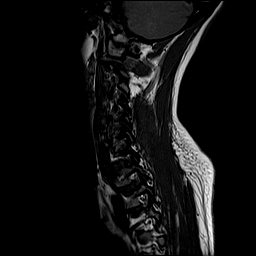
[im 13/13]
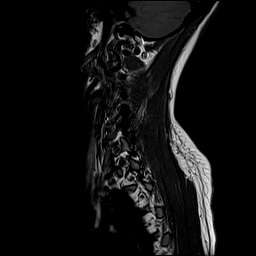

[Series 5: T2 · axial · 3.0mm · 0.70mm/px · z∈[-11,+74]mm · 9 of 24 slices shown (2 of 2)]
[im 1/24]
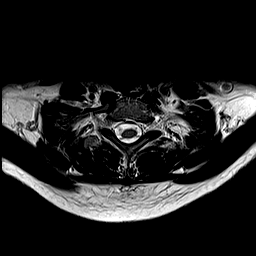
[im 4/24]
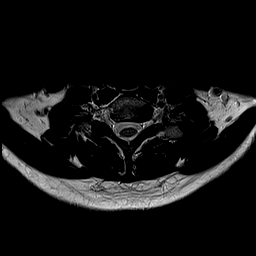
[im 8/24]
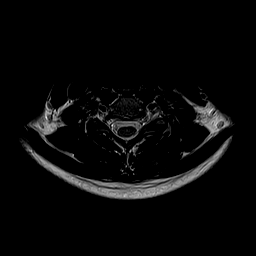
[im 10/24]
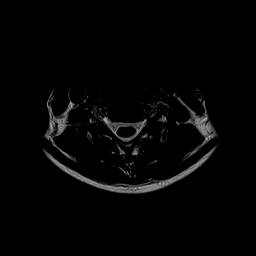
[im 12/24]
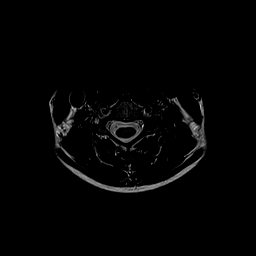
[im 14/24]
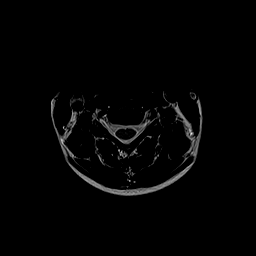
[im 16/24]
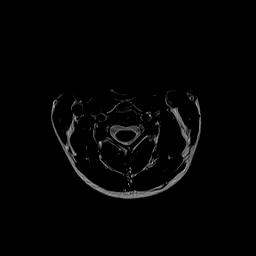
[im 20/24]
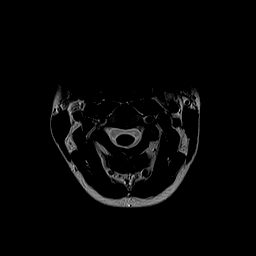
[im 24/24]
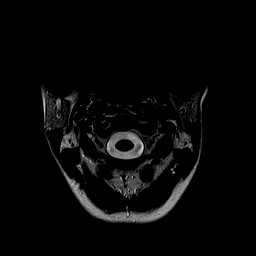

[Series 6: GRE · axial · 3.0mm · 0.35mm/px · z∈[-11,+22]mm · 4 of 24 slices shown]
[im 1/24]
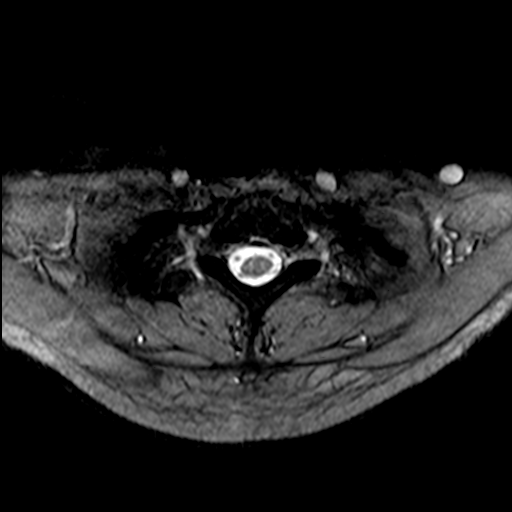
[im 4/24]
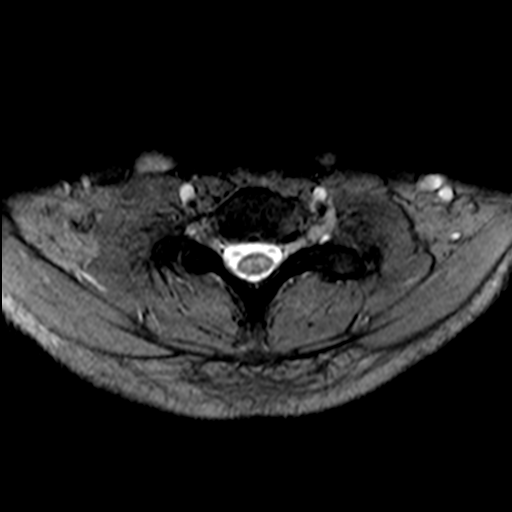
[im 8/24]
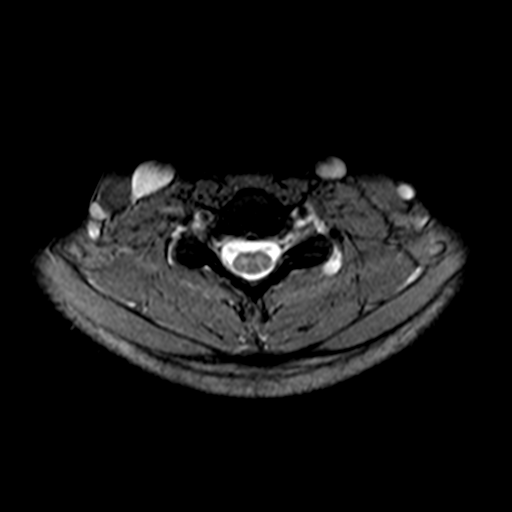
[im 10/24]
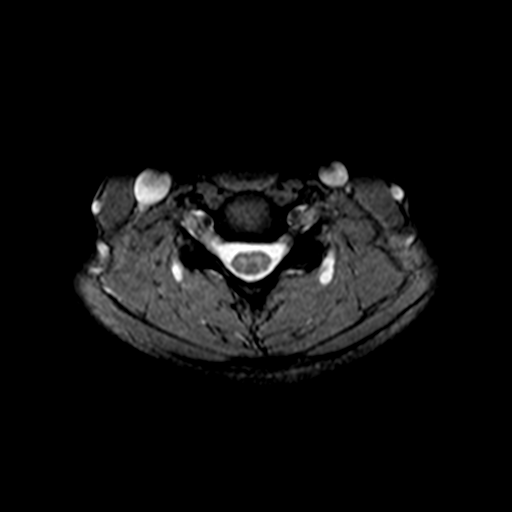

[35 of 48 positions shown; findings below may reference images not displayed]

FINDINGS: MRI CERVICAL SPINE FINDINGS

Alignment: Straightening of the cervical curvature.  She

Vertebrae: No fracture, evidence of discitis, or bone lesion.

Cord: Normal signal and morphology.

Posterior Fossa, vertebral arteries, paraspinal tissues: Negative.

Disc levels:

No significant disc herniation, spinal canal or neural foraminal
stenosis the cervical spine.

MRI THORACIC SPINE FINDINGS

Alignment: Minimal dextroconvex curvature of the upper thoracic
spine.

Vertebrae: No fracture, evidence of discitis, or bone lesion.

Cord:  Normal signal and morphology.

Paraspinal and other soft tissues: Negative.

Disc levels:

Small posterior disc protrusion at T2-3, T3-4 and T7-8 causing
minimal indentation on the thecal sac.

No significant spinal canal or neural foraminal stenosis at any
thoracic level.
IMPRESSION: 1. No significant spinal canal or neural foraminal stenosis at any
cervical or thoracic level.
2. No spinal cord signal abnormality.

## 2021-01-05 ENCOUNTER — Other Ambulatory Visit: Payer: Self-pay | Admitting: Internal Medicine

## 2021-01-05 ENCOUNTER — Encounter: Payer: Self-pay | Admitting: Internal Medicine

## 2021-01-05 DIAGNOSIS — M5414 Radiculopathy, thoracic region: Secondary | ICD-10-CM

## 2021-01-05 DIAGNOSIS — M5124 Other intervertebral disc displacement, thoracic region: Secondary | ICD-10-CM

## 2021-01-11 ENCOUNTER — Other Ambulatory Visit: Payer: Self-pay | Admitting: Internal Medicine

## 2021-01-11 ENCOUNTER — Encounter: Payer: Self-pay | Admitting: Physical Medicine & Rehabilitation

## 2021-01-11 DIAGNOSIS — R200 Anesthesia of skin: Secondary | ICD-10-CM

## 2021-01-11 DIAGNOSIS — R202 Paresthesia of skin: Secondary | ICD-10-CM

## 2021-01-18 ENCOUNTER — Encounter: Payer: Self-pay | Admitting: Physical Medicine & Rehabilitation

## 2021-01-18 ENCOUNTER — Other Ambulatory Visit: Payer: Self-pay

## 2021-01-18 ENCOUNTER — Encounter: Payer: 59 | Attending: Physical Medicine & Rehabilitation | Admitting: Physical Medicine & Rehabilitation

## 2021-01-18 VITALS — BP 107/72 | HR 61 | Temp 98.3°F | Ht 65.0 in | Wt 158.0 lb

## 2021-01-18 DIAGNOSIS — R202 Paresthesia of skin: Secondary | ICD-10-CM | POA: Insufficient documentation

## 2021-01-18 DIAGNOSIS — M5124 Other intervertebral disc displacement, thoracic region: Secondary | ICD-10-CM | POA: Diagnosis not present

## 2021-01-18 DIAGNOSIS — R200 Anesthesia of skin: Secondary | ICD-10-CM | POA: Insufficient documentation

## 2021-01-18 NOTE — Patient Instructions (Signed)
Resume shoulder exercises  Referral to [facility]  Wear splints at night and during the day during activities that worsen finger tingling symptoms  EMG/NCV next month

## 2021-01-18 NOTE — Progress Notes (Signed)
Subjective:    Patient ID: Brooke Sherman, female    DOB: 02/02/1986, 35 y.o.   MRN: [id number]  HPI CC:  Neck and Midback pain  35 year old female with primary complaint of lower neck and mid back pain.  The patient states that she had left ACL repair in 2019 and while she was using crutches developed left shoulder pain.  She had continued left shoulder pain even last year and underwent left shoulder MRI.  She saw physical medicine and rehab specialist in [place] who told her that she has subluxation of the shoulder.  She went through physical therapy and this was helpful.  She still has some occasional shoulder pain on the left side but has not kept up with her therapy exercises much.  The patient has been over the last year or so been complaining of neck pain.  This improved while she was pregnant.  She delivered in December 2021 The patient has no pain radiating into the lower extremities no lower extremity weakness or numbness no new bowel or bladder dysfunction. Finger numbness excluding pinky since March 2022.  The patient has hypothyroidism her thyroid meds were reduced in March.  She notices numbness and tingling during breast-feeding when she keeps her wrist in a flexed position Runs, does [place] as well as performs strength training. Pain Inventory Average Pain 4 Pain Right Now 3 My pain is constant, tingling and numbness  In the last 24 hours, has pain interfered with the following? General activity 2 Relation with others 0 Enjoyment of life 2 What TIME of day is your pain at its worst? morning  Sleep (in general) Good  Pain is worse with: twisting, turning. Pain improves with: heat/ice, therapy/exercise and medication Relief from Meds: 5  walk without assistance  employed # of hrs/week 10-20 hours per week [employer]  numbness tingling  Any changes since last visit?  yes CT/MRI [facility]  Any changes since last visit?  no New  Patient    Family History  Problem Relation Age of Onset  . Cervical cancer Mother    Social History   Socioeconomic History  . Marital status: Married    Spouse name: Not on file  . Number of children: Not on file  . Years of education: Not on file  . Highest education level: Not on file  Occupational History  . Not on file  Tobacco Use  . Smoking status: Never Smoker  . Smokeless tobacco: Never Used  Vaping Use  . Vaping Use: Never used  Substance and Sexual Activity  . Alcohol use: Yes    Comment: 1-2 times week  . Drug use: Not Currently  . Sexual activity: Not Currently    Partners: Male    Birth control/protection: None    Comment: wants IUD   Other Topics Concern  . Not on file  Social History Narrative  . Not on file   Social Determinants of Health   Financial Resource Strain: Not on file  Food Insecurity: Not on file  Transportation Needs: Not on file  Physical Activity: Not on file  Stress: Not on file  Social Connections: Not on file   Past Surgical History:  Procedure Laterality Date  . ANTERIOR CRUCIATE LIGAMENT REPAIR Left 2019  . WISDOM TOOTH EXTRACTION     Past Medical History:  Diagnosis Date  . Asthma   . Hypothyroid    BP 107/72   Pulse 61   Temp 98.3 F (36.8 C)  Ht 5\' 5"  (1.651 m)   Wt 158 lb (71.7 kg)   LMP  (LMP Unknown)   SpO2 98%   BMI 26.29 kg/m   Opioid Risk Score:   Fall Risk Score:  `1  Depression screen PHQ 2/9  Depression screen [facility] 2/9 01/18/2021 11/28/2020  Decreased Interest 0 0  Down, Depressed, Hopeless 0 0  PHQ - 2 Score 0 0  Altered sleeping 0 -  Tired, decreased energy 0 -  Change in appetite 0 -  Feeling bad or failure about yourself  0 -  Trouble concentrating 0 -  Moving slowly or fidgety/restless 0 -  Suicidal thoughts 0 -  PHQ-9 Score 0 -   Review of Systems  Musculoskeletal: Positive for back pain and neck pain. Negative for gait problem.       Left shoulder pain  Neurological: Positive  for numbness.       Tingling & numbness in finger tips  All other systems reviewed and are negative.      Objective:   Physical Exam Vitals and nursing note reviewed.  Constitutional:      Appearance: She is normal weight.  HENT:     Head: Normocephalic and atraumatic.  Eyes:     Extraocular Movements: Extraocular movements intact.     Conjunctiva/sclera: Conjunctivae normal.     Pupils: Pupils are equal, round, and reactive to light.  Cardiovascular:     Rate and Rhythm: Normal rate and regular rhythm.     Heart sounds: Normal heart sounds. No murmur heard.   Pulmonary:     Effort: Pulmonary effort is normal. No respiratory distress.     Breath sounds: Normal breath sounds.  Abdominal:     General: Abdomen is flat. There is no distension.     Palpations: Abdomen is soft.  Musculoskeletal:        General: No tenderness.     Cervical back: Normal range of motion and neck supple. No tenderness.     Right lower leg: No edema.     Left lower leg: No edema.     Comments: No tenderness over the cervical thoracic or lumbar paraspinals no tenderness over the spinous processes.  No tenderness in the periscapular area or in the gluteal or greater trochanters. Left shoulder negative impingement signs full range of motion Cervical spine range of motion is full she does have mild upper scapular pain when she turns her head to endrange on the right side. Wrist hands and fingers show no evidence of joint swelling Negative Tinel's bilateral wrists Negative reverse Phalen's bilateral wrists  Skin:    General: Skin is warm and dry.  Neurological:     General: No focal deficit present.     Mental Status: She is alert and oriented to person, place, and time.     Cranial Nerves: No dysarthria.     Sensory: Sensory deficit present.     Motor: Motor function is intact.     Coordination: Coordination is intact.     Gait: Gait is intact.     Deep Tendon Reflexes:     Reflex Scores:       Tricep reflexes are 2+ on the right side and 2+ on the left side.      Bicep reflexes are 2+ on the right side and 2+ on the left side.      Brachioradialis reflexes are 2+ on the right side and 2+ on the left side.      Patellar reflexes  are 2+ on the right side and 2+ on the left side.      Achilles reflexes are 2+ on the right side and 2+ on the left side.    Comments: Reduced sensation to pinprick in the index and middle fingers bilaterally intact at the fifth digit bilaterally  Motor strength is 5/5 bilateral deltoid, bicep, tricep, grip, hip flexion, knee extension, ankle dorsiflexion Negative straight leg raising bilaterally Tone is normal bilateral upper and lower limbs  Psychiatric:        Mood and Affect: Mood normal.        Behavior: Behavior normal.    Cervical thoracic and lumbar spine range of motion are normal and pain-free with exception of mild right-sided thoracic paraspinal pain when twisting toward the right side at around T7       Assessment & Plan:  #1.  Lower cervical upper and mid thoracic pain.  We reviewed MRI of the cervical and thoracic spine.  Cervical spine was normal.  Thoracic spine showed tiny disc protrusions of questionable clinical significance.  No evidence of spinal stenosis.  No signs of myofascial trigger points.  As discussed with the patient may have had postural changes related to her pregnancy as well as activity related muscle imbalances.  We will make referral to physical therapy for cervical thoracic stabilization. 2.  Bilateral hand numbness sparing little fingers appears to be in median nerve distribution agree with wrist splints at night, if not better after 1 month of wrist splint wear would recommend EMG/NCV.  This does not appear to be related to cervical spine given the normal C-spine MRI

## 2021-01-31 DIAGNOSIS — M5124 Other intervertebral disc displacement, thoracic region: Secondary | ICD-10-CM | POA: Diagnosis not present

## 2021-01-31 DIAGNOSIS — M545 Low back pain, unspecified: Secondary | ICD-10-CM | POA: Diagnosis not present

## 2021-01-31 DIAGNOSIS — M25512 Pain in left shoulder: Secondary | ICD-10-CM | POA: Diagnosis not present

## 2021-01-31 DIAGNOSIS — M542 Cervicalgia: Secondary | ICD-10-CM | POA: Diagnosis not present

## 2021-02-07 DIAGNOSIS — M542 Cervicalgia: Secondary | ICD-10-CM | POA: Diagnosis not present

## 2021-02-07 DIAGNOSIS — M5124 Other intervertebral disc displacement, thoracic region: Secondary | ICD-10-CM | POA: Diagnosis not present

## 2021-02-07 DIAGNOSIS — M545 Low back pain, unspecified: Secondary | ICD-10-CM | POA: Diagnosis not present

## 2021-02-07 DIAGNOSIS — M25512 Pain in left shoulder: Secondary | ICD-10-CM | POA: Diagnosis not present

## 2021-02-09 ENCOUNTER — Ambulatory Visit: Payer: 59 | Admitting: Physical Medicine & Rehabilitation

## 2021-02-15 ENCOUNTER — Encounter: Payer: Self-pay | Admitting: Physical Medicine & Rehabilitation

## 2021-02-15 ENCOUNTER — Other Ambulatory Visit: Payer: Self-pay

## 2021-02-15 ENCOUNTER — Encounter: Payer: 59 | Attending: Physical Medicine & Rehabilitation | Admitting: Physical Medicine & Rehabilitation

## 2021-02-15 VITALS — BP 127/75 | HR 56 | Temp 98.8°F | Ht 65.0 in | Wt 158.0 lb

## 2021-02-15 DIAGNOSIS — R200 Anesthesia of skin: Secondary | ICD-10-CM | POA: Diagnosis not present

## 2021-02-15 DIAGNOSIS — R202 Paresthesia of skin: Secondary | ICD-10-CM | POA: Diagnosis not present

## 2021-02-15 NOTE — Patient Instructions (Signed)
Please call if you'd like to schedule Left carpal tunnel injection

## 2021-02-15 NOTE — Progress Notes (Signed)
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  Recommend that patient continues to wear her splints.  She can call if she would like to get carpal tunnel injections.

## 2021-02-16 DIAGNOSIS — M542 Cervicalgia: Secondary | ICD-10-CM | POA: Diagnosis not present

## 2021-02-16 DIAGNOSIS — M5124 Other intervertebral disc displacement, thoracic region: Secondary | ICD-10-CM | POA: Diagnosis not present

## 2021-02-16 DIAGNOSIS — M25512 Pain in left shoulder: Secondary | ICD-10-CM | POA: Diagnosis not present

## 2021-02-16 DIAGNOSIS — M545 Low back pain, unspecified: Secondary | ICD-10-CM | POA: Diagnosis not present

## 2021-03-02 ENCOUNTER — Other Ambulatory Visit: Payer: Self-pay | Admitting: Internal Medicine

## 2021-03-02 DIAGNOSIS — E039 Hypothyroidism, unspecified: Secondary | ICD-10-CM

## 2021-03-02 DIAGNOSIS — M542 Cervicalgia: Secondary | ICD-10-CM | POA: Diagnosis not present

## 2021-03-02 DIAGNOSIS — M545 Low back pain, unspecified: Secondary | ICD-10-CM | POA: Diagnosis not present

## 2021-03-02 DIAGNOSIS — M25512 Pain in left shoulder: Secondary | ICD-10-CM | POA: Diagnosis not present

## 2021-03-02 DIAGNOSIS — M5124 Other intervertebral disc displacement, thoracic region: Secondary | ICD-10-CM | POA: Diagnosis not present

## 2021-03-05 ENCOUNTER — Other Ambulatory Visit: Payer: Self-pay | Admitting: Internal Medicine

## 2021-03-05 ENCOUNTER — Encounter: Payer: Self-pay | Admitting: Internal Medicine

## 2021-03-05 DIAGNOSIS — L918 Other hypertrophic disorders of the skin: Secondary | ICD-10-CM | POA: Diagnosis not present

## 2021-03-05 DIAGNOSIS — E039 Hypothyroidism, unspecified: Secondary | ICD-10-CM

## 2021-03-05 DIAGNOSIS — L603 Nail dystrophy: Secondary | ICD-10-CM | POA: Diagnosis not present

## 2021-03-09 ENCOUNTER — Other Ambulatory Visit: Payer: Self-pay

## 2021-03-09 ENCOUNTER — Other Ambulatory Visit (INDEPENDENT_AMBULATORY_CARE_PROVIDER_SITE_OTHER): Payer: 59

## 2021-03-09 ENCOUNTER — Other Ambulatory Visit: Payer: Self-pay | Admitting: Internal Medicine

## 2021-03-09 DIAGNOSIS — E039 Hypothyroidism, unspecified: Secondary | ICD-10-CM | POA: Diagnosis not present

## 2021-03-09 LAB — TSH: TSH: 0.17 u[IU]/mL — ABNORMAL LOW (ref 0.35–4.50)

## 2021-03-09 MED ORDER — LEVOTHYROXINE SODIUM 50 MCG PO TABS: 50.0000 ug | ORAL_TABLET | ORAL | 0 refills | Status: DC

## 2021-03-09 MED ORDER — LEVOTHYROXINE SODIUM 25 MCG PO TABS: 25.0000 ug | ORAL_TABLET | ORAL | 0 refills | Status: DC

## 2021-03-16 DIAGNOSIS — M25512 Pain in left shoulder: Secondary | ICD-10-CM | POA: Diagnosis not present

## 2021-03-16 DIAGNOSIS — M542 Cervicalgia: Secondary | ICD-10-CM | POA: Diagnosis not present

## 2021-03-16 DIAGNOSIS — M545 Low back pain, unspecified: Secondary | ICD-10-CM | POA: Diagnosis not present

## 2021-03-16 DIAGNOSIS — M5124 Other intervertebral disc displacement, thoracic region: Secondary | ICD-10-CM | POA: Diagnosis not present

## 2021-04-03 ENCOUNTER — Other Ambulatory Visit: Payer: Self-pay | Admitting: Internal Medicine

## 2021-04-03 ENCOUNTER — Encounter: Payer: Self-pay | Admitting: Internal Medicine

## 2021-04-03 DIAGNOSIS — K529 Noninfective gastroenteritis and colitis, unspecified: Secondary | ICD-10-CM | POA: Insufficient documentation

## 2021-04-03 MED ORDER — ONDANSETRON 4 MG PO TBDP: 4.0000 mg | ORAL_TABLET | Freq: Three times a day (TID) | ORAL | 1 refills | Status: DC | PRN

## 2021-04-09 DIAGNOSIS — M542 Cervicalgia: Secondary | ICD-10-CM | POA: Diagnosis not present

## 2021-04-09 DIAGNOSIS — M25512 Pain in left shoulder: Secondary | ICD-10-CM | POA: Diagnosis not present

## 2021-04-09 DIAGNOSIS — M545 Low back pain, unspecified: Secondary | ICD-10-CM | POA: Diagnosis not present

## 2021-04-09 DIAGNOSIS — M5124 Other intervertebral disc displacement, thoracic region: Secondary | ICD-10-CM | POA: Diagnosis not present

## 2021-04-19 ENCOUNTER — Ambulatory Visit: Payer: 59 | Admitting: Physical Medicine & Rehabilitation

## 2021-04-23 DIAGNOSIS — F419 Anxiety disorder, unspecified: Secondary | ICD-10-CM | POA: Diagnosis not present

## 2021-04-25 DIAGNOSIS — M25512 Pain in left shoulder: Secondary | ICD-10-CM | POA: Diagnosis not present

## 2021-04-25 DIAGNOSIS — M5124 Other intervertebral disc displacement, thoracic region: Secondary | ICD-10-CM | POA: Diagnosis not present

## 2021-04-25 DIAGNOSIS — M545 Low back pain, unspecified: Secondary | ICD-10-CM | POA: Diagnosis not present

## 2021-04-25 DIAGNOSIS — M542 Cervicalgia: Secondary | ICD-10-CM | POA: Diagnosis not present

## 2021-04-27 ENCOUNTER — Encounter: Payer: 59 | Attending: Physical Medicine & Rehabilitation | Admitting: Physical Medicine & Rehabilitation

## 2021-04-27 ENCOUNTER — Encounter: Payer: Self-pay | Admitting: Physical Medicine & Rehabilitation

## 2021-04-27 ENCOUNTER — Other Ambulatory Visit: Payer: Self-pay

## 2021-04-27 VITALS — BP 116/76 | HR 58 | Temp 98.3°F | Ht 65.0 in | Wt 148.8 lb

## 2021-04-27 DIAGNOSIS — G5602 Carpal tunnel syndrome, left upper limb: Secondary | ICD-10-CM | POA: Insufficient documentation

## 2021-04-27 NOTE — Progress Notes (Signed)
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 Symptoms are not relieved by conservative care.  Informed consent was obtained after describing risks and benefits of the procedure with the patient. These include bleeding bruising and infection as well as nerve injury. Patient elected to proceed and has given written consent. The distal wrist crease was marked and prepped with Betadine. A 30-gauge 1/2 inch needle was inserted and 0.25 ML's of 1% lidocaine injected into the skin and subcutaneous tissue. Then a 27-gauge 1/2 inch needle was inserted into the carpal tunnel and 0.25 mL of depomedrol 40 mg per mL was injected. Patient tolerated procedure well. Post procedure instructions given.

## 2021-05-02 ENCOUNTER — Other Ambulatory Visit: Payer: Self-pay | Admitting: Internal Medicine

## 2021-05-02 DIAGNOSIS — E039 Hypothyroidism, unspecified: Secondary | ICD-10-CM

## 2021-05-03 DIAGNOSIS — F419 Anxiety disorder, unspecified: Secondary | ICD-10-CM | POA: Diagnosis not present

## 2021-05-15 ENCOUNTER — Other Ambulatory Visit (HOSPITAL_COMMUNITY): Payer: Self-pay

## 2021-05-16 ENCOUNTER — Other Ambulatory Visit (HOSPITAL_COMMUNITY): Payer: Self-pay

## 2021-05-16 DIAGNOSIS — F419 Anxiety disorder, unspecified: Secondary | ICD-10-CM | POA: Diagnosis not present

## 2021-05-16 MED FILL — Levothyroxine Sodium Tab 25 MCG: ORAL | 30 days supply | Qty: 15 | Fill #0 | Status: AC

## 2021-05-16 MED FILL — Levothyroxine Sodium Tab 50 MCG: ORAL | 30 days supply | Qty: 15 | Fill #0 | Status: AC

## 2021-05-17 ENCOUNTER — Other Ambulatory Visit (HOSPITAL_COMMUNITY): Payer: Self-pay

## 2021-05-23 DIAGNOSIS — M542 Cervicalgia: Secondary | ICD-10-CM | POA: Diagnosis not present

## 2021-05-23 DIAGNOSIS — M25512 Pain in left shoulder: Secondary | ICD-10-CM | POA: Diagnosis not present

## 2021-05-23 DIAGNOSIS — M545 Low back pain, unspecified: Secondary | ICD-10-CM | POA: Diagnosis not present

## 2021-05-23 DIAGNOSIS — M5124 Other intervertebral disc displacement, thoracic region: Secondary | ICD-10-CM | POA: Diagnosis not present

## 2021-05-29 DIAGNOSIS — F419 Anxiety disorder, unspecified: Secondary | ICD-10-CM | POA: Diagnosis not present

## 2021-06-07 NOTE — Progress Notes (Signed)
[facility] Return Visit  SUBJECTIVE  History of Present Illness: Brooke Sherman is a 35 y.o. female seen in follow-up for vaginal pain and dyspareunia. Last seen in march 2022- at that time had granulation tissue cauterized- was still healing from vaginal delivery in Dec 2021.   Continues to have discomfort at the opening of the vagina. Feels a pulling like sensation. Intercourse is painful on penetration.    Past Medical History: Patient  has a past medical history of Asthma and Hypothyroid.   Past Surgical History: She  has a past surgical history that includes Anterior cruciate ligament repair (Left, 2019) and Wisdom tooth extraction.   Medications: She has a current medication list which includes the following prescription(s): [START ON 06/11/2021] estradiol, levothyroxine, levothyroxine, and prenatal vit-fe fumarate-fa.   Allergies: Patient has No Known Allergies.   Social History: Patient  reports that she has never smoked. She has never used smokeless tobacco. She reports current alcohol use. She reports that she does not currently use drugs.      OBJECTIVE     Physical Exam: Vitals:   06/08/21 1415  BP: 120/76  Pulse: 68  Weight: 145 lb (65.8 kg)  Height: 5\' 5"  (1.651 m)   Gen: No apparent distress, A&O x 3.  Detailed Urogynecologic Evaluation:  Normal labia majora. Abrasion present at the junction of the labia minora and introitus on the left. Digital palpation elucidates pain. Digital vaginal exam shows some tenderness near introitus on left but otherwise no levator tenderness.   Speculum exam reveals normal vaginal mucosa, normal appearing cervix with IUD strings present.     ASSESSMENT AND PLAN    Brooke Sherman is a 35 y.o. with:  1. Vaginal dryness   2. Dyspareunia in female    - Likely due to decreased estrogen due to breastfeeding. Prescribed estrace cream 0.5g nightly for two weeks then twice a week after.  - Also discussed use  of dilators to help with penetration.   Return 1 month or sooner if needed.   20, MD  Time spent: I spent 20 minutes dedicated to the care of this patient on the date of this encounter to include pre-visit review of records, face-to-face time with the patient and post visit documentation.

## 2021-06-08 ENCOUNTER — Encounter: Payer: Self-pay | Admitting: Obstetrics and Gynecology

## 2021-06-08 ENCOUNTER — Other Ambulatory Visit (HOSPITAL_COMMUNITY): Payer: Self-pay

## 2021-06-08 ENCOUNTER — Ambulatory Visit (INDEPENDENT_AMBULATORY_CARE_PROVIDER_SITE_OTHER): Payer: 59 | Admitting: Obstetrics and Gynecology

## 2021-06-08 ENCOUNTER — Other Ambulatory Visit: Payer: Self-pay

## 2021-06-08 VITALS — BP 120/76 | HR 68 | Ht 65.0 in | Wt 145.0 lb

## 2021-06-08 DIAGNOSIS — N941 Unspecified dyspareunia: Secondary | ICD-10-CM | POA: Diagnosis not present

## 2021-06-08 DIAGNOSIS — N898 Other specified noninflammatory disorders of vagina: Secondary | ICD-10-CM

## 2021-06-08 MED ORDER — ESTRADIOL 0.1 MG/GM VA CREA
0.5000 g | TOPICAL_CREAM | VAGINAL | 11 refills | Status: DC
  Filled 2021-06-08: qty 42.5, 90d supply, fill #0
  Filled 2021-08-30: qty 42.5, 90d supply, fill #1

## 2021-06-08 NOTE — Patient Instructions (Signed)
Start vaginal estrogen therapy nightly for two weeks then 2 times weekly at night for treatment of vaginal atrophy (dryness of the vaginal tissues).    Vulvovaginal moisturizer Options: Vitamin E oil (pump or capsule) or cream (Gene's Vit E Cream) Coconut oil V-magic  Silicone-based lubricant for use during intercourse ("wet platinum" is a brand available at most drugstores) Crisco Consider the ingredients of the product - the fewer the ingredients the better!  Directions for Use: Clean and dry your hands Gently dab the vulvar/vaginal area dry as needed Apply a "pea-sized" amount of the moisturizer onto your fingertip Using you other hand, open the labia  Apply the moisturizer to the vulvar/vaginal tissues Wear loose fitting underwear/clothing if possible following application Use moisturize up to 3 times daily as desired.

## 2021-06-20 DIAGNOSIS — M25512 Pain in left shoulder: Secondary | ICD-10-CM | POA: Diagnosis not present

## 2021-06-20 DIAGNOSIS — M542 Cervicalgia: Secondary | ICD-10-CM | POA: Diagnosis not present

## 2021-06-20 DIAGNOSIS — M545 Low back pain, unspecified: Secondary | ICD-10-CM | POA: Diagnosis not present

## 2021-06-20 DIAGNOSIS — M5124 Other intervertebral disc displacement, thoracic region: Secondary | ICD-10-CM | POA: Diagnosis not present

## 2021-06-25 DIAGNOSIS — F419 Anxiety disorder, unspecified: Secondary | ICD-10-CM | POA: Diagnosis not present

## 2021-06-26 ENCOUNTER — Other Ambulatory Visit (HOSPITAL_COMMUNITY): Payer: Self-pay

## 2021-06-26 ENCOUNTER — Telehealth: Payer: Self-pay

## 2021-06-26 MED FILL — Levothyroxine Sodium Tab 25 MCG: ORAL | 30 days supply | Qty: 15 | Fill #1 | Status: AC

## 2021-06-26 MED FILL — Levothyroxine Sodium Tab 50 MCG: ORAL | 30 days supply | Qty: 15 | Fill #1 | Status: AC

## 2021-06-26 NOTE — Telephone Encounter (Signed)
Brooke Sherman would like a sooner appointment  for the left & right Carpal Tunnel treatments. Her appointment is on 07/19/2021. However you do not have any sooner dates. She wanted to know if you can give her a  referral to a hand surgeon? Or see her sooner?  She is wears the braces and is still having numbness & pain in both hands.   Call back phone [phone].

## 2021-06-27 NOTE — Telephone Encounter (Signed)
Message left  for call back with Dr. Wynn Banker reply.

## 2021-06-29 ENCOUNTER — Encounter: Payer: Self-pay | Admitting: Physical Medicine & Rehabilitation

## 2021-06-29 ENCOUNTER — Other Ambulatory Visit: Payer: Self-pay

## 2021-06-29 ENCOUNTER — Encounter: Payer: 59 | Attending: Physical Medicine & Rehabilitation | Admitting: Physical Medicine & Rehabilitation

## 2021-06-29 VITALS — BP 118/78 | HR 58 | Temp 98.2°F | Ht 65.0 in | Wt 153.8 lb

## 2021-06-29 DIAGNOSIS — G5601 Carpal tunnel syndrome, right upper limb: Secondary | ICD-10-CM | POA: Diagnosis not present

## 2021-06-29 NOTE — Progress Notes (Signed)
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 Symptoms are not relieved by conservative care.  Informed consent was obtained after describing risks and benefits of the procedure with the patient. These include bleeding bruising and infection as well as nerve injury. Patient elected to proceed and has given written consent. The right distal wrist crease was marked and prepped with Betadine. A 30-gauge 1/2 inch needle was inserted and 0.25 ML's of 1% lidocaine injected into the skin and subcutaneous tissue. Then a 27-gauge 1/2 inch needle was inserted into the carpal tunnel and 0.25 mL of celestone 6mg  per mL was injected.   Patient tolerated procedure well. Post procedure instructions given.  Pt having increasing symptoms on left side , last injection 42mo ago, will need to wait another month prior to re injection

## 2021-07-12 ENCOUNTER — Ambulatory Visit: Payer: 59 | Admitting: Physical Medicine & Rehabilitation

## 2021-07-17 ENCOUNTER — Ambulatory Visit: Payer: 59 | Admitting: Obstetrics and Gynecology

## 2021-07-19 ENCOUNTER — Ambulatory Visit: Payer: 59 | Admitting: Physical Medicine & Rehabilitation

## 2021-07-19 DIAGNOSIS — M545 Low back pain, unspecified: Secondary | ICD-10-CM | POA: Diagnosis not present

## 2021-07-19 DIAGNOSIS — M5124 Other intervertebral disc displacement, thoracic region: Secondary | ICD-10-CM | POA: Diagnosis not present

## 2021-07-19 DIAGNOSIS — M25512 Pain in left shoulder: Secondary | ICD-10-CM | POA: Diagnosis not present

## 2021-07-19 DIAGNOSIS — M542 Cervicalgia: Secondary | ICD-10-CM | POA: Diagnosis not present

## 2021-07-31 ENCOUNTER — Other Ambulatory Visit (HOSPITAL_COMMUNITY): Payer: Self-pay

## 2021-07-31 MED FILL — Levothyroxine Sodium Tab 50 MCG: ORAL | 30 days supply | Qty: 15 | Fill #2 | Status: AC

## 2021-07-31 MED FILL — Levothyroxine Sodium Tab 25 MCG: ORAL | 30 days supply | Qty: 15 | Fill #2 | Status: AC

## 2021-08-01 DIAGNOSIS — M545 Low back pain, unspecified: Secondary | ICD-10-CM | POA: Diagnosis not present

## 2021-08-01 DIAGNOSIS — M25512 Pain in left shoulder: Secondary | ICD-10-CM | POA: Diagnosis not present

## 2021-08-01 DIAGNOSIS — M5124 Other intervertebral disc displacement, thoracic region: Secondary | ICD-10-CM | POA: Diagnosis not present

## 2021-08-01 DIAGNOSIS — M542 Cervicalgia: Secondary | ICD-10-CM | POA: Diagnosis not present

## 2021-08-15 DIAGNOSIS — M545 Low back pain, unspecified: Secondary | ICD-10-CM | POA: Diagnosis not present

## 2021-08-15 DIAGNOSIS — M25512 Pain in left shoulder: Secondary | ICD-10-CM | POA: Diagnosis not present

## 2021-08-15 DIAGNOSIS — M542 Cervicalgia: Secondary | ICD-10-CM | POA: Diagnosis not present

## 2021-08-15 DIAGNOSIS — M5124 Other intervertebral disc displacement, thoracic region: Secondary | ICD-10-CM | POA: Diagnosis not present

## 2021-08-28 ENCOUNTER — Encounter: Payer: 59 | Attending: Physical Medicine & Rehabilitation | Admitting: Physical Medicine & Rehabilitation

## 2021-08-28 ENCOUNTER — Other Ambulatory Visit: Payer: Self-pay

## 2021-08-28 ENCOUNTER — Encounter: Payer: Self-pay | Admitting: Physical Medicine & Rehabilitation

## 2021-08-28 VITALS — BP 120/76 | HR 50 | Ht 65.0 in | Wt 150.2 lb

## 2021-08-28 DIAGNOSIS — G5602 Carpal tunnel syndrome, left upper limb: Secondary | ICD-10-CM | POA: Diagnosis not present

## 2021-08-28 DIAGNOSIS — M545 Low back pain, unspecified: Secondary | ICD-10-CM | POA: Diagnosis not present

## 2021-08-28 DIAGNOSIS — M5124 Other intervertebral disc displacement, thoracic region: Secondary | ICD-10-CM | POA: Diagnosis not present

## 2021-08-28 DIAGNOSIS — M25512 Pain in left shoulder: Secondary | ICD-10-CM | POA: Diagnosis not present

## 2021-08-28 DIAGNOSIS — M542 Cervicalgia: Secondary | ICD-10-CM | POA: Diagnosis not present

## 2021-08-28 NOTE — Patient Instructions (Signed)
Preventing Carpal Tunnel Syndrome °Carpal tunnel syndrome is a condition that causes pain, numbness, and weakness in the wrist, hand, and fingers. The carpal tunnel is a narrow, rigid space in the wrist. Tendons and the median nerve pass through the carpal tunnel. The median nerve is the main nerve in the hand. The median nerve gives sensation or feeling to the thumb, the muscles at the base of the thumb, and the first three fingers. Carpal tunnel syndrome happens when the median nerve gets squeezed in the area where it passes through the carpal tunnel. °In some cases, it may not be possible to prevent carpal tunnel syndrome. However, you can take steps to relieve pressure on your wrist and reduce your risk of developing this condition. °How can carpal tunnel syndrome affect me? °Carpal tunnel syndrome can affect your ability to do jobs or activities that involve hand, wrist, and finger action. It can cause symptoms such as: °Pain in the wrist, hand, and fingers. °Burning, tingling, or numbness in the affected area. °A weak feeling in your hands. You may have trouble grabbing and holding items. °Symptoms may get worse over time. For some people, symptoms get worse at night. °What can increase my risk? °The following factors may make you more likely to develop this condition: °Having a job that requires you to repeatedly or forcefully bend or move your wrist, or a job that requires you to use tools that vibrate. This may include jobs that involve using computers, working on an assembly line, or working with power tools such as drills or sanders. °Being a woman. °Having a family history of the condition. °Having certain conditions, such as: °Diabetes. °Pregnancy. °Obesity. °Thyroid disease. °Rheumatoid arthritis. °What actions can I take to help prevent carpal tunnel syndrome? °  °Avoid making repetitive or forceful hand and wrist motions that cause your wrist to bend or get stiff or painful. °Take frequent breaks,  about every 30 minutes, if you use your hands and wrists for many hours at a time. °Avoid sitting for long stretches of time. Try to get up and move every 30 minutes. Stretch your hands and fingers often to increase blood flow and relieve tension. °Keep your wrists in the natural position when using a computer keyboard or mouse. Do not bend your wrists downward or sideways. Arms and shoulders should be relaxed with elbows at your sides. °If you use your hands and wrists for many hours at work, make changes to your work space to ease pressure on your wrists. You may want to use: °A padded wrist rest for computer work. Use this to lightly rest your wrist and hands when you are not actively keying. °A keyboard at a height in which your wrists are straight when typing. You may need to flatten the keyboard or even tilt it away from you. °Hand tools with padded handles or work gloves with padding to reduce vibrations. °Talk to your health provider about wearing a wrist brace or support. This will not prevent carpal tunnel syndrome but it may keep it from getting worse. A wrist brace may help reduce bending and stress. °Closely manage any medical conditions you have that can put you at risk for carpal tunnel syndrome. Have your blood sugar checked to make sure you are not developing diabetes. If you have diabetes, work with your health care provider to keep your blood sugar under control. °Physical activity and exercise may help with this condition. Some people find yoga or aerobic exercise helpful. °Where to   find more information °National Institute of Neurological Disorders and Stroke: ninds.nih.gov °American Academy of Family Physicians: familydoctor.org °Contact a health care provider if: °You have numbness or tingling in your wrist, hand, or fingers. °You have pain or a burning sensation in your wrist, hand, or fingers. °Pain, tingling, or burning wakes you up at night. °Your hand becomes weak and clumsy. °You  frequently drop objects. °You are unable to use your wrists and hands without pain. °Summary °Carpal tunnel syndrome is a condition that causes pain, numbness, and weakness in the wrist, hand, and fingers. °You can take steps to relieve pressure on your wrist and reduce your risk of developing this condition. °Avoid making repetitive hand and wrist motions that cause your wrist to get stiff or painful. °If you use your hands and wrists for many hours at work, you may want to make changes to your work space to ease pressure on your wrists. °Take frequent breaks to stretch your hands and fingers. °This information is not intended to replace advice given to you by your health care provider. Make sure you discuss any questions you have with your health care provider. °Document Revised: 03/02/2020 Document Reviewed: 01/27/2020 °Elsevier Patient Education © 2022 [employer] ° °

## 2021-08-28 NOTE — Progress Notes (Signed)
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 Symptoms are not relieved by conservative care.  Informed consent was obtained after describing risks and benefits of the procedure with the patient. These include bleeding bruising and infection as well as nerve injury. Patient elected to proceed and has given written consent. The distal wrist crease was marked and prepped with Betadine. A 30-gauge 1/2 inch needle was inserted and 0.25 ML's of 1% lidocaine injected into the skin and subcutaneous tissue. Then a 27-gauge 1/2 inch needle was inserted into the carpal tunnel and 0.25 mL of depomedrol 40 mg per mL was injected. Patient tolerated procedure well. Post procedure instructions given.

## 2021-08-30 ENCOUNTER — Encounter: Payer: Self-pay | Admitting: Internal Medicine

## 2021-08-30 ENCOUNTER — Other Ambulatory Visit: Payer: Self-pay | Admitting: Internal Medicine

## 2021-08-30 ENCOUNTER — Other Ambulatory Visit (HOSPITAL_COMMUNITY): Payer: Self-pay

## 2021-08-30 DIAGNOSIS — E039 Hypothyroidism, unspecified: Secondary | ICD-10-CM

## 2021-08-30 MED ORDER — LEVOTHYROXINE SODIUM 25 MCG PO TABS
25.0000 ug | ORAL_TABLET | ORAL | 0 refills | Status: DC
  Filled 2021-08-30: qty 15, 30d supply, fill #0

## 2021-08-30 MED ORDER — LEVOTHYROXINE SODIUM 50 MCG PO TABS
50.0000 ug | ORAL_TABLET | ORAL | 0 refills | Status: DC
  Filled 2021-08-30: qty 15, 30d supply, fill #0

## 2021-09-04 ENCOUNTER — Other Ambulatory Visit (HOSPITAL_COMMUNITY): Payer: Self-pay

## 2021-09-05 DIAGNOSIS — F419 Anxiety disorder, unspecified: Secondary | ICD-10-CM | POA: Diagnosis not present

## 2021-09-12 ENCOUNTER — Ambulatory Visit: Payer: 59 | Admitting: Internal Medicine

## 2021-09-13 ENCOUNTER — Other Ambulatory Visit (HOSPITAL_COMMUNITY): Payer: Self-pay

## 2021-09-13 MED ORDER — FLUCONAZOLE 150 MG PO TABS
ORAL_TABLET | ORAL | 0 refills | Status: DC
  Filled 2021-09-13: qty 1, 1d supply, fill #0

## 2021-09-13 MED ORDER — AMOXICILLIN-POT CLAVULANATE 875-125 MG PO TABS
ORAL_TABLET | ORAL | 0 refills | Status: DC
  Filled 2021-09-13: qty 14, 7d supply, fill #0

## 2021-09-13 MED ORDER — NYSTATIN 500000 UNITS PO TABS
ORAL_TABLET | ORAL | 0 refills | Status: DC
  Filled 2021-09-13: qty 6, 2d supply, fill #0

## 2021-09-18 DIAGNOSIS — F419 Anxiety disorder, unspecified: Secondary | ICD-10-CM | POA: Diagnosis not present

## 2021-10-05 ENCOUNTER — Encounter: Payer: Self-pay | Admitting: Physical Medicine & Rehabilitation

## 2021-10-16 ENCOUNTER — Other Ambulatory Visit (HOSPITAL_COMMUNITY): Payer: Self-pay

## 2021-10-16 ENCOUNTER — Ambulatory Visit: Payer: 59 | Admitting: Internal Medicine

## 2021-10-16 ENCOUNTER — Other Ambulatory Visit: Payer: Self-pay

## 2021-10-16 VITALS — BP 106/68 | HR 50 | Temp 98.1°F | Resp 16 | Ht 65.0 in | Wt 150.0 lb

## 2021-10-16 DIAGNOSIS — E039 Hypothyroidism, unspecified: Secondary | ICD-10-CM | POA: Diagnosis not present

## 2021-10-16 DIAGNOSIS — R001 Bradycardia, unspecified: Secondary | ICD-10-CM

## 2021-10-16 DIAGNOSIS — Z975 Presence of (intrauterine) contraceptive device: Secondary | ICD-10-CM

## 2021-10-16 LAB — TSH: TSH: 4.19 u[IU]/mL (ref 0.35–5.50)

## 2021-10-16 MED ORDER — LEVOTHYROXINE SODIUM 50 MCG PO TABS
50.0000 ug | ORAL_TABLET | ORAL | 1 refills | Status: DC
  Filled 2021-10-16: qty 45, 90d supply, fill #0
  Filled 2022-01-28: qty 45, 90d supply, fill #1

## 2021-10-16 MED ORDER — LEVOTHYROXINE SODIUM 25 MCG PO TABS
25.0000 ug | ORAL_TABLET | ORAL | 1 refills | Status: DC
  Filled 2021-10-16: qty 45, 90d supply, fill #0
  Filled 2022-01-28: qty 45, 90d supply, fill #1

## 2021-10-16 NOTE — Progress Notes (Signed)
Subjective:  Patient ID: Brooke Sherman, female    DOB: 04/18/1986  Age: 36 y.o. MRN: [id number]  CC: Hypothyroidism  This visit occurred during the SARS-CoV-2 public health emergency.  Safety protocols were in place, including screening questions prior to the visit, additional usage of staff PPE, and extensive cleaning of exam room while observing appropriate contact time as indicated for disinfecting solutions.    HPI Brooke Sherman presents for f/up -  She tells me that she has had bradycardia for several years.  She is very active and denies dizziness, lightheadedness, near-syncope, or syncope.  She complains of fatigue but offers no other complaints.  Outpatient Medications Prior to Visit  Medication Sig Dispense Refill   levonorgestrel (MIRENA) 20 MCG/DAY IUD 1 each by Intrauterine route once for 1 dose. 1 each 0   Prenatal Vit-Fe Fumarate-FA (PRENATAL VITAMINS PO) Take by mouth.     levothyroxine (SYNTHROID) 25 MCG tablet TAKE 1 TABLET (25 MCG TOTAL) BY MOUTH EVERY OTHER DAY. 15 tablet 0   levothyroxine (SYNTHROID) 50 MCG tablet TAKE 1 TABLET (50 MCG TOTAL) BY MOUTH EVERY OTHER DAY. 15 tablet 0   amoxicillin-clavulanate (AUGMENTIN) 875-125 MG tablet Take 1 tablet by mouth 2 times daily until gone 14 tablet 0   estradiol (ESTRACE) 0.1 MG/GM vaginal cream Place 0.5 grams nightly for two weeks then twice weekly thereafter. 30 g 11   fluconazole (DIFLUCAN) 150 MG tablet Take 1 tablet (150 mg) by mouth once for 1 dose 1 tablet 0   No facility-administered medications prior to visit.    ROS Review of Systems  Constitutional:  Positive for fatigue. Negative for appetite change, diaphoresis and unexpected weight change.  HENT: Negative.    Eyes: Negative.   Respiratory:  Negative for cough and shortness of breath.   Cardiovascular:  Negative for chest pain, palpitations and leg swelling.  Gastrointestinal:  Negative for abdominal pain, constipation and  diarrhea.  Endocrine: Negative for cold intolerance and heat intolerance.  Genitourinary: Negative.  Negative for difficulty urinating.  Musculoskeletal: Negative.   Skin: Negative.   Neurological:  Negative for dizziness, weakness, light-headedness and numbness.  Hematological:  Negative for adenopathy. Does not bruise/bleed easily.  Psychiatric/Behavioral: Negative.     Objective:  BP 106/68 (BP Location: Right Arm, Patient Position: Sitting, Cuff Size: Large)    Pulse (!) 50    Temp 98.1 F (36.7 C) (Oral)    Resp 16    Ht 5\' 5"  (1.651 m)    Wt 150 lb (68 kg)    SpO2 98%    BMI 24.96 kg/m   BP Readings from Last 3 Encounters:  10/16/21 106/68  08/28/21 120/76  06/29/21 118/78    Wt Readings from Last 3 Encounters:  10/16/21 150 lb (68 kg)  08/28/21 150 lb 3.2 oz (68.1 kg)  06/29/21 153 lb 12.8 oz (69.8 kg)    Physical Exam Vitals reviewed.  Constitutional:      Appearance: Normal appearance.  HENT:     Nose: Nose normal.     Mouth/Throat:     Mouth: Mucous membranes are moist.  Eyes:     General: No scleral icterus.    Conjunctiva/sclera: Conjunctivae normal.  Cardiovascular:     Rate and Rhythm: Regular rhythm. Bradycardia present.     Pulses: Normal pulses.     Heart sounds: No murmur heard. Pulmonary:     Effort: Pulmonary effort is normal.     Breath sounds: No stridor. No wheezing, rhonchi or  rales.  Abdominal:     General: Abdomen is flat.     Palpations: There is no mass.     Tenderness: There is no abdominal tenderness. There is no guarding.     Hernia: No hernia is present.  Musculoskeletal:     Cervical back: Normal range of motion. No rigidity.  Skin:    General: Skin is warm and dry.     Findings: No lesion.  Neurological:     General: No focal deficit present.     Mental Status: She is alert.  Psychiatric:        Mood and Affect: Mood normal.        Behavior: Behavior normal.    Lab Results  Component Value Date   WBC 4.5 11/28/2020    HGB 14.0 11/28/2020   HCT 41.7 11/28/2020   PLT 237.0 11/28/2020   CHOL 195 11/28/2020   TRIG 100.0 11/28/2020   HDL 60.30 11/28/2020   LDLCALC 115 (H) 11/28/2020   TSH 4.19 10/16/2021    MR CERVICAL SPINE WO CONTRAST  Result Date: 01/04/2021 CLINICAL DATA:  Radiculitis of right cervical region. Numbness and tingling of both hands. Disc disease, degenerative, cervical. Thoracic radiculitis. EXAM: MRI CERVICAL AND THORACIC SPINE WITHOUT CONTRAST TECHNIQUE: Multiplanar and multiecho pulse sequences of the cervical spine, to include the craniocervical junction and cervicothoracic junction, and the thoracic spine, were obtained without intravenous contrast. COMPARISON:  None. FINDINGS: MRI CERVICAL SPINE FINDINGS Alignment: Straightening of the cervical curvature.  She Vertebrae: No fracture, evidence of discitis, or bone lesion. Cord: Normal signal and morphology. Posterior Fossa, vertebral arteries, paraspinal tissues: Negative. Disc levels: No significant disc herniation, spinal canal or neural foraminal stenosis the cervical spine. MRI THORACIC SPINE FINDINGS Alignment: Minimal dextroconvex curvature of the upper thoracic spine. Vertebrae: No fracture, evidence of discitis, or bone lesion. Cord:  Normal signal and morphology. Paraspinal and other soft tissues: Negative. Disc levels: Small posterior disc protrusion at T2-3, T3-4 and T7-8 causing minimal indentation on the thecal sac. No significant spinal canal or neural foraminal stenosis at any thoracic level. IMPRESSION: 1. No significant spinal canal or neural foraminal stenosis at any cervical or thoracic level. 2. No spinal cord signal abnormality. Electronically Signed   By: Baldemar Lenis M.D.   On: 01/04/2021 16:04   MR Thoracic Spine Wo Contrast  Result Date: 01/04/2021 CLINICAL DATA:  Radiculitis of right cervical region. Numbness and tingling of both hands. Disc disease, degenerative, cervical. Thoracic radiculitis. EXAM: MRI  CERVICAL AND THORACIC SPINE WITHOUT CONTRAST TECHNIQUE: Multiplanar and multiecho pulse sequences of the cervical spine, to include the craniocervical junction and cervicothoracic junction, and the thoracic spine, were obtained without intravenous contrast. COMPARISON:  None. FINDINGS: MRI CERVICAL SPINE FINDINGS Alignment: Straightening of the cervical curvature.  She Vertebrae: No fracture, evidence of discitis, or bone lesion. Cord: Normal signal and morphology. Posterior Fossa, vertebral arteries, paraspinal tissues: Negative. Disc levels: No significant disc herniation, spinal canal or neural foraminal stenosis the cervical spine. MRI THORACIC SPINE FINDINGS Alignment: Minimal dextroconvex curvature of the upper thoracic spine. Vertebrae: No fracture, evidence of discitis, or bone lesion. Cord:  Normal signal and morphology. Paraspinal and other soft tissues: Negative. Disc levels: Small posterior disc protrusion at T2-3, T3-4 and T7-8 causing minimal indentation on the thecal sac. No significant spinal canal or neural foraminal stenosis at any thoracic level. IMPRESSION: 1. No significant spinal canal or neural foraminal stenosis at any cervical or thoracic level. 2. No spinal cord signal  abnormality. Electronically Signed   By: Baldemar LenisKatyucia  De Macedo Rodrigues M.D.   On: 01/04/2021 16:04    Assessment & Plan:   Sharyl NimrodMeredith was seen today for hypothyroidism.  Diagnoses and all orders for this visit:  Acquired hypothyroidism- Her TSH is in the normal range.  I recommended that she stay on the current T4 dosage. -     TSH; Future -     TSH -     levothyroxine (SYNTHROID) 25 MCG tablet; TAKE 1 TABLET (25 MCG TOTAL) BY MOUTH EVERY OTHER DAY. -     levothyroxine (SYNTHROID) 50 MCG tablet; TAKE 1 TABLET (50 MCG TOTAL) BY MOUTH EVERY OTHER DAY.  Contraception, device intrauterine  Sinus bradycardia, chronic- She is asymptomatic with this.  No intervention is needed.   I have discontinued Lasha C.  Wildrick's estradiol, amoxicillin-clavulanate, and fluconazole. I am also having her maintain her Prenatal Vit-Fe Fumarate-FA (PRENATAL VITAMINS PO), levonorgestrel, levothyroxine, and levothyroxine.  Meds ordered this encounter  Medications   levothyroxine (SYNTHROID) 25 MCG tablet    Sig: TAKE 1 TABLET (25 MCG TOTAL) BY MOUTH EVERY OTHER DAY.    Dispense:  45 tablet    Refill:  1   levothyroxine (SYNTHROID) 50 MCG tablet    Sig: TAKE 1 TABLET (50 MCG TOTAL) BY MOUTH EVERY OTHER DAY.    Dispense:  45 tablet    Refill:  1     Follow-up: No follow-ups on file.  Sanda Lingerhomas Riham Polyakov, MD

## 2021-10-17 ENCOUNTER — Encounter: Payer: Self-pay | Admitting: Internal Medicine

## 2021-10-17 DIAGNOSIS — F419 Anxiety disorder, unspecified: Secondary | ICD-10-CM | POA: Diagnosis not present

## 2021-10-17 DIAGNOSIS — Z975 Presence of (intrauterine) contraceptive device: Secondary | ICD-10-CM | POA: Insufficient documentation

## 2021-10-18 ENCOUNTER — Encounter: Payer: Self-pay | Admitting: Physical Medicine & Rehabilitation

## 2021-10-18 ENCOUNTER — Encounter: Payer: 59 | Attending: Physical Medicine & Rehabilitation | Admitting: Physical Medicine & Rehabilitation

## 2021-10-18 ENCOUNTER — Other Ambulatory Visit: Payer: Self-pay

## 2021-10-18 VITALS — BP 108/70 | HR 58 | Temp 98.5°F | Ht 65.0 in | Wt 149.0 lb

## 2021-10-18 DIAGNOSIS — G5601 Carpal tunnel syndrome, right upper limb: Secondary | ICD-10-CM | POA: Insufficient documentation

## 2021-10-18 NOTE — Patient Instructions (Signed)
Open Carpal Tunnel Release Open carpal tunnel release is a surgery to relieve symptoms caused by carpal tunnel syndrome. The carpal tunnel is a narrow, rigid space in the wrist. It is located between the wrist bones and a band of connective tissue (transverse carpal ligament). The nerve that supplies most of the hand (median nerve) passes through the carpal tunnel, and so do tissues that connect bones to muscles (tendons) in the hand and arm. Carpal tunnel syndrome makes this space swell and become narrow. The swelling pinches the median nerve and causes pain, numbness, tingling, and weakness in the hand. During carpal tunnel release surgery, the transverse carpal ligament is cut to make more room in the carpal tunnel space. This also lessens the pressure on the median nerve. You may have this surgery if other types of treatment have not relieved your carpal tunnel symptoms. This surgery is usually done only for the hand that you use more often (dominant hand), but it may be done for both hands depending on your symptoms. Tell a health care provider about: Any allergies you have. All medicines you are taking, including vitamins, herbs, eye drops, creams, and over-the-counter medicines. Any problems you or family members have had with anesthetic medicines. Any blood disorders you have. Any surgeries you have had. Any medical conditions you have. Whether you are pregnant or may be pregnant. What are the risks? Generally, this is a safe procedure. However, problems may occur, including: Infection. Bleeding. Injury to the median nerve. Allergic reactions to medicines. The surgery failing to relieve your symptoms, or making your symptoms worse. What happens before the procedure? Staying hydrated Follow instructions from your health care provider about hydration, which may include: Up to 2 hours before the procedure - you may continue to drink clear liquids, such as water, clear fruit juice, black  coffee, and plain tea.  Eating and drinking restrictions Follow instructions from your health care provider about eating and drinking, which may include: 8 hours before the procedure - stop eating heavy meals or foods such as meat, fried foods, or fatty foods. 6 hours before the procedure - stop eating light meals or foods, such as toast or cereal. 6 hours before the procedure - stop drinking milk or drinks that contain milk. 2 hours before the procedure - stop drinking clear liquids. Medicines Ask your health care provider about: Changing or stopping your regular medicines. This is especially important if you are taking diabetes medicines or blood thinners. Taking medicines such as aspirin and ibuprofen. These medicines can thin your blood. Do not take these medicines unless your health care provider tells you to take them. Taking over-the-counter medicines, vitamins, herbs, and supplements. General instructions Do not use any products that contain nicotine or tobacco for at least 4 weeks before the procedure. These products include cigarettes, e-cigarettes, and chewing tobacco. If you need help quitting, ask your health care provider. Plan to have a responsible adult take you home from the hospital or clinic. If you will be going home right after the procedure, plan to have a responsible adult care for you for the time you are told. This is important. Ask your health care provider: How your surgery site will be marked. What steps will be taken to help prevent infection. These steps may include washing the skin with a germ-killing soap. What happens during the procedure?  An IV will be inserted into one of your veins. You will be given one or more of the following: A medicine to  help you relax (sedative). A medicine to numb the wrist area (local anesthetic). A medicine that is injected to numb everything below the injection site (regional anesthetic). An incision will be made in your  wrist, on the same side as your palm. The skin of your wrist will be spread to expose the transverse carpal ligament. The transverse carpal ligament will be cut to make more room in the carpal tunnel space. Your incision will be closed with stitches (sutures) or staples. A compression bandage (dressing) will be wrapped around your hand and wrist. The procedure may vary among health care providers and hospitals. What happens after the procedure? Your blood pressure, heart rate, breathing rate, and blood oxygen level will be monitored until you leave the hospital or clinic. You will be given pain medicine as needed. A splint or brace may be placed over your dressing, to hold your hand and wrist in place while you heal. Do not drive until your health care provider approves. Summary Carpal tunnel release is a surgery to relieve pain and numbness in the hand caused by swelling around a nerve (carpal tunnel syndrome). You may have this surgery if other types of treatment have not relieved your carpal tunnel symptoms. During carpal tunnel release surgery, a band of connective tissue (transverse carpal ligament) is cut to make more room in the carpal tunnel space. This information is not intended to replace advice given to you by your health care provider. Make sure you discuss any questions you have with your health care provider. Document Revised: 02/01/2020 Document Reviewed: 01/20/2020 [employer] Patient Education  2022 [employer]. Endoscopic Carpal Tunnel Release Endoscopic carpal tunnel release is a surgery to relieve hand pain, numbness, and other symptoms caused by carpal tunnel syndrome. The carpal tunnel is a narrow, rigid space in the wrist. It is located between the wrist bones and a strong band of tissue (transverse carpal ligament). The nerve that provides feeling to most of the hand (median nerve) passes through the carpal tunnel. In carpal tunnel syndrome, swelling within the tunnel  causes the median nerve to be pinched, causing symptoms. You may need endoscopic carpal tunnel release if you have symptoms that have not improved with other treatments. In this procedure, the transverse carpal ligament is cut to make more room in the carpal tunnel and reduce pressure on the median nerve. The endoscopic procedure is a minimally invasive procedure that is done through one or two small incisions. A thin scope with a camera (endoscope) is inserted through an incision. Images from the camera are sent to a monitor in the operating room to help the surgeon see inside your body. This type of surgery does not require a long incision that extends into your palm (open surgery). It also has a shorter recovery time. Tell your health care provider about: Any allergies you have. All medicines you are taking, including vitamins, herbs, eye drops, creams, and over-the-counter medicines. Any problems you or family members have had with anesthetic medicines. Any blood disorders you have. Any surgeries you have had. Any medical conditions you have. Whether you are pregnant or may be pregnant. What are the risks? Generally, this is a safe procedure. However, problems may occur, including: Infection. Bleeding. Allergic reactions to medicines. Damage to the nerve or a blood vessel. The need to change to an open procedure. The procedure not being successful. What happens before the surgery? Staying hydrated Follow instructions from your health care provider about hydration, which may include: Up to 2  hours before the procedure - you may continue to drink clear liquids, such as water, clear fruit juice, black coffee, and plain tea.  Eating and drinking restrictions Follow instructions from your health care provider about eating and drinking, which may include: 8 hours before the procedure - stop eating heavy meals or foods, such as meat, fried foods, or fatty foods. 6 hours before the procedure -  stop eating light meals or foods, such as toast or cereal. 6 hours before the procedure - stop drinking milk or drinks that contain milk. 2 hours before the procedure - stop drinking clear liquids. Medicines Ask your health care provider about: Changing or stopping your regular medicines. This is especially important if you are taking diabetes medicines or blood thinners. Taking medicines such as aspirin and ibuprofen. These medicines can thin your blood. Do not take these medicines unless your health care provider tells you to take them. Taking over-the-counter medicines, vitamins, herbs, and supplements. General instructions Do not use any products that contain nicotine or tobacco for at least 4 weeks before the procedure. These products include cigarettes, e-cigarettes, and chewing tobacco. If you need help quitting, ask your health care provider. Plan to have a responsible adult take you home from the hospital or clinic. If you will be going home right after the procedure, plan to have a responsible adult care for you for the time you are told. This is important. Ask your health care provider: How your surgery site will be marked. What steps will be taken to help prevent infection. These may include washing skin with a germ-killing soap. What happens during the surgery?  An IV will be inserted into one of your veins. You will be given one or more of the following: A medicine to help you relax (sedative). A medicine to numb the area (local anesthetic). A medicine that is injected into an area of your body to numb everything below the injection site (regional anesthetic). A small incision will be made in the crease of your wrist, near the bottom of your palm. Another incision may be made in the palm of your hand. Your surgeon may insert a long, thin instrument (dilator) from one incision into the carpal tunnel to make room for the endoscope or the cutting tool. The endoscope will be placed  through one of the incisions. This enables your surgeon to see the carpal tunnel, the median nerve, and the transverse carpal ligament. An endoscopic knife will be inserted through one of the incisions. While viewing the ligament through the endoscope, your surgeon will cut the ligament with an upward motion. The endoscope and knife will be removed, and the incision or incisions will be closed with stitches (sutures). A compression bandage (dressing) will be wrapped around your hand and wrist. The procedure may vary among health care providers and hospitals. What happens after the surgery? Your blood pressure, heart rate, breathing rate, and blood oxygen level will be monitored until you leave the hospital or clinic. You will be given pain medicine as needed. A splint or brace may be placed over your dressing. This will hold your hand and wrist in place while you heal. Do not drive until your health care provider approves. Summary Endoscopic carpal tunnel release is a surgery to relieve symptoms of carpal tunnel syndrome. This procedure is usually done after injecting a medicine (anesthetic) to numb the surgery area. During the procedure, your surgeon will cut the transverse carpal ligament to reduce pressure on the median nerve.  After surgery, your hand and wrist will be wrapped in a compression bandage (dressing). You may have a splint or brace. This information is not intended to replace advice given to you by your health care provider. Make sure you discuss any questions you have with your health care provider. Document Revised: 01/20/2020 Document Reviewed: 01/20/2020 Elsevier Patient Education  2022 [employer].

## 2021-10-18 NOTE — Progress Notes (Signed)
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 Symptoms are not relieved by conservative care.  Informed consent was obtained after describing risks and benefits of the procedure with the patient. These include bleeding bruising and infection as well as nerve injury. Patient elected to proceed and has given written consent. The right distal wrist crease was marked and prepped with Betadine. A 30-gauge 1/2 inch needle was inserted and 0.25 ML's of 1% lidocaine injected into the skin and subcutaneous tissue. Then a 27-gauge 1/2 inch needle was inserted into the carpal tunnel and 0.25 mL of celestone 6mg  per mL was injected.   Patient tolerated procedure well. Post procedure instructions given.  Pt has appt with Ortho hand Dr to discuss CTS surgery

## 2021-10-30 DIAGNOSIS — G5603 Carpal tunnel syndrome, bilateral upper limbs: Secondary | ICD-10-CM | POA: Diagnosis not present

## 2021-11-01 DIAGNOSIS — F419 Anxiety disorder, unspecified: Secondary | ICD-10-CM | POA: Diagnosis not present

## 2021-12-21 DIAGNOSIS — G5601 Carpal tunnel syndrome, right upper limb: Secondary | ICD-10-CM | POA: Diagnosis not present

## 2021-12-21 DIAGNOSIS — G5602 Carpal tunnel syndrome, left upper limb: Secondary | ICD-10-CM | POA: Diagnosis not present

## 2022-01-10 DIAGNOSIS — M25531 Pain in right wrist: Secondary | ICD-10-CM | POA: Diagnosis not present

## 2022-01-15 DIAGNOSIS — G5601 Carpal tunnel syndrome, right upper limb: Secondary | ICD-10-CM | POA: Diagnosis not present

## 2022-01-15 DIAGNOSIS — G5603 Carpal tunnel syndrome, bilateral upper limbs: Secondary | ICD-10-CM | POA: Diagnosis not present

## 2022-01-28 ENCOUNTER — Other Ambulatory Visit (HOSPITAL_COMMUNITY): Payer: Self-pay

## 2022-02-08 NOTE — Progress Notes (Signed)
Office Visit Note  Patient: Brooke Sherman             Date of Birth: 1985/11/25           MRN: [id number]             PCP: Etta Grandchild, MD Referring: Bradly Bienenstock, MD Visit Date: 02/20/2022 Occupation: @  Subjective:  Right hand pain  History of Present Illness: Brooke Sherman is a 36 y.o. female seen in consultation per request of Dr. Melvyn Novas.  According to the patient she has had Raynauds since she was in college.  She states the Raynaud's symptoms are more prominent during the winter months.  She has never had digital ulcers.  In 2019 she had left ACL tear for which she required surgery.  While she was using crutches she started having some upper back pain radiating to her left scapula.  At the time she had shoulder MRI which was negative.  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  She was also seen by a physiatrist in [place] who told her that she has hypermobility.  She still has off-and-on pain in the thoracic region.  She also complains of some lower back pain off-and-on with no radiculopathy.  She works out on a regular basis and [employer].  She tries to avoid lifting weights overhead because it makes her symptoms worse.  She states in December 2021 she gave birth to her.  In April 2022 while she was nursing her baby she started having numbness in her hands.  He was evaluated by a physiatrist who gave her cortisone injections for carpal tunnel and bilateral wrist joints x2.  She has some relief from the cortisone injections.  She also had EMG and nerve conduction velocities to confirm the carpal tunnel syndrome.  As her symptoms persist she was referred to Dr. Melvyn Novas.  She states Dr. Melvyn Novas did x-rays and MRI and the MRI reveals some inflammation in the wrist joint.  He did not suggest carpal tunnel release because she is taking care of her child at home.  He was concerned if there is any autoimmune  process and thus the reason she was referred to me.  She has been experiencing discomfort in her right middle finger and fifth finger and the pain radiates into her wrist.  Has paresthesias in her left hand but no discomfort.  She also has off-and-on discomfort in her bilateral knee joints.  There is family history of autoimmune thyroiditis.  He is gravida 1, para 1.  No history of preeclampsia or DVTs.  Activities of Daily Living:  Patient reports morning stiffness for 0  none .   Patient Denies nocturnal pain.  Difficulty dressing/grooming: Denies Difficulty climbing stairs: Denies Difficulty getting out of chair: Denies Difficulty using hands for taps, buttons, cutlery, and/or writing: Denies  Review of Systems  Constitutional:  Positive for fatigue.  HENT:  Negative for mouth sores and mouth dryness.   Eyes:  Positive for dryness.  Respiratory:  Negative for shortness of breath.   Cardiovascular:  Negative for swelling in legs/feet.  Gastrointestinal:  Positive for diarrhea.  Endocrine: Positive for cold intolerance.  Genitourinary:  Negative for difficulty urinating.  Musculoskeletal:  Positive for joint pain, joint pain and muscle weakness. Negative for joint swelling and morning stiffness.  Skin:  Positive for color change. Negative for rash and sensitivity to sunlight.  Allergic/Immunologic: Negative for susceptible to infections.  Neurological:  Positive for numbness and weakness.  Hematological:  Negative for bruising/bleeding tendency and swollen glands.  Psychiatric/Behavioral:  Negative for depressed mood and sleep disturbance. The patient is not nervous/anxious.    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    Thoracic radiculitis 12/13/2020   Numbness and tingling in both hands 12/13/2020   IUD (intrauterine device) in place 11/28/2020   Hypothyroidism 05/14/2020    Past Medical History:  Diagnosis Date   Asthma    Hypothyroid     Family History  Problem Relation Age of Onset   Cervical cancer Mother    Past Surgical History:  Procedure Laterality Date   ANTERIOR CRUCIATE LIGAMENT REPAIR Left 2019   WISDOM TOOTH EXTRACTION     Social History   Social History Narrative   Not on file   Immunization History  Administered Date(s) Administered   Influenza,inj,Quad PF,6+ Mos 06/12/2020   Influenza-Unspecified 07/11/2021   Pfizer Covid-19 Vaccine Bivalent Booster 38yrs & up 07/11/2021   Tdap 06/12/2020   Unspecified SARS-COV-2 Vaccination 12/12/2019, 01/09/2020, 09/06/2020     Objective: Vital Signs: BP 107/72 (BP Location: Right Arm, Patient Position: Sitting, Cuff Size: Small)   Pulse 66   Resp 12   Ht 5' 4.75" (1.645 m)   Wt 154 lb (69.9 kg)   BMI 25.83 kg/m    Physical Exam Vitals and nursing note reviewed.  Constitutional:      Appearance: She is well-developed.  HENT:     Head: Normocephalic and atraumatic.  Eyes:     Conjunctiva/sclera: Conjunctivae normal.  Cardiovascular:     Rate and Rhythm: Normal rate and regular rhythm.     Heart sounds: Normal heart sounds.  Pulmonary:     Effort: Pulmonary effort is normal.     Breath sounds: Normal breath sounds.  Abdominal:     General: Bowel sounds are normal.     Palpations: Abdomen is soft.  Musculoskeletal:     Cervical back: Normal range of motion.  Lymphadenopathy:     Cervical: No cervical adenopathy.  Skin:    General: Skin is warm and dry.     Capillary Refill: Capillary refill takes less than 2 seconds.     Comments: No digital cyanosis was noted.  She had good capillary refill.  No Telengectesia's were noted.  No nailbed capillary changes were noted.  No sclerodactyly was noted.  Neurological:      Mental Status: She is alert and oriented to person, place, and time.  Psychiatric:        Behavior: Behavior normal.     Musculoskeletal Exam: C-spine thoracic and lumbar spine were in good range of motion.  She had no SI joint tenderness.  Shoulder joints, elbow joints, wrist joints, MCPs PIPs and DIPs with good range of motion.  She had discomfort with range of motion of her right wrist joint but there was no point tenderness.  No synovitis was noted.  She had mild DIP thickening bilaterally.  Hip joints and knee joints in good range of motion without any warmth swelling or effusion.  She is surgical scar over her left knee.  There was no tenderness over ankles or MTPs.  She has thickening of bilateral first MTP joint.  Mild pes  cavus was noted.  CDAI Exam: CDAI Score: -- Patient Global: --; Provider Global: -- Swollen: --; Tender: -- Joint Exam 02/20/2022   No joint exam has been documented for this visit   There is currently no information documented on the homunculus. Go to the Rheumatology activity and complete the homunculus joint exam.  Investigation: No additional findings.  Imaging: No results found.  Recent Labs: Lab Results  Component Value Date   WBC 4.5 11/28/2020   HGB 14.0 11/28/2020   PLT 237.0 11/28/2020    Speciality Comments: No specialty comments available.  Procedures:  No procedures performed Allergies: Patient has no known allergies.   Assessment / Plan:     Visit Diagnoses: Pain in right wrist -she has been experiencing pain in her right wrist joint and right middle and little finger for the last few months.  The symptoms started after she started nursing her baby.  She was initially diagnosed with carpal tunnel syndrome and had injections for carpal tunnel release.  As she had persistent symptoms in her right wrist joint she was evaluated by Dr. Melvyn Novas.  Dr. Melvyn Novas did MRI of her right wrist joint which showed mild degenerative signal and interstitial  fraying of the ulnar attachment at the TFCC.  There was a small amount of synovitis along the volar ulnar no triquetrum and ulnar lunate ligaments.  Mild tenosynovitis and tendinosis of the extensor carpi ulnaris tendon was noted.  Degenerative signal and fraying of the volar scapholunate ligament without tear and diastases was noted.  Mild enlargement of median nerve was also noted.  Patient was sent to me for evaluation of any underlying autoimmune disease.  She had no synovitis on examination.  I will obtain autoimmune labs to evaluate this further.  Some degenerative changes were noted in the DIP joints.  There is family history of osteoarthritis.  She also lifts heavy weights.  She is cut back on the weightlifting.  Hand muscle strengthening exercises were discussed and demonstrated in the office.  A handout was given.  Plan: Sedimentation rate, Rheumatoid factor, Cyclic citrul peptide antibody, IgG, ANA  Bilateral carpal tunnel syndrome-patient states that she had nerve conduction velocities which were consistent with bilateral carpal tunnel syndrome.  She had bilateral carpal tunnel injections by Dr. Claudette Laws.  She had some relief in the carpal tunnel syndrome symptoms but did not completely resolve.  Raynaud's syndrome without gangrene -she gives history of Raynaud's phenomenon since she was in the college.  She states her fingers turn white during the winter months.  The circulation gradually comes back.  Information regarding raynaud's phenominon was provided.  Keeping the core temperature warm and warm clothing was discussed.  I will look for any underlying autoimmune process contributing to Raynauds.  Plan: CBC with Differential/Platelet, COMPLETE METABOLIC PANEL WITH GFR, ANA, Anti-DNA antibody, double-stranded, RNP Antibody, Sjogrens syndrome-B extractable nuclear antibody, Sjogrens syndrome-A extractable nuclear antibody, Anti-Smith antibody, Anti-scleroderma antibody, C3 and C4,  Cardiolipin antibodies, IgG, IgM, IgA, Beta-2 glycoprotein antibodies  Chronic pain of left knee-she had ACL tear in the past and has chronic pain in her knee joint since then.  Thoracic radiculitis-she has some thoracic radiculitis to her left shoulder.  She states that she carries her baby in the left arm.  She states her symptoms a started when she had knee surgery and she was using the crutches.  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  History  of hypothyroidism-she is on Synthroid.  IUD (intrauterine device) in place-Mirena IUD  Orders: Orders Placed This Encounter  Procedures   CBC with Differential/Platelet   COMPLETE METABOLIC PANEL WITH GFR   Sedimentation rate   Rheumatoid factor   Cyclic citrul peptide antibody, IgG   ANA   Anti-DNA antibody, double-stranded   RNP Antibody   Sjogrens syndrome-B extractable nuclear antibody   Sjogrens syndrome-A extractable nuclear antibody   Anti-Smith antibody   Anti-scleroderma antibody   C3 and C4   Cardiolipin antibodies, IgG, IgM, IgA   Beta-2 glycoprotein antibodies   No orders of the defined types were placed in this encounter.    Follow-Up Instructions: Return for Joint pain, Raynauds.   Pollyann SavoyShaili Jemarcus Dougal, MD  Note - This record has been created using [employer] software.  Chart creation errors have been sought, but may not always  have been located. Such creation errors do not reflect on  the standard of medical care.

## 2022-02-12 DIAGNOSIS — G5601 Carpal tunnel syndrome, right upper limb: Secondary | ICD-10-CM | POA: Diagnosis not present

## 2022-02-20 ENCOUNTER — Encounter: Payer: Self-pay | Admitting: Rheumatology

## 2022-02-20 ENCOUNTER — Ambulatory Visit (INDEPENDENT_AMBULATORY_CARE_PROVIDER_SITE_OTHER): Payer: 59 | Admitting: Rheumatology

## 2022-02-20 VITALS — BP 107/72 | HR 66 | Resp 12 | Ht 64.75 in | Wt 154.0 lb

## 2022-02-20 DIAGNOSIS — G5603 Carpal tunnel syndrome, bilateral upper limbs: Secondary | ICD-10-CM | POA: Diagnosis not present

## 2022-02-20 DIAGNOSIS — M25531 Pain in right wrist: Secondary | ICD-10-CM

## 2022-02-20 DIAGNOSIS — M5124 Other intervertebral disc displacement, thoracic region: Secondary | ICD-10-CM

## 2022-02-20 DIAGNOSIS — M25562 Pain in left knee: Secondary | ICD-10-CM | POA: Diagnosis not present

## 2022-02-20 DIAGNOSIS — I7300 Raynaud's syndrome without gangrene: Secondary | ICD-10-CM | POA: Diagnosis not present

## 2022-02-20 DIAGNOSIS — M5414 Radiculopathy, thoracic region: Secondary | ICD-10-CM | POA: Diagnosis not present

## 2022-02-20 DIAGNOSIS — G8929 Other chronic pain: Secondary | ICD-10-CM

## 2022-02-20 DIAGNOSIS — Z8639 Personal history of other endocrine, nutritional and metabolic disease: Secondary | ICD-10-CM

## 2022-02-20 DIAGNOSIS — Z975 Presence of (intrauterine) contraceptive device: Secondary | ICD-10-CM | POA: Diagnosis not present

## 2022-02-20 NOTE — Patient Instructions (Addendum)
Hand Exercises Hand exercises can be helpful for almost anyone. These exercises can strengthen the hands, improve flexibility and movement, and increase blood flow to the hands. These results can make work and daily tasks easier. Hand exercises can be especially helpful for people who have joint pain from arthritis or have nerve damage from overuse (carpal tunnel syndrome). These exercises can also help people who have injured a hand. Exercises Most of these hand exercises are gentle stretching and motion exercises. It is usually safe to do them often throughout the day. Warming up your hands before exercise may help to reduce stiffness. You can do this with gentle massage or by placing your hands in warm water for 10-15 minutes. It is normal to feel some stretching, pulling, tightness, or mild discomfort as you begin new exercises. This will gradually improve. Stop an exercise right away if you feel sudden, severe pain or your pain gets worse. Ask your health care provider which exercises are best for you. Knuckle bend or "claw" fist  Stand or sit with your arm, hand, and all five fingers pointed straight up. Make sure to keep your wrist straight during the exercise. Gently bend your fingers down toward your palm until the tips of your fingers are touching the top of your palm. Keep your big knuckle straight and just bend the small knuckles in your fingers. Hold this position for __________ seconds. Straighten (extend) your fingers back to the starting position. Repeat this exercise 5-10 times with each hand. Full finger fist  Stand or sit with your arm, hand, and all five fingers pointed straight up. Make sure to keep your wrist straight during the exercise. Gently bend your fingers into your palm until the tips of your fingers are touching the middle of your palm. Hold this position for __________ seconds. Extend your fingers back to the starting position, stretching every joint fully. Repeat  this exercise 5-10 times with each hand. Straight fist Stand or sit with your arm, hand, and all five fingers pointed straight up. Make sure to keep your wrist straight during the exercise. Gently bend your fingers at the big knuckle, where your fingers meet your hand, and the middle knuckle. Keep the knuckle at the tips of your fingers straight and try to touch the bottom of your palm. Hold this position for __________ seconds. Extend your fingers back to the starting position, stretching every joint fully. Repeat this exercise 5-10 times with each hand. Tabletop  Stand or sit with your arm, hand, and all five fingers pointed straight up. Make sure to keep your wrist straight during the exercise. Gently bend your fingers at the big knuckle, where your fingers meet your hand, as far down as you can while keeping the small knuckles in your fingers straight. Think of forming a tabletop with your fingers. Hold this position for __________ seconds. Extend your fingers back to the starting position, stretching every joint fully. Repeat this exercise 5-10 times with each hand. Finger spread  Place your hand flat on a table with your palm facing down. Make sure your wrist stays straight as you do this exercise. Spread your fingers and thumb apart from each other as far as you can until you feel a gentle stretch. Hold this position for __________ seconds. Bring your fingers and thumb tight together again. Hold this position for __________ seconds. Repeat this exercise 5-10 times with each hand. Making circles  Stand or sit with your arm, hand, and all five fingers pointed   straight up. Make sure to keep your wrist straight during the exercise. Make a circle by touching the tip of your thumb to the tip of your index finger. Hold for __________ seconds. Then open your hand wide. Repeat this motion with your thumb and each finger on your hand. Repeat this exercise 5-10 times with each hand. Thumb  motion  Sit with your forearm resting on a table and your wrist straight. Your thumb should be facing up toward the ceiling. Keep your fingers relaxed as you move your thumb. Lift your thumb up as high as you can toward the ceiling. Hold for __________ seconds. Bend your thumb across your palm as far as you can, reaching the tip of your thumb for the small finger (pinkie) side of your palm. Hold for __________ seconds. Repeat this exercise 5-10 times with each hand. Grip strengthening  Hold a stress ball or other soft ball in the middle of your hand. Slowly increase the pressure, squeezing the ball as much as you can without causing pain. Think of bringing the tips of your fingers into the middle of your palm. All of your finger joints should bend when doing this exercise. Hold your squeeze for __________ seconds, then relax. Repeat this exercise 5-10 times with each hand. Contact a health care provider if: Your hand pain or discomfort gets much worse when you do an exercise. Your hand pain or discomfort does not improve within 2 hours after you exercise. If you have any of these problems, stop doing these exercises right away. Do not do them again unless your health care provider says that you can. Get help right away if: You develop sudden, severe hand pain or swelling. If this happens, stop doing these exercises right away. Do not do them again unless your health care provider says that you can. This information is not intended to replace advice given to you by your health care provider. Make sure you discuss any questions you have with your health care provider. Document Revised: 01/04/2021 Document Reviewed: 01/04/2021 Elsevier Patient Education  2023 [employer] Raynaud's Phenomenon  Raynaud's phenomenon is a condition that affects the blood vessels (arteries) that carry blood to the fingers and toes. The arteries that supply blood to the ears, lips, nipples, or the tip of the nose  might also be affected. Raynaud's phenomenon causes the arteries to become narrow temporarily (spasm). As a result, the flow of blood to the affected areas is temporarily decreased. This usually occurs in response to cold temperatures or stress. During an attack, the skin in the affected areas turns white, then blue, and finally red. A person may also feel tingling or numbness in those areas. Attacks usually last for only a brief period, and then the blood flow to the area returns to normal. In most cases, Raynaud's phenomenon does not cause serious health problems. What are the causes? In many cases, the cause of this condition is not known. The condition may occur on its own (primary Raynaud's phenomenon) or may be associated with other diseases or factors (secondary Raynaud's phenomenon). Possible causes may include: Diseases or medical conditions that damage the arteries. Injuries and repetitive actions that hurt the hands or feet. Being exposed to certain chemicals. Taking medicines that narrow the arteries. Other medical conditions, such as lupus, scleroderma, rheumatoid arthritis, thyroid problems, blood disorders, Sjogren syndrome, or atherosclerosis. What increases the risk? The following factors may make you more likely to develop this condition: Being 48-74 years old. Being female.  Having a family history of Raynaud's phenomenon. Living in a cold climate. Smoking. What are the signs or symptoms? Symptoms of this condition usually occur when you are exposed to cold temperatures or when you have emotional stress. The symptoms may last for a few minutes or up to several hours. They usually affect your fingers but may also affect your toes, nipples, lips, ears, or the tip of your nose. Symptoms may include: Changes in skin color. The skin in the affected areas will turn pale or white. The skin may then change from white to bluish to red as normal blood flow returns to the area. Numbness,  tingling, or pain in the affected areas. In severe cases, symptoms may include: Skin sores. Tissues decaying and dying (gangrene). How is this diagnosed? This condition may be diagnosed based on: Your symptoms and medical history. A physical exam. During the exam, you may be asked to put your hands in cold water to check for a reaction to cold temperature. Tests, such as: Blood tests to check for other diseases or conditions. A test to check the movement of blood through your arteries and veins (vascular ultrasound). A test in which the skin at the base of your fingernail is examined under a microscope (nailfold capillaroscopy). How is this treated? During an episode, you can take actions to help symptoms go away faster. Options include moving your arms around in a windmill pattern, warming your fingers under warm water, or placing your fingers in a warm body fold, such as your armpit. Long-term treatment for this condition often involves making lifestyle changes and taking steps to control your exposure to cold temperature. For more severe cases, medicine (calcium channel blockers) may be used to improve blood circulation. Follow these instructions at home: Avoiding cold temperatures Take these steps to avoid exposure to cold: If possible, stay indoors during cold weather. When you go outside during cold weather, dress in layers and wear mittens, a hat, a scarf, and warm footwear. Wear mittens or gloves when handling ice or frozen food. Use holders for glasses or cans containing cold drinks. Let warm water run for a while before taking a shower or bath. Warm up the car before driving in cold weather. Lifestyle If possible, avoid stressful and emotional situations. Try to find ways to manage your stress, such as: Exercise. Yoga. Meditation. Biofeedback. Do not use any products that contain nicotine or tobacco. These products include cigarettes, chewing tobacco, and vaping devices, such  as e-cigarettes. If you need help quitting, ask your health care provider. Avoid secondhand smoke. Limit your use of caffeine. Switch to decaffeinated coffee, tea, and soda. Avoid chocolate. Avoid vibrating tools and machinery. General instructions Protect your hands and feet from injuries, cuts, or bruises. Avoid wearing tight rings or wristbands. Wear loose fitting socks and comfortable, roomy shoes. Take over-the-counter and prescription medicines only as told by your health care provider. Where to find support Raynaud's Association: www.raynauds.org Where to find more information [employer]culoskeletal [employer] Skin [employer]: www.niams.http://www.myers.net/nih.gov Contact a health care provider if: Your discomfort becomes worse despite lifestyle changes. You develop sores on your fingers or toes that do not heal. You have breaks in the skin on your fingers or toes. You have a fever. You have pain or swelling in your joints. You have a rash. Your symptoms occur on only one side of your body. Get help right away if: Your fingers or toes turn black. You have severe pain in the affected  areas. These symptoms may represent a serious problem that is an emergency. Do not wait to see if the symptoms will go away. Get medical help right away. Call your local emergency services (911 in the [place] Do not drive yourself to the hospital. Summary Raynaud's phenomenon is a condition that affects the arteries that carry blood to the fingers, toes, ears, lips, nipples, or the tip of the nose. In many cases, the cause of this condition is not known. Symptoms of this condition include changes in skin color along with numbness and tingling in the affected area. Treatment for this condition includes lifestyle changes and reducing exposure to cold temperatures. Medicines may be used for severe cases of the condition. Contact your health care provider if your condition worsens despite  treatment. This information is not intended to replace advice given to you by your health care provider. Make sure you discuss any questions you have with your health care provider. Document Revised: 11/21/2020 Document Reviewed: 11/21/2020 Elsevier Patient Education  2023 [employer].

## 2022-02-26 LAB — COMPLETE METABOLIC PANEL WITH GFR
AG Ratio: 1.7 (calc) (ref 1.0–2.5)
ALT: 12 U/L (ref 6–29)
AST: 18 U/L (ref 10–30)
Albumin: 4.5 g/dL (ref 3.6–5.1)
Alkaline phosphatase (APISO): 58 U/L (ref 31–125)
BUN: 15 mg/dL (ref 7–25)
CO2: 26 mmol/L (ref 20–32)
Calcium: 9.7 mg/dL (ref 8.6–10.2)
Chloride: 105 mmol/L (ref 98–110)
Creat: 0.64 mg/dL (ref 0.50–0.97)
Globulin: 2.6 g/dL (calc) (ref 1.9–3.7)
Glucose, Bld: 80 mg/dL (ref 65–99)
Potassium: 4.3 mmol/L (ref 3.5–5.3)
Sodium: 139 mmol/L (ref 135–146)
Total Bilirubin: 0.7 mg/dL (ref 0.2–1.2)
Total Protein: 7.1 g/dL (ref 6.1–8.1)
eGFR: 118 mL/min/{1.73_m2} (ref >=60)

## 2022-02-26 LAB — ANTI-NUCLEAR AB-TITER (ANA TITER): ANA Titer 1: 1:40 {titer} — ABNORMAL HIGH

## 2022-02-26 LAB — CBC WITH DIFFERENTIAL/PLATELET
Absolute Monocytes: 348 cells/uL (ref 200–950)
Basophils Absolute: 48 cells/uL (ref 0–200)
Basophils Relative: 1.3 %
Eosinophils Absolute: 81 cells/uL (ref 15–500)
Eosinophils Relative: 2.2 %
HCT: 41.3 % (ref 35.0–45.0)
Hemoglobin: 13.6 g/dL (ref 11.7–15.5)
Lymphs Abs: 1095 cells/uL (ref 850–3900)
MCH: 28.2 pg (ref 27.0–33.0)
MCHC: 32.9 g/dL (ref 32.0–36.0)
MCV: 85.7 fL (ref 80.0–100.0)
MPV: 11.4 fL (ref 7.5–12.5)
Monocytes Relative: 9.4 %
Neutro Abs: 2128 cells/uL (ref 1500–7800)
Neutrophils Relative %: 57.5 %
Platelets: 220 10*3/uL (ref 140–400)
RBC: 4.82 10*6/uL (ref 3.80–5.10)
RDW: 12.2 % (ref 11.0–15.0)
Total Lymphocyte: 29.6 %
WBC: 3.7 10*3/uL — ABNORMAL LOW (ref 3.8–10.8)

## 2022-02-26 LAB — C3 AND C4
C3 Complement: 94 mg/dL (ref 83–193)
C4 Complement: 11 mg/dL — ABNORMAL LOW (ref 15–57)

## 2022-02-26 LAB — ANA: Anti Nuclear Antibody (ANA): POSITIVE — AB

## 2022-02-26 LAB — CARDIOLIPIN ANTIBODIES, IGG, IGM, IGA
Anticardiolipin IgA: <2 APL-U/mL (ref <20.0)
Anticardiolipin IgG: <2 GPL-U/mL (ref <20.0)
Anticardiolipin IgM: <2 MPL-U/mL (ref <20.0)

## 2022-02-26 LAB — RNP ANTIBODY: Ribonucleic Protein(ENA) Antibody, IgG: <1 AI

## 2022-02-26 LAB — ANTI-SMITH ANTIBODY: ENA SM Ab Ser-aCnc: <1 AI

## 2022-02-26 LAB — CYCLIC CITRUL PEPTIDE ANTIBODY, IGG: Cyclic Citrullin Peptide Ab: <16 UNITS

## 2022-02-26 LAB — SJOGRENS SYNDROME-B EXTRACTABLE NUCLEAR ANTIBODY: SSB (La) (ENA) Antibody, IgG: <1 AI

## 2022-02-26 LAB — BETA-2 GLYCOPROTEIN ANTIBODIES
Beta-2 Glyco 1 IgA: <2 U/mL (ref <20.0)
Beta-2 Glyco 1 IgM: <2 U/mL (ref <20.0)
Beta-2 Glyco I IgG: <2 U/mL (ref <20.0)

## 2022-02-26 LAB — SJOGRENS SYNDROME-A EXTRACTABLE NUCLEAR ANTIBODY: SSA (Ro) (ENA) Antibody, IgG: <1 AI

## 2022-02-26 LAB — SEDIMENTATION RATE: Sed Rate: 2 mm/h (ref 0–20)

## 2022-02-26 LAB — ANTI-SCLERODERMA ANTIBODY: Scleroderma (Scl-70) (ENA) Antibody, IgG: <1 AI

## 2022-02-26 LAB — ANTI-DNA ANTIBODY, DOUBLE-STRANDED: ds DNA Ab: 2 IU/mL

## 2022-02-26 LAB — RHEUMATOID FACTOR: Rheumatoid fact SerPl-aCnc: <14 IU/mL (ref <14)

## 2022-02-26 NOTE — Progress Notes (Signed)
I will discuss results at the follow-up visit.

## 2022-03-05 DIAGNOSIS — L603 Nail dystrophy: Secondary | ICD-10-CM | POA: Diagnosis not present

## 2022-03-05 DIAGNOSIS — L7211 Pilar cyst: Secondary | ICD-10-CM | POA: Diagnosis not present

## 2022-03-15 NOTE — Progress Notes (Unsigned)
Office Visit Note  Patient: Brooke Sherman             Date of Birth: 16-Jun-1986           MRN: [id number]             PCP: Brooke Lima, MD Referring: Brooke Lima, MD Visit Date: 03/20/2022 Occupation: '@GUAROCC' @  Subjective:  Pain in right wrist  History of Present Illness: Brooke Sherman is a 36 y.o. female with history of pain in right wrist and hand, Raynauds and thoracic pain returns for follow-up visit today.  She states she has done some modifications in her workout which helped to some extent.  But when she resumed the exercises the pain came back.  She has been using bilateral carpal tunnel braces but is still has discomfort in her bilateral hands due to carpal tunnel syndrome.  Raynauds is more active during the winter months.  She is currently not having symptoms.  She continues to have some discomfort in her left knee joint.  She also has popping sensation in her left shoulder and thoracic discomfort.  She denies any history of oral ulcers, nasal ulcers, malar rash, photosensitivity, sicca symptoms or lymphadenopathy.  Activities of Daily Living:  Patient reports morning stiffness for 0  none .   Patient Denies nocturnal pain.  Difficulty dressing/grooming: Denies Difficulty climbing stairs: Denies Difficulty getting out of chair: Denies Difficulty using hands for taps, buttons, cutlery, and/or writing: Denies  Review of Systems  Constitutional:  Positive for fatigue.  HENT:  Negative for mouth dryness.   Eyes:  Negative for dryness.  Respiratory:  Negative for shortness of breath.   Cardiovascular:  Negative for swelling in legs/feet.  Gastrointestinal:  Negative for constipation.  Endocrine: Negative for excessive thirst.  Genitourinary:  Negative for difficulty urinating.  Musculoskeletal:  Positive for joint pain and joint pain.  Skin:  Negative for rash.  Allergic/Immunologic: Negative for susceptible to infections.  Neurological:   Positive for numbness.  Hematological:  Negative for bruising/bleeding tendency.  Psychiatric/Behavioral:  Negative for sleep disturbance.     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   Thoracic radiculitis 12/13/2020   Numbness and tingling in both hands 12/13/2020   IUD (intrauterine device) in place 11/28/2020   Hypothyroidism 05/14/2020    Past Medical History:  Diagnosis Date   Asthma    Carpal tunnel syndrome    Hypothyroid    Raynaud's disease     Family History  Problem Relation Age of Onset   Cervical cancer Mother    Past Surgical History:  Procedure Laterality Date   ANTERIOR CRUCIATE LIGAMENT REPAIR Left 2019   WISDOM TOOTH EXTRACTION     Social History   Social History Narrative   Not on file   Immunization History  Administered Date(s) Administered   Influenza,inj,Quad PF,6+ Mos 06/12/2020   Influenza-Unspecified 07/11/2021   Pfizer Covid-19 Vaccine Bivalent Booster 8yr & up 07/11/2021   Tdap 06/12/2020   Unspecified SARS-COV-2 Vaccination 12/12/2019, 01/09/2020, 09/06/2020     Objective: Vital Signs: BP 109/71 (BP Location: Left Arm, Patient Position: Sitting, Cuff Size: Normal)   Pulse 64   Resp 14   Ht '5\' 5"'  (1.651 m)   Wt 153  lb (69.4 kg)   BMI 25.46 kg/m    Physical Exam Vitals and nursing note reviewed.  Constitutional:      Appearance: She is well-developed.  HENT:     Head: Normocephalic and atraumatic.  Eyes:     Conjunctiva/sclera: Conjunctivae normal.  Cardiovascular:     Rate and Rhythm: Normal rate and regular rhythm.     Heart sounds: Normal heart sounds.  Pulmonary:     Effort: Pulmonary effort is normal.     Breath sounds: Normal breath sounds.   Abdominal:     General: Bowel sounds are normal.     Palpations: Abdomen is soft.  Musculoskeletal:     Cervical back: Normal range of motion.  Lymphadenopathy:     Cervical: No cervical adenopathy.  Skin:    General: Skin is warm and dry.     Capillary Refill: Capillary refill takes less than 2 seconds.  Neurological:     Mental Status: She is alert and oriented to person, place, and time.  Psychiatric:        Behavior: Behavior normal.      Musculoskeletal Exam: C-spine thoracic and lumbar spine were in good range of motion.  She had no point tenderness.  Shoulder joints, elbow joints, wrist joints, MCPs PIPs and DIPs with good range of motion.  She had bilateral DIP thickening with no synovitis.  Hip joints and knee joints in good range of motion.  There was no tenderness over ankles or MTPs.  CDAI Exam: CDAI Score: -- Patient Global: --; Provider Global: -- Swollen: --; Tender: -- Joint Exam 03/20/2022   No joint exam has been documented for this visit   There is currently no information documented on the homunculus. Go to the Rheumatology activity and complete the homunculus joint exam.  Investigation: No additional findings.  Imaging: No results found.  Recent Labs: Lab Results  Component Value Date   WBC 3.7 (L) 02/20/2022   HGB 13.6 02/20/2022   PLT 220 02/20/2022   NA 139 02/20/2022   K 4.3 02/20/2022   CL 105 02/20/2022   CO2 26 02/20/2022   GLUCOSE 80 02/20/2022   BUN 15 02/20/2022   CREATININE 0.64 02/20/2022   BILITOT 0.7 02/20/2022   AST 18 02/20/2022   ALT 12 02/20/2022   PROT 7.1 02/20/2022   CALCIUM 9.7 02/20/2022      Speciality Comments: No specialty comments available.  Procedures:  No procedures performed Allergies: Patient has no known allergies.   Assessment / Plan:     Visit Diagnoses: Pain in right wrist-she presented with pain in right wrist 2, right middle and little finger.  She had MRI by Dr. Caralyn Guile of her wrist joint  which showed mild degenerative changes and interstitial fraying of the ulnar attachment of the TFCC.  Small amount of synovitis was also noted.  Mild enlargement of median nerve was noted.  She states that her symptoms eased off when she stopped lifting weights but then the symptoms recurred after resuming her exercises.  She had no synovitis on examination.  She had bilateral PIP and DIP thickening consistent with osteoarthritis.  Primary osteoarthritis of both hands-she has bilateral DIP thickening consistent with osteoarthritis.  Joint protection muscle strengthening was discussed.  Bilateral carpal tunnel syndrome-her symptoms a started in the postpartum when she started nursing her baby.  She has had carpal tunnel injections with some relief.  She is planning to try braces for a while.  If her symptoms do not improve  then she may consider A general release.  Positive ANA (antinuclear antibody) -she is low titer ANA at 1: 40 NS which is not a significant titer.  C4 was also low.  She has mild neutropenia. Feb 20, 2022 WBC 3.7, ANA 1: 40NS, ENA negative, C3 94, C4 11, anticardiolipin negative, beta-2 GP 1 negative, ESR 2, RF negative, anti-CCP negative.  Left findings were discussed with patient at length.  I will repeat labs in about 6 months.  I also advised her to contact me if she develops any new symptoms.  She denies any history of oral ulcers, nasal ulcers, malar rash, photosensitivity, lymphadenopathy or sicca symptoms.  Plan: CBC with Differential/Platelet, COMPLETE METABOLIC PANEL WITH GFR, Protein / creatinine ratio, urine, Anti-DNA antibody, double-stranded, C3 and C4, Sedimentation rate, ANA  Raynaud's syndrome without gangrene-Raynauds and more prominent during the winter months.  She had good capillary refill.  No nailbed capillary changes or sclerodactyly was noted.  Chronic pain of left knee-she relates the knee joint pain due to previous ACL tear surgery.  No warmth swelling or  effusion was noted.  Thoracic radiculitis-according to the patient the symptoms started when she use the crutches after the knee surgery and then from carrying her baby in her left arm.  Protrusion of thoracic intervertebral disc-diagnosed in [place].  History of hypothyroidism-she is on Synthroid.  IUD (intrauterine device) in place  Orders: Orders Placed This Encounter  Procedures   CBC with Differential/Platelet   COMPLETE METABOLIC PANEL WITH GFR   Protein / creatinine ratio, urine   Anti-DNA antibody, double-stranded   C3 and C4   Sedimentation rate   ANA   No orders of the defined types were placed in this encounter.    Follow-Up Instructions: Return in about 6 months (around 09/19/2022) for +ANA, OA, Raynauds.   Bo Merino, MD  Note - This record has been created using Editor, commissioning.  Chart creation errors have been sought, but may not always  have been located. Such creation errors do not reflect on  the standard of medical care.

## 2022-03-20 ENCOUNTER — Encounter: Payer: Self-pay | Admitting: Rheumatology

## 2022-03-20 ENCOUNTER — Ambulatory Visit (INDEPENDENT_AMBULATORY_CARE_PROVIDER_SITE_OTHER): Payer: 59 | Admitting: Rheumatology

## 2022-03-20 VITALS — BP 109/71 | HR 64 | Resp 14 | Ht 65.0 in | Wt 153.0 lb

## 2022-03-20 DIAGNOSIS — I7300 Raynaud's syndrome without gangrene: Secondary | ICD-10-CM | POA: Diagnosis not present

## 2022-03-20 DIAGNOSIS — G8929 Other chronic pain: Secondary | ICD-10-CM

## 2022-03-20 DIAGNOSIS — M19041 Primary osteoarthritis, right hand: Secondary | ICD-10-CM

## 2022-03-20 DIAGNOSIS — M25562 Pain in left knee: Secondary | ICD-10-CM

## 2022-03-20 DIAGNOSIS — G5603 Carpal tunnel syndrome, bilateral upper limbs: Secondary | ICD-10-CM | POA: Diagnosis not present

## 2022-03-20 DIAGNOSIS — M5124 Other intervertebral disc displacement, thoracic region: Secondary | ICD-10-CM

## 2022-03-20 DIAGNOSIS — M5414 Radiculopathy, thoracic region: Secondary | ICD-10-CM | POA: Diagnosis not present

## 2022-03-20 DIAGNOSIS — Z975 Presence of (intrauterine) contraceptive device: Secondary | ICD-10-CM | POA: Diagnosis not present

## 2022-03-20 DIAGNOSIS — Z8639 Personal history of other endocrine, nutritional and metabolic disease: Secondary | ICD-10-CM

## 2022-03-20 DIAGNOSIS — M25531 Pain in right wrist: Secondary | ICD-10-CM | POA: Diagnosis not present

## 2022-03-20 DIAGNOSIS — R768 Other specified abnormal immunological findings in serum: Secondary | ICD-10-CM

## 2022-03-20 DIAGNOSIS — M19042 Primary osteoarthritis, left hand: Secondary | ICD-10-CM

## 2022-03-20 NOTE — Patient Instructions (Signed)
Please get labs 10 to 14 days prior to your appointment.

## 2022-04-24 ENCOUNTER — Other Ambulatory Visit: Payer: Self-pay | Admitting: Internal Medicine

## 2022-04-24 ENCOUNTER — Other Ambulatory Visit (HOSPITAL_COMMUNITY): Payer: Self-pay

## 2022-04-24 DIAGNOSIS — E039 Hypothyroidism, unspecified: Secondary | ICD-10-CM

## 2022-04-24 MED ORDER — LEVOTHYROXINE SODIUM 25 MCG PO TABS
25.0000 ug | ORAL_TABLET | ORAL | 1 refills | Status: DC
  Filled 2022-04-24: qty 45, 90d supply, fill #0
  Filled 2022-08-14: qty 45, 90d supply, fill #1

## 2022-04-24 MED ORDER — LEVOTHYROXINE SODIUM 50 MCG PO TABS
50.0000 ug | ORAL_TABLET | ORAL | 1 refills | Status: DC
  Filled 2022-04-24: qty 45, 90d supply, fill #0
  Filled 2022-08-14: qty 45, 90d supply, fill #1

## 2022-04-26 ENCOUNTER — Other Ambulatory Visit (HOSPITAL_COMMUNITY): Payer: Self-pay

## 2022-05-08 ENCOUNTER — Other Ambulatory Visit (HOSPITAL_COMMUNITY): Payer: Self-pay

## 2022-05-14 ENCOUNTER — Ambulatory Visit: Payer: 59 | Admitting: Obstetrics and Gynecology

## 2022-06-06 ENCOUNTER — Encounter: Payer: Self-pay | Admitting: Obstetrics and Gynecology

## 2022-06-06 ENCOUNTER — Ambulatory Visit (INDEPENDENT_AMBULATORY_CARE_PROVIDER_SITE_OTHER): Payer: 59 | Admitting: Obstetrics and Gynecology

## 2022-06-06 VITALS — BP 115/66 | HR 49 | Wt 154.0 lb

## 2022-06-06 DIAGNOSIS — Z30432 Encounter for removal of intrauterine contraceptive device: Secondary | ICD-10-CM

## 2022-06-06 NOTE — Progress Notes (Signed)
Pt is in office for IUD removal - planning to conceive. Pt would like to discuss questions about Mirena and fatigue.

## 2022-06-06 NOTE — Progress Notes (Signed)
36 yo P1 here for IUD removal. Patient is planning to conceive in the next month  GYNECOLOGY CLINIC PROCEDURE NOTE  Brooke Sherman is a 36 y.o. G1P1001 here for IUD removal. No GYN concerns.  Last pap smear was in 2021 and was normal.  IUD Removal  Patient identified, informed consent performed, consent signed.  Patient was in the dorsal lithotomy position, normal external genitalia was noted.  A speculum was placed in the patient's vagina, normal discharge was noted, no lesions. The cervix was visualized, no lesions, no abnormal discharge.  The strings of the IUD were grasped and pulled using ring forceps. The IUD was removed in its entirety. Patient tolerated the procedure well.    Patient plans for pregnancy soon and she was told to avoid teratogens, take PNV and folic acid.  Routine preventative health maintenance measures emphasized. Patient plans to follow up with endocrinologist for follow up on hypothyroidism

## 2022-06-24 ENCOUNTER — Ambulatory Visit: Payer: 59 | Admitting: Advanced Practice Midwife

## 2022-07-16 ENCOUNTER — Ambulatory Visit: Payer: 59 | Admitting: Rheumatology

## 2022-07-24 ENCOUNTER — Encounter: Payer: Self-pay | Admitting: Internal Medicine

## 2022-07-24 ENCOUNTER — Ambulatory Visit: Payer: 59 | Admitting: Internal Medicine

## 2022-07-24 VITALS — BP 106/64 | HR 62 | Temp 98.3°F | Ht 65.0 in | Wt 154.0 lb

## 2022-07-24 DIAGNOSIS — E039 Hypothyroidism, unspecified: Secondary | ICD-10-CM

## 2022-07-24 DIAGNOSIS — Z0001 Encounter for general adult medical examination with abnormal findings: Secondary | ICD-10-CM | POA: Insufficient documentation

## 2022-07-24 LAB — TSH: TSH: 2.15 u[IU]/mL (ref 0.35–5.50)

## 2022-07-24 NOTE — Progress Notes (Signed)
Subjective:  Patient ID: Brooke Sherman, female    DOB: Feb 14, 1986  Age: 36 y.o. MRN: [id number]  CC: Annual Exam and Hypothyroidism   HPI Brooke Sherman presents for a CPX and f/up -   Brooke Sherman feels well. Has no complaints.  Outpatient Medications Prior to Visit  Medication Sig Dispense Refill   levothyroxine (SYNTHROID) 25 MCG tablet TAKE 1 TABLET (25 MCG TOTAL) BY MOUTH EVERY OTHER DAY. 45 tablet 1   levothyroxine (SYNTHROID) 50 MCG tablet TAKE 1 TABLET (50 MCG TOTAL) BY MOUTH EVERY OTHER DAY. 45 tablet 1   levonorgestrel (MIRENA) 20 MCG/DAY IUD 1 each by Intrauterine route once for 1 dose. 1 each 0   Prenatal Vit-Fe Fumarate-FA (PRENATAL VITAMINS PO) Take by mouth. (Patient not taking: Reported on 03/20/2022)     No facility-administered medications prior to visit.    ROS Review of Systems  Constitutional:  Negative for diaphoresis and fatigue.  HENT: Negative.    Eyes: Negative.   Respiratory:  Negative for cough, chest tightness, shortness of breath and wheezing.   Cardiovascular:  Negative for chest pain, palpitations and leg swelling.  Gastrointestinal:  Negative for abdominal pain, constipation, diarrhea, nausea and vomiting.  Endocrine: Negative.  Negative for cold intolerance and heat intolerance.  Genitourinary: Negative.   Musculoskeletal: Negative.   Skin: Negative.   Neurological:  Negative for dizziness, weakness and headaches.  Hematological:  Negative for adenopathy. Does not bruise/bleed easily.  Psychiatric/Behavioral: Negative.      Objective:  BP 106/64 (BP Location: Left Arm, Patient Position: Sitting, Cuff Size: Large)   Pulse 62   Temp 98.3 F (36.8 C) (Oral)   Ht 5\' 5"  (1.651 m)   Wt 154 lb (69.9 kg)   SpO2 97%   BMI 25.63 kg/m   BP Readings from Last 3 Encounters:  07/24/22 106/64  06/06/22 115/66  03/20/22 109/71    Wt Readings from Last 3 Encounters:  07/24/22 154 lb (69.9 kg)  06/06/22 154 lb (69.9 kg)   03/20/22 153 lb (69.4 kg)    Physical Exam Vitals reviewed.  HENT:     Nose: Nose normal.     Mouth/Throat:     Mouth: Mucous membranes are moist.  Eyes:     General: No scleral icterus.    Conjunctiva/sclera: Conjunctivae normal.  Cardiovascular:     Rate and Rhythm: Normal rate and regular rhythm.     Heart sounds: No murmur heard. Pulmonary:     Effort: Pulmonary effort is normal.     Breath sounds: No stridor. No wheezing, rhonchi or rales.  Abdominal:     General: Abdomen is flat.     Palpations: There is no mass.     Tenderness: There is no abdominal tenderness. There is no guarding.     Hernia: No hernia is present.  Musculoskeletal:        General: Normal range of motion.     Cervical back: Neck supple.     Right lower leg: No edema.     Left lower leg: No edema.  Lymphadenopathy:     Cervical: No cervical adenopathy.  Skin:    General: Skin is warm and dry.  Neurological:     General: No focal deficit present.     Mental Status: Brooke Sherman is alert.  Psychiatric:        Mood and Affect: Mood normal.        Behavior: Behavior normal.     Lab Results  Component Value Date  WBC 3.7 (L) 02/20/2022   HGB 13.6 02/20/2022   HCT 41.3 02/20/2022   PLT 220 02/20/2022   GLUCOSE 80 02/20/2022   CHOL 195 11/28/2020   TRIG 100.0 11/28/2020   HDL 60.30 11/28/2020   LDLCALC 115 (H) 11/28/2020   ALT 12 02/20/2022   AST 18 02/20/2022   NA 139 02/20/2022   K 4.3 02/20/2022   CL 105 02/20/2022   CREATININE 0.64 02/20/2022   BUN 15 02/20/2022   CO2 26 02/20/2022   TSH 2.15 07/24/2022    MR Thoracic Spine Wo Contrast  Result Date: 01/04/2021 CLINICAL DATA:  Radiculitis of right cervical region. Numbness and tingling of both hands. Disc disease, degenerative, cervical. Thoracic radiculitis. EXAM: MRI CERVICAL AND THORACIC SPINE WITHOUT CONTRAST TECHNIQUE: Multiplanar and multiecho pulse sequences of the cervical spine, to include the craniocervical junction and  cervicothoracic junction, and the thoracic spine, were obtained without intravenous contrast. COMPARISON:  None. FINDINGS: MRI CERVICAL SPINE FINDINGS Alignment: Straightening of the cervical curvature.  Brooke Sherman Vertebrae: No fracture, evidence of discitis, or bone lesion. Cord: Normal signal and morphology. Posterior Fossa, vertebral arteries, paraspinal tissues: Negative. Disc levels: No significant disc herniation, spinal canal or neural foraminal stenosis the cervical spine. MRI THORACIC SPINE FINDINGS Alignment: Minimal dextroconvex curvature of the upper thoracic spine. Vertebrae: No fracture, evidence of discitis, or bone lesion. Cord:  Normal signal and morphology. Paraspinal and other soft tissues: Negative. Disc levels: Small posterior disc protrusion at T2-3, T3-4 and T7-8 causing minimal indentation on the thecal sac. No significant spinal canal or neural foraminal stenosis at any thoracic level. IMPRESSION: 1. No significant spinal canal or neural foraminal stenosis at any cervical or thoracic level. 2. No spinal cord signal abnormality. Electronically Signed   By: Pedro Earls M.D.   On: 01/04/2021 16:04   MR CERVICAL SPINE WO CONTRAST  Result Date: 01/04/2021 CLINICAL DATA:  Radiculitis of right cervical region. Numbness and tingling of both hands. Disc disease, degenerative, cervical. Thoracic radiculitis. EXAM: MRI CERVICAL AND THORACIC SPINE WITHOUT CONTRAST TECHNIQUE: Multiplanar and multiecho pulse sequences of the cervical spine, to include the craniocervical junction and cervicothoracic junction, and the thoracic spine, were obtained without intravenous contrast. COMPARISON:  None. FINDINGS: MRI CERVICAL SPINE FINDINGS Alignment: Straightening of the cervical curvature.  Brooke Sherman Vertebrae: No fracture, evidence of discitis, or bone lesion. Cord: Normal signal and morphology. Posterior Fossa, vertebral arteries, paraspinal tissues: Negative. Disc levels: No significant disc  herniation, spinal canal or neural foraminal stenosis the cervical spine. MRI THORACIC SPINE FINDINGS Alignment: Minimal dextroconvex curvature of the upper thoracic spine. Vertebrae: No fracture, evidence of discitis, or bone lesion. Cord:  Normal signal and morphology. Paraspinal and other soft tissues: Negative. Disc levels: Small posterior disc protrusion at T2-3, T3-4 and T7-8 causing minimal indentation on the thecal sac. No significant spinal canal or neural foraminal stenosis at any thoracic level. IMPRESSION: 1. No significant spinal canal or neural foraminal stenosis at any cervical or thoracic level. 2. No spinal cord signal abnormality. Electronically Signed   By: Pedro Earls M.D.   On: 01/04/2021 16:04    Assessment & Plan:   Brooke Sherman was seen today for annual exam and hypothyroidism.  Diagnoses and all orders for this visit:  Acquired hypothyroidism- Brooke Sherman is euthyroid. Will continue the current T4 dose. -     TSH; Future -     TSH  Encounter for general adult medical examination with abnormal findings- Exam completed, labs reviewed, vaccines are up-to-date,  cancer screenings are up-to-date, patient education was given.   I have discontinued [facility] Prenatal Vit-Fe Fumarate-FA (PRENATAL VITAMINS PO) and levonorgestrel. I am also having Brooke Sherman maintain Brooke Sherman levothyroxine and levothyroxine.  No orders of the defined types were placed in this encounter.    Follow-up: Return in about 6 months (around 01/23/2023).  Scarlette Calico, MD

## 2022-07-24 NOTE — Patient Instructions (Signed)

## 2022-07-25 ENCOUNTER — Telehealth: Payer: Self-pay | Admitting: Student in an Organized Health Care Education/Training Program

## 2022-07-25 ENCOUNTER — Other Ambulatory Visit (HOSPITAL_COMMUNITY): Payer: Self-pay

## 2022-07-25 ENCOUNTER — Other Ambulatory Visit (HOSPITAL_BASED_OUTPATIENT_CLINIC_OR_DEPARTMENT_OTHER): Payer: Self-pay

## 2022-07-25 DIAGNOSIS — U071 COVID-19: Secondary | ICD-10-CM

## 2022-07-25 MED ORDER — PAXLOVID (300/100) 20 X 150 MG & 10 X 100MG PO TBPK
3.0000 | ORAL_TABLET | Freq: Two times a day (BID) | ORAL | 0 refills | Status: DC
  Filled 2022-07-25: qty 30, 5d supply, fill #0

## 2022-07-25 NOTE — Telephone Encounter (Signed)
Paxlovid for COVID-19

## 2022-07-30 ENCOUNTER — Ambulatory Visit: Payer: 59 | Admitting: Internal Medicine

## 2022-08-07 ENCOUNTER — Ambulatory Visit: Payer: 59 | Admitting: Rheumatology

## 2022-08-14 ENCOUNTER — Telehealth: Payer: 59 | Admitting: Physician Assistant

## 2022-08-14 ENCOUNTER — Other Ambulatory Visit (HOSPITAL_COMMUNITY): Payer: Self-pay

## 2022-08-14 DIAGNOSIS — J208 Acute bronchitis due to other specified organisms: Secondary | ICD-10-CM

## 2022-08-14 DIAGNOSIS — B9689 Other specified bacterial agents as the cause of diseases classified elsewhere: Secondary | ICD-10-CM | POA: Diagnosis not present

## 2022-08-14 MED ORDER — AZITHROMYCIN 250 MG PO TABS
ORAL_TABLET | ORAL | 0 refills | Status: AC
  Filled 2022-08-14: qty 6, 5d supply, fill #0

## 2022-08-14 NOTE — Patient Instructions (Signed)
Janine Ores, thank you for joining Margaretann Loveless, PA-C for today's virtual visit.  While this provider is not your primary care provider (PCP), if your PCP is located in our provider database this encounter information will be shared with them immediately following your visit.   A [facility] MyChart account gives you access to today's visit and all your visits, tests, and labs performed at [facility] " click here if you don't have a [facility] MyChart account or go to [email or web address]  Consent: (Patient) Brooke Sherman provided verbal consent for this virtual visit at the beginning of the encounter.  Current Medications:  Current Outpatient Medications:    azithromycin (ZITHROMAX) 250 MG tablet, Take 2 tablets on day 1, then 1 tablet daily on days 2 through 5, Disp: 6 tablet, Rfl: 0   levothyroxine (SYNTHROID) 25 MCG tablet, TAKE 1 TABLET (25 MCG TOTAL) BY MOUTH EVERY OTHER DAY., Disp: 45 tablet, Rfl: 1   levothyroxine (SYNTHROID) 50 MCG tablet, TAKE 1 TABLET (50 MCG TOTAL) BY MOUTH EVERY OTHER DAY., Disp: 45 tablet, Rfl: 1   nirmatrelvir & ritonavir (PAXLOVID, 300/100,) 20 x 150 MG & 10 x 100MG  TBPK, Take 3 tablets by mouth in the morning and at bedtime., Disp: 30 tablet, Rfl: 0   Medications ordered in this encounter:  Meds ordered this encounter  Medications   azithromycin (ZITHROMAX) 250 MG tablet    Sig: Take 2 tablets on day 1, then 1 tablet daily on days 2 through 5    Dispense:  6 tablet    Refill:  0    Order Specific Question:   Supervising Provider    Answer:   Merrilee Jansky     *If you need refills on other medications prior to your next appointment, please contact your pharmacy*  Follow-Up: Call back or seek an in-person evaluation if the symptoms worsen or if the condition fails to improve as anticipated.  [facility] [phone]  Other Instructions  Acute Bronchitis,  Adult  Acute bronchitis is sudden inflammation of the main airways (bronchi) that come off the windpipe (trachea) in the lungs. The swelling causes the airways to get smaller and make more mucus than normal. This can make it hard to breathe and can cause coughing or noisy breathing (wheezing). Acute bronchitis may last several weeks. The cough may last longer. Allergies, asthma, and exposure to smoke may make the condition worse. What are the causes? This condition can be caused by germs and by substances that irritate the lungs, including: Cold and flu viruses. The most common cause of this condition is the virus that causes the common cold. Bacteria. This is less common. Breathing in substances that irritate the lungs, including: Smoke from cigarettes and other forms of tobacco. Dust and pollen. Fumes from household cleaning products, gases, or burned fuel. Indoor or outdoor air pollution. What increases the risk? The following factors may make you more likely to develop this condition: A weak body's defense system, also called the immune system. A condition that affects your lungs and breathing, such as asthma. What are the signs or symptoms? Common symptoms of this condition include: Coughing. This may bring up clear, yellow, or green mucus from your lungs (sputum). Wheezing. Runny or stuffy nose. Having too much mucus in your lungs (chest congestion). Shortness of breath. Aches and pains, including sore throat or chest. How is this diagnosed? This condition is usually diagnosed based on: Your symptoms and medical history.  A physical exam. You may also have other tests, including tests to rule out other conditions, such as pneumonia. These tests include: A test of lung function. Test of a mucus sample to look for the presence of bacteria. Tests to check the oxygen level in your blood. Blood tests. Chest X-ray. How is this treated? Most cases of acute bronchitis clear up over  time without treatment. Your health care provider may recommend: Drinking more fluids to help thin your mucus so it is easier to cough up. Taking inhaled medicine (inhaler) to improve air flow in and out of your lungs. Using a vaporizer or a humidifier. These are machines that add water to the air to help you breathe better. Taking a medicine that thins mucus and clears congestion (expectorant). Taking a medicine that prevents or stops coughing (cough suppressant). It is not common to take an antibiotic medicine for this condition. Follow these instructions at home:  Take over-the-counter and prescription medicines only as told by your health care provider. Use an inhaler, vaporizer, or humidifier as told by your health care provider. Take two teaspoons (10 mL) of honey at bedtime to lessen coughing at night. Drink enough fluid to keep your urine pale yellow. Do not use any products that contain nicotine or tobacco. These products include cigarettes, chewing tobacco, and vaping devices, such as e-cigarettes. If you need help quitting, ask your health care provider. Get plenty of rest. Return to your normal activities as told by your health care provider. Ask your health care provider what activities are safe for you. Keep all follow-up visits. This is important. How is this prevented? To lower your risk of getting this condition again: Wash your hands often with soap and water for at least 20 seconds. If soap and water are not available, use hand sanitizer. Avoid contact with people who have cold symptoms. Try not to touch your mouth, nose, or eyes with your hands. Avoid breathing in smoke or chemical fumes. Breathing smoke or chemical fumes will make your condition worse. Get the flu shot every year. Contact a health care provider if: Your symptoms do not improve after 2 weeks. You have trouble coughing up the mucus. Your cough keeps you awake at night. You have a fever. Get help right  away if you: Cough up blood. Feel pain in your chest. Have severe shortness of breath. Faint or keep feeling like you are going to faint. Have a severe headache. Have a fever or chills that get worse. These symptoms may represent a serious problem that is an emergency. Do not wait to see if the symptoms will go away. Get medical help right away. Call your local emergency services (911 in the [place] Do not drive yourself to the hospital. Summary Acute bronchitis is inflammation of the main airways (bronchi) that come off the windpipe (trachea) in the lungs. The swelling causes the airways to get smaller and make more mucus than normal. Drinking more fluids can help thin your mucus so it is easier to cough up. Take over-the-counter and prescription medicines only as told by your health care provider. Do not use any products that contain nicotine or tobacco. These products include cigarettes, chewing tobacco, and vaping devices, such as e-cigarettes. If you need help quitting, ask your health care provider. Contact a health care provider if your symptoms do not improve after 2 weeks. This information is not intended to replace advice given to you by your health care provider. Make sure you discuss any  questions you have with your health care provider. Document Revised: 12/27/2021 Document Reviewed: 01/17/2021 Elsevier Patient Education  2023 Elsevier Inc.    If you have been instructed to have an in-person evaluation today at a local Urgent Care facility, please use the link below. It will take you to a list of all of our available [facility], including address, phone number and hours of operation. Please do not delay care.  [facility]  If you or a family member do not have a primary care provider, use the link below to schedule a visit and establish care. When you choose a [facility] primary care physician or advanced practice provider, you gain a long-term partner  in health. Find a Primary Care Provider  Learn more about [facility]'s in-office and virtual care options: [facility] - Get Care Now

## 2022-08-14 NOTE — Progress Notes (Signed)
Virtual Visit Consent   Brooke Sherman, you are scheduled for a virtual visit with a [facility] provider today. Just as with appointments in the office, your consent must be obtained to participate. Your consent will be active for this visit and any virtual visit you may have with one of our providers in the next 365 days. If you have a MyChart account, a copy of this consent can be sent to you electronically.  As this is a virtual visit, video technology does not allow for your provider to perform a traditional examination. This may limit your provider's ability to fully assess your condition. If your provider identifies any concerns that need to be evaluated in person or the need to arrange testing (such as labs, EKG, etc.), we will make arrangements to do so. Although advances in technology are sophisticated, we cannot ensure that it will always work on either your end or our end. If the connection with a video visit is poor, the visit may have to be switched to a telephone visit. With either a video or telephone visit, we are not always able to ensure that we have a secure connection.  By engaging in this virtual visit, you consent to the provision of healthcare and authorize for your insurance to be billed (if applicable) for the services provided during this visit. Depending on your insurance coverage, you may receive a charge related to this service.  I need to obtain your verbal consent now. Are you willing to proceed with your visit today? Zyria Fiscus has provided verbal consent on 08/14/2022 for a virtual visit (video or telephone). Margaretann Loveless, PA-C  Date: 08/14/2022 7:54 AM  Virtual Visit via Video Note   I, Margaretann Loveless, connected with  Brooke Sherman  ([id number], 02-12-1986) on 08/14/22 at  7:45 AM EST by a video-enabled telemedicine application and verified that I am speaking with the correct person using two  identifiers.  Location: Patient: Virtual Visit Location Patient: Home Provider: Virtual Visit Location Provider: Home Office   I discussed the limitations of evaluation and management by telemedicine and the availability of in person appointments. The patient expressed understanding and agreed to proceed.    History of Present Illness: Brooke Sherman is a 36 y.o. who identifies as a female who was assigned female at birth, and is being seen today for cough following Covid 19.  HPI: Cough This is a new problem. The current episode started 1 to 4 weeks ago (3 weeks ago following Covid 19; did take Paxlovid, symptoms improved for a week then worsened). The problem has been unchanged. The problem occurs constantly. The cough is Non-productive. Associated symptoms include nasal congestion (last week; but now improved). Pertinent negatives include no chills, fever, myalgias, postnasal drip, rhinorrhea, sore throat, shortness of breath or wheezing. Associated symptoms comments: Fatigue, reduced appetite. The symptoms are aggravated by lying down. Treatments tried: ibuprofen and tylenol, flonase. The treatment provided no relief.     Problems:  Patient Active Problem List   Diagnosis Date Noted   Encounter for general adult medical examination with abnormal findings 07/24/2022   Carpal tunnel syndrome of right wrist 06/29/2021   Carpal tunnel syndrome of left wrist 04/27/2021   Protrusion of thoracic intervertebral disc 01/05/2021   Thoracic radiculitis 12/13/2020   Hypothyroidism 05/14/2020    Allergies: No Known Allergies Medications:  Current Outpatient Medications:    azithromycin (ZITHROMAX) 250 MG tablet, Take 2 tablets on day 1, then 1 tablet  daily on days 2 through 5, Disp: 6 tablet, Rfl: 0   levothyroxine (SYNTHROID) 25 MCG tablet, TAKE 1 TABLET (25 MCG TOTAL) BY MOUTH EVERY OTHER DAY., Disp: 45 tablet, Rfl: 1   levothyroxine (SYNTHROID) 50 MCG tablet, TAKE 1 TABLET (50 MCG  TOTAL) BY MOUTH EVERY OTHER DAY., Disp: 45 tablet, Rfl: 1   nirmatrelvir & ritonavir (PAXLOVID, 300/100,) 20 x 150 MG & 10 x 100MG  TBPK, Take 3 tablets by mouth in the morning and at bedtime., Disp: 30 tablet, Rfl: 0   Observations/Objective: Patient is well-developed, well-nourished in no acute distress.  Resting comfortably at home.  Head is normocephalic, atraumatic.  No labored breathing.  Speech is clear and coherent with logical content.  Patient is alert and oriented at baseline.    Assessment and Plan: 1. Acute bacterial bronchitis - azithromycin (ZITHROMAX) 250 MG tablet; Take 2 tablets on day 1, then 1 tablet daily on days 2 through 5  Dispense: 6 tablet; Refill: 0  - Worsening over a week despite OTC medications - Will treat with Z-pack  - Can add Mucinex, Mucinex DM or Delsym - Push fluids.  - Rest.  - Steam and humidifier can help - Seek in person evaluation if worsening or symptoms fail to improve    Follow Up Instructions: I discussed the assessment and treatment plan with the patient. The patient was provided an opportunity to ask questions and all were answered. The patient agreed with the plan and demonstrated an understanding of the instructions.  A copy of instructions were sent to the patient via MyChart unless otherwise noted below.    The patient was advised to call back or seek an in-person evaluation if the symptoms worsen or if the condition fails to improve as anticipated.  Time:  I spent 12 minutes with the patient via telehealth technology discussing the above problems/concerns.    , PA-C

## 2022-09-06 NOTE — Progress Notes (Signed)
Office Visit Note  Patient: Brooke Sherman             Date of Birth: Sep 11, 1986           MRN: [id number]             PCP: Etta Grandchild, MD Referring: Etta Grandchild, MD Visit Date: 09/20/2022 Occupation: @GUAROCC @  Subjective:  Positive ANA and Raynauds  History of Present Illness: Brooke Sherman is a 36 y.o. female with history of positive ANA and Raynaud's.  She states she is approximately [redacted] weeks pregnant.  She has not seen her OB yet.  Her home pregnancy test was positive.  She continues to have Raynauds phenomenon especially during the winter months.  She also has bilateral carpal tunnel syndrome prominent in her right hand.  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  She states that she developed some left knee joint discomfort after running half marathon in October.  She has not noticed any joint swelling.  She denies any history of oral ulcers, nasal ulcers, malar rash, photosensitivity, lymphadenopathy or sicca symptoms.  She has off-and-on discomfort in her left shoulder.  She relates the discomfort from using crutches in the past.  Activities of Daily Living:  Patient reports morning stiffness for 0 minutes.   Patient Denies nocturnal pain.  Difficulty dressing/grooming: Denies Difficulty climbing stairs: Denies Difficulty getting out of chair: Denies Difficulty using hands for taps, buttons, cutlery, and/or writing: Denies  Review of Systems  Constitutional:  Positive for fatigue.  HENT:  Negative for mouth sores and mouth dryness.   Eyes:  Negative for dryness.  Respiratory:  Negative for shortness of breath.   Cardiovascular:  Negative for chest pain and palpitations.  Gastrointestinal:  Negative for blood in stool, constipation and diarrhea.  Endocrine: Positive for increased urination.  Genitourinary:  Negative for involuntary urination.  Musculoskeletal:  Positive for joint pain, joint pain, myalgias and myalgias. Negative for  gait problem, joint swelling, muscle weakness, morning stiffness and muscle tenderness.  Skin:  Positive for color change. Negative for rash, hair loss and sensitivity to sunlight.  Allergic/Immunologic: Negative for susceptible to infections.  Neurological:  Negative for dizziness and headaches.  Hematological:  Negative for swollen glands.  Psychiatric/Behavioral:  Negative for depressed mood and sleep disturbance. The patient is not nervous/anxious.     PMFS History:  Patient Active Problem List   Diagnosis Date Noted   Encounter for general adult medical examination with abnormal findings 07/24/2022   Carpal tunnel syndrome of right wrist 06/29/2021   Carpal tunnel syndrome of left wrist 04/27/2021   Protrusion of thoracic intervertebral disc 01/05/2021   Thoracic radiculitis 12/13/2020   Hypothyroidism 05/14/2020    Past Medical History:  Diagnosis Date   Asthma    Carpal tunnel syndrome    Hypothyroid    Raynaud's disease     Family History  Problem Relation Age of Onset   Cervical cancer Mother    Past Surgical History:  Procedure Laterality Date   ANTERIOR CRUCIATE LIGAMENT REPAIR Left 2019   WISDOM TOOTH EXTRACTION     Social History   Social History Narrative   Not on file   Immunization History  Administered Date(s) Administered   Influenza, Quadrivalent, Recombinant, Inj, Pf 07/02/2022   Influenza,inj,Quad PF,6+ Mos 06/12/2020   Influenza-Unspecified 07/11/2021   PFIZER Comirnaty(Gray Top)Covid-19 Tri-Sucrose Vaccine 07/01/2022   Pfizer Covid-19 Vaccine Bivalent Booster 60yrs & up 07/11/2021   Tdap 06/12/2020  Unspecified SARS-COV-2 Vaccination 12/12/2019, 01/09/2020, 09/06/2020     Objective: Vital Signs: BP 113/76 (BP Location: Left Arm, Patient Position: Sitting, Cuff Size: Normal)   Pulse (!) 58   Resp 14   Ht 5\' 5"  (1.651 m)   Wt 153 lb 9.6 oz (69.7 kg)   LMP 08/08/2022 (Exact Date)   BMI 25.56 kg/m    Physical Exam Vitals and nursing  note reviewed.  Constitutional:      Appearance: She is well-developed.  HENT:     Head: Normocephalic and atraumatic.  Eyes:     Conjunctiva/sclera: Conjunctivae normal.  Cardiovascular:     Rate and Rhythm: Normal rate and regular rhythm.     Heart sounds: Normal heart sounds.  Pulmonary:     Effort: Pulmonary effort is normal.     Breath sounds: Normal breath sounds.  Abdominal:     General: Bowel sounds are normal.     Palpations: Abdomen is soft.  Musculoskeletal:     Cervical back: Normal range of motion.  Lymphadenopathy:     Cervical: No cervical adenopathy.  Skin:    General: Skin is warm and dry.     Capillary Refill: Capillary refill takes less than 2 seconds.     Comments: No sclerodactyly or Telangiectasia were noted.  No nailbed capillary changes were noted.  Neurological:     Mental Status: She is alert and oriented to person, place, and time.  Psychiatric:        Behavior: Behavior normal.      Musculoskeletal Exam: Cervical, thoracic and lumbar spine were in good range of motion.  Shoulder joints, elbow joints, wrist joints, MCPs PIPs and DIPs with good range of motion.  She had bilateral PIP and DIP thickening consistent with osteoarthritis.  No synovitis was noted.  Hip joints, knee joints were in good range of motion without any warmth swelling or effusion.  There was no tenderness over ankles or MTPs.  CDAI Exam: CDAI Score: -- Patient Global: --; Provider Global: -- Swollen: --; Tender: -- Joint Exam 09/20/2022   No joint exam has been documented for this visit   There is currently no information documented on the homunculus. Go to the Rheumatology activity and complete the homunculus joint exam.  Investigation: No additional findings.  Imaging: No results found.  Recent Labs: Lab Results  Component Value Date   WBC 3.7 (L) 02/20/2022   HGB 13.6 02/20/2022   PLT 220 02/20/2022   NA 139 02/20/2022   K 4.3 02/20/2022   CL 105 02/20/2022    CO2 26 02/20/2022   GLUCOSE 80 02/20/2022   BUN 15 02/20/2022   CREATININE 0.64 02/20/2022   BILITOT 0.7 02/20/2022   AST 18 02/20/2022   ALT 12 02/20/2022   PROT 7.1 02/20/2022   CALCIUM 9.7 02/20/2022    Speciality Comments: No specialty comments available.  Procedures:  No procedures performed Allergies: Patient has no known allergies.   Assessment / Plan:     Visit Diagnoses: Pain in right wrist -she has chronic discomfort in her wrist joint which she relates to activities.  No synovitis was noted.  She had MRI by Dr. 02/22/2022 of her wrist joint which showed mild degenerative changes and interstitial fraying of the ulnar attachment of the TFCC.  Primary osteoarthritis of both hands-bilateral PIP and DIP thickening consistent with osteoarthritis was noted.  Joint protection muscle strengthening was discussed.  Bilateral carpal tunnel syndrome-she has mild carpal tunnel syndrome symptoms.  Use of carpal tunnel  braces was discussed.  Chronic left shoulder pain-she had left shoulder joint pain when she was using crutches in the past.  She states the pain intermittently comes.  Shoulder joint exercises were discussed.  Chronic pain of left knee-she has crepitus in her left knee joint without any warmth swelling or effusion.  Lower extremity muscle strength As we discussed.  Raynaud's syndrome without gangrene-she continues to have Raynauds symptoms.  Warm clothing and keeping core temperature warm was discussed.  I would avoid any medications while she is pregnant.  Positive ANA (antinuclear antibody) - Feb 20, 2022 WBC 3.7, ANA 1: 40NS, ENA negative, C3 94, C4 11.  Patient denies any history of sicca symptoms, oral ulcers, nasal ulcers, malar rash, photosensitivity, inflammatory artery arteries or lymphadenopathy.  I advised her to contact me if she develops any new symptoms.  Thoracic radiculitis-she has intermittent discomfort.  Protrusion of thoracic intervertebral disc -  diagnosed in [place].  Less than [redacted] weeks gestation of pregnancy  History of hypothyroidism  Orders: No orders of the defined types were placed in this encounter.  No orders of the defined types were placed in this encounter.    Follow-Up Instructions: Return in about 6 months (around 03/22/2023) for +ANA.   Pollyann Savoy, MD  Note - This record has been created using Animal nutritionist.  Chart creation errors have been sought, but may not always  have been located. Such creation errors do not reflect on  the standard of medical care.

## 2022-09-09 ENCOUNTER — Ambulatory Visit (INDEPENDENT_AMBULATORY_CARE_PROVIDER_SITE_OTHER): Payer: 59

## 2022-09-09 DIAGNOSIS — Z3202 Encounter for pregnancy test, result negative: Secondary | ICD-10-CM | POA: Diagnosis not present

## 2022-09-09 LAB — POCT URINE PREGNANCY: Preg Test, Ur: NEGATIVE

## 2022-09-09 NOTE — Progress Notes (Signed)
Brooke Sherman presents today for UPT. She has no unusual complaints.  LMP: 08/08/2022    OBJECTIVE: Appears well, in no apparent distress.  OB History     Gravida  1   Para  1   Term  1   Preterm      AB      Living  1      SAB      IAB      Ectopic      Multiple  0   Live Births  1          Home UPT Result: POSITIVE In-Office UPT result: NEGATIVE  I have reviewed the patient's medical, obstetrical, social, and family histories, and medications.   ASSESSMENT: Negative Pregnancy test  PLAN Prenatal care to be completed at: NA Pt will RTO in 2 weeks for another UPT.

## 2022-09-09 NOTE — Progress Notes (Signed)
Patient was assessed and managed by nursing staff during this encounter. I have reviewed the chart and agree with the documentation and plan. I have also made any necessary editorial changes.  Catalina Antigua, MD 09/09/2022 3:49 PM

## 2022-09-20 ENCOUNTER — Ambulatory Visit: Payer: 59 | Attending: Rheumatology | Admitting: Rheumatology

## 2022-09-20 ENCOUNTER — Encounter: Payer: Self-pay | Admitting: Rheumatology

## 2022-09-20 VITALS — BP 113/76 | HR 58 | Resp 14 | Ht 65.0 in | Wt 153.6 lb

## 2022-09-20 DIAGNOSIS — R768 Other specified abnormal immunological findings in serum: Secondary | ICD-10-CM | POA: Diagnosis not present

## 2022-09-20 DIAGNOSIS — M25531 Pain in right wrist: Secondary | ICD-10-CM

## 2022-09-20 DIAGNOSIS — M25562 Pain in left knee: Secondary | ICD-10-CM

## 2022-09-20 DIAGNOSIS — M5124 Other intervertebral disc displacement, thoracic region: Secondary | ICD-10-CM | POA: Diagnosis not present

## 2022-09-20 DIAGNOSIS — M19041 Primary osteoarthritis, right hand: Secondary | ICD-10-CM

## 2022-09-20 DIAGNOSIS — M25512 Pain in left shoulder: Secondary | ICD-10-CM

## 2022-09-20 DIAGNOSIS — G5603 Carpal tunnel syndrome, bilateral upper limbs: Secondary | ICD-10-CM

## 2022-09-20 DIAGNOSIS — G8929 Other chronic pain: Secondary | ICD-10-CM

## 2022-09-20 DIAGNOSIS — I7300 Raynaud's syndrome without gangrene: Secondary | ICD-10-CM

## 2022-09-20 DIAGNOSIS — M19042 Primary osteoarthritis, left hand: Secondary | ICD-10-CM

## 2022-09-20 DIAGNOSIS — M5414 Radiculopathy, thoracic region: Secondary | ICD-10-CM

## 2022-09-20 DIAGNOSIS — Z975 Presence of (intrauterine) contraceptive device: Secondary | ICD-10-CM

## 2022-09-20 DIAGNOSIS — Z3A01 Less than 8 weeks gestation of pregnancy: Secondary | ICD-10-CM

## 2022-09-20 DIAGNOSIS — Z8639 Personal history of other endocrine, nutritional and metabolic disease: Secondary | ICD-10-CM

## 2022-09-25 ENCOUNTER — Ambulatory Visit (INDEPENDENT_AMBULATORY_CARE_PROVIDER_SITE_OTHER): Payer: 59 | Admitting: General Practice

## 2022-09-25 VITALS — BP 126/74 | HR 64 | Ht 65.0 in | Wt 153.1 lb

## 2022-09-25 DIAGNOSIS — Z3201 Encounter for pregnancy test, result positive: Secondary | ICD-10-CM | POA: Diagnosis not present

## 2022-09-25 DIAGNOSIS — N912 Amenorrhea, unspecified: Secondary | ICD-10-CM

## 2022-09-25 DIAGNOSIS — Z3480 Encounter for supervision of other normal pregnancy, unspecified trimester: Secondary | ICD-10-CM | POA: Insufficient documentation

## 2022-09-25 LAB — POCT URINE PREGNANCY: Preg Test, Ur: POSITIVE — AB

## 2022-09-25 NOTE — Progress Notes (Signed)
Brooke Sherman presents today for UPT. She has no unusual complaints. LMP:08-08-22    OBJECTIVE: Appears well, in no apparent distress.  OB History     Gravida  2   Para  1   Term  1   Preterm      AB      Living  1      SAB      IAB      Ectopic      Multiple  0   Live Births  1          Home UPT Result: Positive In-Office UPT result: Positive I have reviewed the patient's medical, obstetrical, social, and family histories, and medications.   ASSESSMENT: Positive pregnancy test  PLAN Prenatal care [facility] be completed at: [facility]

## 2022-09-30 NOTE — L&D Delivery Note (Addendum)
OB/GYN Faculty Practice Delivery Note  Brooke Sherman is a 37 y.o. G2P1001 s/p SVD at [redacted]w[redacted]d. She was admitted for .   ROM: 0h 67m with thick particulate meconium fluid GBS Status: Negative/-- (07/24 1414) Maximum Maternal Temperature: 97.26F   Labor Progress: Initial SVE: 2/60/-3. She then progressed to complete.   Delivery Date/Time: 05/22/23 0441 Delivery: Called to room and patient was complete and pushing. Head delivered LOA. A loose nuchal cord was reduced.Shoulder and body delivered in usual fashion. Infant with spontaneous cry, placed on mother's abdomen, dried and stimulated. Cord clamped x 2 after 1-minute delay, and cut by father of baby. Cord blood drawn. Gentle traction was placed on the umbilical cord and cord detached from placenta. The placenta was manually extracted and inspected and found to be intact. Fundus firm with massage and Pitocin. Labia, perineum, vagina, and cervix inspected inspected with a first degree sulcal and right labial laceration were repaired with 3-0 Vicryl.  Baby Weight: pending  Placenta: Sent to pathology Complications: None Lacerations: A right labial and a first degree sulcal laceration were repaired EBL: 622 mL Analgesia: Epidural   Infant:  APGAR (1 MIN): 9  APGAR (5 MINS): 9   Sundra Aland, MD OB Fellow, Faculty Practice [facility], [facility]

## 2022-10-08 ENCOUNTER — Ambulatory Visit: Payer: 59 | Admitting: *Deleted

## 2022-10-08 ENCOUNTER — Other Ambulatory Visit (HOSPITAL_COMMUNITY): Payer: Self-pay

## 2022-10-08 ENCOUNTER — Ambulatory Visit (INDEPENDENT_AMBULATORY_CARE_PROVIDER_SITE_OTHER): Payer: 59

## 2022-10-08 VITALS — BP 114/70 | HR 67 | Ht 65.0 in | Wt 153.5 lb

## 2022-10-08 DIAGNOSIS — Z3A01 Less than 8 weeks gestation of pregnancy: Secondary | ICD-10-CM

## 2022-10-08 DIAGNOSIS — O3680X0 Pregnancy with inconclusive fetal viability, not applicable or unspecified: Secondary | ICD-10-CM

## 2022-10-08 DIAGNOSIS — E039 Hypothyroidism, unspecified: Secondary | ICD-10-CM

## 2022-10-08 DIAGNOSIS — Z3480 Encounter for supervision of other normal pregnancy, unspecified trimester: Secondary | ICD-10-CM

## 2022-10-08 MED ORDER — BLOOD PRESSURE KIT DEVI
1.0000 | 0 refills | Status: DC
  Filled 2022-10-08: qty 1, 30d supply, fill #0

## 2022-10-08 NOTE — Progress Notes (Signed)
New OB Intake  I connected withNAME@  on 10/08/22 at  8:15 AM EST by In Person Visit and verified that I am speaking with the correct person using two identifiers. Nurse is located at [facility] and pt is located at [place].  I discussed the limitations, risks, security and privacy concerns of performing an evaluation and management service by telephone and the availability of in person appointments. I also discussed with the patient that there may be a patient responsible charge related to this service. The patient expressed understanding and agreed to proceed.  I explained I am completing New OB Intake today. We discussed EDD of 05/21/23 that is based on [place] on 10/08/22 at  [redacted]w[redacted]d. Pt is G2/P1. I reviewed her allergies, medications, Medical/Surgical/OB history, and appropriate screenings. I informed her of [facility] services. [facility] information placed in AVS. Based on history, this is a low risk pregnancy.  Patient Active Problem List   Diagnosis Date Noted   Supervision of other normal pregnancy, antepartum 09/25/2022   Encounter for general adult medical examination with abnormal findings 07/24/2022   Carpal tunnel syndrome of right wrist 06/29/2021   Carpal tunnel syndrome of left wrist 04/27/2021   Protrusion of thoracic intervertebral disc 01/05/2021   Thoracic radiculitis 12/13/2020   Hypothyroidism 05/14/2020    Concerns addressed today  Delivery Plans Plans to deliver at [facility]. Patient given information for [facility] Healthy Baby website for more information about Women's and [place]. Patient is interested in water birth. Offered upcoming OB visit with CNM to discuss further.  MyChart/Babyscripts MyChart access verified. I explained pt will have some visits in office and some virtually. Babyscripts instructions given and order placed. Patient verifies receipt of registration text/e-mail. Account successfully created and app downloaded.  Blood Pressure Cuff/Weight Scale Patient has  private insurance; instructed to purchase blood pressure cuff and bring to first prenatal appt. Explained after first prenatal appt pt will check weekly and document in 69. Patient does not have weight scale; patient may purchase if they desire to track weight weekly in Babyscripts.  Anatomy US Explained first scheduled [place] will be around 19 weeks. Anatomy US scheduled for 19 wks at [facility]. Pt notified to arrive at TBD.  Labs Discussed Johnsie Cancel genetic screening with patient. Would like both Panorama and Horizon drawn at new OB visit. Routine prenatal labs needed.  COVID Vaccine Patient has had COVID vaccine.   Social Determinants of Health Food Insecurity: Patient denies food insecurity. WIC Referral: Patient is not interested in referral to [facility].  Transportation: Patient denies transportation needs. Childcare: Discussed no children allowed at ultrasound appointments. Offered childcare services; patient declines childcare services at this time.  First visit review I reviewed new OB appt with patient. I explained they will have a provider visit that includes Pap, GC/CC, Genetic screening labs, OB urine. Explained pt will be seen by Dr. Roselie Awkward at first visit; encounter routed to appropriate provider. Explained that patient will be seen by pregnancy navigator following visit with provider.   Penny Pia, RN 10/08/2022  8:15 AM

## 2022-10-09 LAB — HCV INTERPRETATION

## 2022-10-09 LAB — T4, FREE: Free T4: 1.14 ng/dL (ref 0.82–1.77)

## 2022-10-09 LAB — CBC/D/PLT+RPR+RH+ABO+RUBIGG...
Antibody Screen: NEGATIVE
Basophils Absolute: 0 10*3/uL (ref 0.0–0.2)
Basos: 1 %
EOS (ABSOLUTE): 0.1 10*3/uL (ref 0.0–0.4)
Eos: 1 %
HCV Ab: NONREACTIVE
HIV Screen 4th Generation wRfx: NONREACTIVE
Hematocrit: 37 % (ref 34.0–46.6)
Hemoglobin: 12.4 g/dL (ref 11.1–15.9)
Hepatitis B Surface Ag: NEGATIVE
Immature Grans (Abs): 0 10*3/uL (ref 0.0–0.1)
Immature Granulocytes: 0 %
Lymphocytes Absolute: 1.1 10*3/uL (ref 0.7–3.1)
Lymphs: 21 %
MCH: 27.7 pg (ref 26.6–33.0)
MCHC: 33.5 g/dL (ref 31.5–35.7)
MCV: 83 fL (ref 79–97)
Monocytes Absolute: 0.4 10*3/uL (ref 0.1–0.9)
Monocytes: 7 %
Neutrophils Absolute: 3.6 10*3/uL (ref 1.4–7.0)
Neutrophils: 70 %
Platelets: 251 10*3/uL (ref 150–450)
RBC: 4.48 x10E6/uL (ref 3.77–5.28)
RDW: 12.5 % (ref 11.7–15.4)
RPR Ser Ql: NONREACTIVE
Rh Factor: POSITIVE
Rubella Antibodies, IGG: 8.02 index (ref >0.99)
WBC: 5.2 10*3/uL (ref 3.4–10.8)

## 2022-10-09 LAB — TSH: TSH: 3.33 u[IU]/mL (ref 0.450–4.500)

## 2022-10-18 ENCOUNTER — Telehealth (INDEPENDENT_AMBULATORY_CARE_PROVIDER_SITE_OTHER): Payer: 59 | Admitting: Family Medicine

## 2022-10-18 DIAGNOSIS — R519 Headache, unspecified: Secondary | ICD-10-CM

## 2022-10-18 DIAGNOSIS — J029 Acute pharyngitis, unspecified: Secondary | ICD-10-CM

## 2022-10-18 DIAGNOSIS — Z3A10 10 weeks gestation of pregnancy: Secondary | ICD-10-CM | POA: Diagnosis not present

## 2022-10-18 DIAGNOSIS — J3489 Other specified disorders of nose and nasal sinuses: Secondary | ICD-10-CM | POA: Diagnosis not present

## 2022-10-18 LAB — POC COVID19 BINAXNOW: SARS Coronavirus 2 Ag: NEGATIVE

## 2022-10-18 LAB — POCT INFLUENZA A/B
Influenza A, POC: NEGATIVE
Influenza B, POC: NEGATIVE

## 2022-10-18 NOTE — Progress Notes (Signed)
MyChart Video Visit    Virtual Visit via Video Note   This visit type was conducted due to national recommendations for restrictions regarding the COVID-19 Pandemic (e.g. social distancing) in an effort to limit this patient's exposure and mitigate transmission in our community. This patient is at least at moderate risk for complications without adequate follow up. This format is felt to be most appropriate for this patient at this time. Physical exam was limited by quality of the video and audio technology used for the visit. CMA was able to get the patient set up on a video visit.  Patient location: Home. Patient and provider in visit Provider location: Office  I discussed the limitations of evaluation and management by telemedicine and the availability of in person appointments. The patient expressed understanding and agreed to proceed.  Visit Date: 10/18/2022  Today's healthcare provider: Harland Dingwall, NP-C     Subjective:    Patient ID: Brooke Sherman, female    DOB: [birth year], 37 y.o.   MRN: [id number]  Chief Complaint  Patient presents with   Headache    Last 2 days   Sore Throat    Started yesterday   Nasal Congestion    And runny rose, full feeling in ears for a week    HPI  Rhinorrhea x 1 week, headache and sore throat started last night.  No fever, chills, body aches, shortness of breath, abdominal pain, N/V/D.   She is [redacted] weeks pregnant. OB/GYN is Dr. Elly Modena   She took Tylenol this morning.   Would like Covid and Flu testing.    Past Medical History:  Diagnosis Date   Asthma    Childhood   Carpal tunnel syndrome    Hypothyroid    Raynaud's disease     Past Surgical History:  Procedure Laterality Date   ANTERIOR CRUCIATE LIGAMENT REPAIR Left 2019   WISDOM TOOTH EXTRACTION      Family History  Problem Relation Age of Onset   Cervical cancer Mother     Social History   Socioeconomic History   Marital status: Married     Spouse name: Roderic Palau   Number of children: Not on file   Years of education: Not on file   Highest education level: Not on file  Occupational History   Not on file  Tobacco Use   Smoking status: Never    Passive exposure: Never   Smokeless tobacco: Never  Vaping Use   Vaping Use: Never used  Substance and Sexual Activity   Alcohol use: Not Currently   Drug use: Not Currently   Sexual activity: Yes    Partners: Male    Birth control/protection: None  Other Topics Concern   Not on file  Social History Narrative   Not on file   Social Determinants of Health   Financial Resource Strain: Not on file  Food Insecurity: Not on file  Transportation Needs: Not on file  Physical Activity: Not on file  Stress: Not on file  Social Connections: Not on file  Intimate Partner Violence: Not on file    Outpatient Medications Prior to Visit  Medication Sig Dispense Refill   Blood Pressure Monitoring (BLOOD PRESSURE KIT) DEVI Use as directed once a week. 1 each 0   levothyroxine (SYNTHROID) 25 MCG tablet TAKE 1 TABLET (25 MCG TOTAL) BY MOUTH EVERY OTHER DAY. 45 tablet 1   levothyroxine (SYNTHROID) 50 MCG tablet TAKE 1 TABLET (50 MCG TOTAL) BY MOUTH EVERY OTHER DAY. [place]  tablet 1   Prenatal Vit-Fe Fumarate-FA (PRENATAL PO) Take by mouth daily.     No facility-administered medications prior to visit.    No Known Allergies  ROS     Objective:    Physical Exam  LMP 08/08/2022 (Exact Date)  Wt Readings from Last 3 Encounters:  10/08/22 153 lb 8 oz (69.6 kg)  09/25/22 153 lb 1.6 oz (69.4 kg)  09/20/22 153 lb 9.6 oz (69.7 kg)   Alert and oriented and in no acute distress.  Respirations unlabored.  Speaking in complete sentences without difficulty.  Normal mood.     Assessment & Plan:   Problem List Items Addressed This Visit   None Visit Diagnoses     Acute pharyngitis, unspecified etiology    -  Primary   Relevant Orders   POC COVID-19 (Completed)   POCT Influenza A/B  (Completed)   Acute nonintractable headache, unspecified headache type       Relevant Orders   POC COVID-19 (Completed)   POCT Influenza A/B (Completed)   Rhinorrhea       Relevant Orders   POC COVID-19 (Completed)   POCT Influenza A/B (Completed)   [redacted] weeks gestation of pregnancy          Symptoms of viral illness, worsening.  She will come to the office for COVID and flu testing and we will follow-up with her results.  Discussed symptomatic treatment.  She will also check with her OB/GYN office in regards to symptomatic management during pregnancy.  Follow-up with COVID and flu results.  Discussed that if she is getting worse over the weekend that she should be seen in an urgent care setting.  Negative for Covid and influenza today.   I am having Juliann Mule maintain her levothyroxine, levothyroxine, Prenatal Vit-Fe Fumarate-FA (PRENATAL PO), and Blood Pressure Kit.  No orders of the defined types were placed in this encounter.   I discussed the assessment and treatment plan with the patient. The patient was provided an opportunity to ask questions and all were answered. The patient agreed with the plan and demonstrated an understanding of the instructions.   The patient was advised to call back or seek an in-person evaluation if the symptoms worsen or if the condition fails to improve as anticipated.  I provided 13 minutes of face-to-face time during this encounter.   Harland Dingwall, NP-C [facility] [place] [phone] (phone) [phone] (fax)  [place]

## 2022-10-21 ENCOUNTER — Encounter: Payer: Self-pay | Admitting: Family Medicine

## 2022-10-22 ENCOUNTER — Telehealth (INDEPENDENT_AMBULATORY_CARE_PROVIDER_SITE_OTHER): Payer: 59 | Admitting: Family Medicine

## 2022-10-22 ENCOUNTER — Encounter: Payer: Self-pay | Admitting: Family Medicine

## 2022-10-22 DIAGNOSIS — R0981 Nasal congestion: Secondary | ICD-10-CM | POA: Diagnosis not present

## 2022-10-22 DIAGNOSIS — J3489 Other specified disorders of nose and nasal sinuses: Secondary | ICD-10-CM | POA: Diagnosis not present

## 2022-10-22 DIAGNOSIS — H938X2 Other specified disorders of left ear: Secondary | ICD-10-CM | POA: Diagnosis not present

## 2022-10-22 NOTE — Telephone Encounter (Signed)
Called and scheduled VV 

## 2022-10-22 NOTE — Progress Notes (Signed)
MyChart Video Visit    Virtual Visit via Video Note   This visit type was conducted due to national recommendations for restrictions regarding the COVID-19 Pandemic (e.g. social distancing) in an effort to limit this patient's exposure and mitigate transmission in our community. This patient is at least at moderate risk for complications without adequate follow up. This format is felt to be most appropriate for this patient at this time. Physical exam was limited by quality of the video and audio technology used for the visit. CMA was able to get the patient set up on a video visit.  Patient location: Home. Patient and provider in visit Provider location: Office  I discussed the limitations of evaluation and management by telemedicine and the availability of in person appointments. The patient expressed understanding and agreed to proceed.  Visit Date: 10/22/2022  Today's healthcare provider: Harland Dingwall, NP-C     Subjective:    Patient ID: Brooke Sherman, female    DOB: Nov 05, 1985, 37 y.o.   MRN: [id number]  Chief Complaint  Patient presents with   Nasal Congestion    Pressure behind eyes and fullness in ears, almost feels like a toothache    HPI  C/o a 6-7 day hx of frontal headache, sinus pressure, facial pain, ST, post nasal drainage, left ear fullness.  She is [redacted] weeks pregnant. Negative Covid and flu tests 4 days ago. She has not tested since.   Using Flonase and Tylenol.   No fever, chills, rhinorrhea, dizziness, chest pain, palpitations, shortness of breath, significant cough, abdominal pain, N/V/D    Past Medical History:  Diagnosis Date   Asthma    Childhood   Carpal tunnel syndrome    Hypothyroid    Raynaud's disease     Past Surgical History:  Procedure Laterality Date   ANTERIOR CRUCIATE LIGAMENT REPAIR Left 2019   WISDOM TOOTH EXTRACTION      Family History  Problem Relation Age of Onset   Cervical cancer Mother     Social  History   Socioeconomic History   Marital status: Married    Spouse name: Roderic Palau   Number of children: Not on file   Years of education: Not on file   Highest education level: Not on file  Occupational History   Not on file  Tobacco Use   Smoking status: Never    Passive exposure: Never   Smokeless tobacco: Never  Vaping Use   Vaping Use: Never used  Substance and Sexual Activity   Alcohol use: Not Currently   Drug use: Not Currently   Sexual activity: Yes    Partners: Male    Birth control/protection: None  Other Topics Concern   Not on file  Social History Narrative   Not on file   Social Determinants of Health   Financial Resource Strain: Not on file  Food Insecurity: Not on file  Transportation Needs: Not on file  Physical Activity: Not on file  Stress: Not on file  Social Connections: Not on file  Intimate Partner Violence: Not on file    Outpatient Medications Prior to Visit  Medication Sig Dispense Refill   Blood Pressure Monitoring (BLOOD PRESSURE KIT) DEVI Use as directed once a week. 1 each 0   levothyroxine (SYNTHROID) 25 MCG tablet TAKE 1 TABLET (25 MCG TOTAL) BY MOUTH EVERY OTHER DAY. 45 tablet 1   levothyroxine (SYNTHROID) 50 MCG tablet TAKE 1 TABLET (50 MCG TOTAL) BY MOUTH EVERY OTHER DAY. 45 tablet 1  Prenatal Vit-Fe Fumarate-FA (PRENATAL PO) Take by mouth daily.     No facility-administered medications prior to visit.    No Known Allergies  ROS     Objective:    Physical Exam  LMP 08/08/2022 (Exact Date)  Wt Readings from Last 3 Encounters:  10/08/22 153 lb 8 oz (69.6 kg)  09/25/22 153 lb 1.6 oz (69.4 kg)  09/20/22 153 lb 9.6 oz (69.7 kg)   Alert and oriented and in no acute distress.  Respirations unlabored.  Speaking in complete sentences without difficulty.    Assessment & Plan:   Problem List Items Addressed This Visit   None Visit Diagnoses     Nasal congestion    -  Primary   Sinus pain       Ear fullness, left           Discussed in depth symptomatic treatment. Discussed option of antibiotic therapy but recommend holding off until she fails OTC treatments safe for pregnancy. She may reach out to her OB/GYN.  She will follow up if worsening such as developing fever, worsening pain or any new symptoms.   I am having Brooke Sherman maintain her levothyroxine, levothyroxine, Prenatal Vit-Fe Fumarate-FA (PRENATAL PO), and Blood Pressure Kit.  No orders of the defined types were placed in this encounter.   I discussed the assessment and treatment plan with the patient. The patient was provided an opportunity to ask questions and all were answered. The patient agreed with the plan and demonstrated an understanding of the instructions.   The patient was advised to call back or seek an in-person evaluation if the symptoms worsen or if the condition fails to improve as anticipated.  I provided 8 minutes of face-to-face time during this encounter.   Harland Dingwall, NP-C [facility] [place] [phone] (phone) [phone] (fax)  [place]

## 2022-10-22 NOTE — Telephone Encounter (Signed)
Would you like pt to schedule another ov for testing?

## 2022-10-25 ENCOUNTER — Encounter: Payer: Self-pay | Admitting: Obstetrics and Gynecology

## 2022-10-29 ENCOUNTER — Ambulatory Visit: Payer: 59 | Admitting: Family Medicine

## 2022-10-29 ENCOUNTER — Encounter: Payer: Self-pay | Admitting: Family Medicine

## 2022-10-29 VITALS — BP 106/72 | HR 55 | Temp 97.8°F | Ht 65.0 in | Wt 156.0 lb

## 2022-10-29 DIAGNOSIS — J029 Acute pharyngitis, unspecified: Secondary | ICD-10-CM | POA: Diagnosis not present

## 2022-10-29 DIAGNOSIS — R0981 Nasal congestion: Secondary | ICD-10-CM | POA: Diagnosis not present

## 2022-10-29 NOTE — Patient Instructions (Signed)
Continue treating your symptoms.   Salt water gargles work well for sore throat and Tylenol as needed.   Let us know if you have any new or worsening symptoms.

## 2022-10-29 NOTE — Progress Notes (Signed)
Subjective:  Brooke Sherman is a 37 y.o. female who presents for ST and nasal congestion x 4 days. Symptoms are improving. Symptoms have been intermittent for one week. She is pregnant.   Denies fever, chills, cough, shortness of breath, abdominal pain, N/V/D  Treatment to date: neti pot, saline nasal spray, Tylenol, Mucinex, allergy nasal spray.   No other aggravating or relieving factors.  No other c/o.  ROS as in subjective.   Objective: Vitals:   10/29/22 1020  BP: 106/72  Pulse: (!) 55  Temp: 97.8 F (36.6 C)  SpO2: 99%    General appearance: Alert, WD/WN, no distress                             Skin: warm, no rash                           Head: no sinus tenderness                            Eyes: conjunctiva normal, corneas clear, PERRLA                            Ears: pearly TMs, external ear canals normal                          Nose: septum midline, turbinates swollen, with erythema              Mouth/throat: MMM, tongue normal, mild pharyngeal erythema                           Neck: supple, no adenopathy, no thyromegaly, nontender                          Heart: RRR                         Lungs: CTA bilaterally, no wheezes, rales, or rhonchi      Assessment: Acute pharyngitis, unspecified etiology  Nasal congestion   Plan: Symptoms improving.  Discussed diagnosis and treatment of URI.  Suggested symptomatic OTC remedies. Nasal saline spray for congestion.  Tylenol OTC prn. Salt water gargles.  Call/return in 2-3 days if symptoms aren't resolving or worsening. I also encouraged her to reach out to her OB since she is pregnant.

## 2022-10-29 NOTE — Telephone Encounter (Signed)
Pt thought she was doing better but no longer has drainage and instead has a cough and sore throat when she swallows. She would like to know what next steps are?

## 2022-11-10 ENCOUNTER — Other Ambulatory Visit: Payer: Self-pay | Admitting: Internal Medicine

## 2022-11-10 DIAGNOSIS — E039 Hypothyroidism, unspecified: Secondary | ICD-10-CM

## 2022-11-10 MED ORDER — LEVOTHYROXINE SODIUM 25 MCG PO TABS
25.0000 ug | ORAL_TABLET | ORAL | 0 refills | Status: DC
  Filled 2022-11-10: qty 45, 90d supply, fill #0

## 2022-11-10 MED ORDER — LEVOTHYROXINE SODIUM 50 MCG PO TABS
50.0000 ug | ORAL_TABLET | ORAL | 0 refills | Status: DC
  Filled 2022-11-10: qty 45, 90d supply, fill #0

## 2022-11-11 ENCOUNTER — Other Ambulatory Visit (HOSPITAL_COMMUNITY): Payer: Self-pay

## 2022-11-12 ENCOUNTER — Ambulatory Visit (INDEPENDENT_AMBULATORY_CARE_PROVIDER_SITE_OTHER): Payer: 59 | Admitting: Obstetrics & Gynecology

## 2022-11-12 ENCOUNTER — Other Ambulatory Visit (HOSPITAL_COMMUNITY)
Admission: RE | Admit: 2022-11-12 | Discharge: 2022-11-12 | Disposition: A | Discharge status: Home / Self Care | Payer: 59 | Source: Ambulatory Visit | Attending: Obstetrics & Gynecology | Admitting: Obstetrics & Gynecology

## 2022-11-12 VITALS — BP 105/68 | HR 71 | Wt 157.0 lb

## 2022-11-12 DIAGNOSIS — Z3480 Encounter for supervision of other normal pregnancy, unspecified trimester: Secondary | ICD-10-CM | POA: Insufficient documentation

## 2022-11-12 DIAGNOSIS — Z3A13 13 weeks gestation of pregnancy: Secondary | ICD-10-CM | POA: Diagnosis not present

## 2022-11-12 DIAGNOSIS — Z3481 Encounter for supervision of other normal pregnancy, first trimester: Secondary | ICD-10-CM | POA: Diagnosis not present

## 2022-11-12 DIAGNOSIS — O09529 Supervision of elderly multigravida, unspecified trimester: Secondary | ICD-10-CM | POA: Insufficient documentation

## 2022-11-12 DIAGNOSIS — E039 Hypothyroidism, unspecified: Secondary | ICD-10-CM | POA: Diagnosis not present

## 2022-11-12 DIAGNOSIS — O09521 Supervision of elderly multigravida, first trimester: Secondary | ICD-10-CM

## 2022-11-12 NOTE — Progress Notes (Unsigned)
  Subjective:     Brooke Sherman is a G2P1001 [redacted]w[redacted]d being seen today for her first obstetrical visit.  Her obstetrical history is significant for advanced maternal age and hypothyroid . Patient does intend to breast feed. Pregnancy history fully reviewed.  Patient reports no complaints.  Vitals:   11/12/22 0924  BP: 105/68  Pulse: 71  Weight: 71.2 kg    HISTORY: OB History  Gravida Para Term Preterm AB Living  2 1 1     1   SAB IAB Ectopic Multiple Live Births        0 1    # Outcome Date GA Lbr Len/2nd Weight Sex Delivery Anes PTL Lv  2 Current           1 Term 09/19/20 [redacted]w[redacted]d 08:00 / 04:24 3.555 kg M Vag-Spont EPI  LIV   Past Medical History:  Diagnosis Date   Asthma    Childhood   Carpal tunnel syndrome    Hypothyroid    Raynaud's disease    Past Surgical History:  Procedure Laterality Date   ANTERIOR CRUCIATE LIGAMENT REPAIR Left 2019   WISDOM TOOTH EXTRACTION     Family History  Problem Relation Age of Onset   Cervical cancer Mother      Exam    Uterus:   13 weeks  Pelvic Exam:    Perineum: No Hemorrhoids   Vulva: normal   Vagina:  normal mucosa   pH:    Cervix: no lesions   Adnexa: normal adnexa   Bony Pelvis: average  System: Breast:  normal appearance, no masses or tenderness   Skin: normal coloration and turgor, no rashes    Neurologic: normal mood, grossly non-focal, oriented   Extremities: no deformities   HEENT PERRLA   Mouth/Teeth mucous membranes moist, pharynx normal without lesions and dental hygiene good   Neck supple   Cardiovascular: regular rate and rhythm   Respiratory:  appears well, vitals normal, no respiratory distress, acyanotic, normal RR   Abdomen: soft, non-tender; bowel sounds normal; no masses,  no organomegaly   Urinary: urethral meatus normal      Assessment:    Pregnancy: G2P1001 Patient Active Problem List   Diagnosis Date Noted   Supervision of other normal pregnancy, antepartum 09/25/2022    Encounter for general adult medical examination with abnormal findings 07/24/2022   Carpal tunnel syndrome of right wrist 06/29/2021   Carpal tunnel syndrome of left wrist 04/27/2021   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 Prenatal vitamins. Problem list reviewed and updated. Genetic Screening discussed : ordered.  Ultrasound discussed; fetal survey: ordered.  Follow up in 4 weeks. 50% of 30 min visit spent on counseling and coordination of care.     Emeterio Reeve 11/12/2022

## 2022-11-13 LAB — CERVICOVAGINAL ANCILLARY ONLY
Chlamydia: NEGATIVE
Comment: NEGATIVE
Comment: NORMAL
Neisseria Gonorrhea: NEGATIVE

## 2022-11-14 LAB — CYTOLOGY - PAP
Comment: NEGATIVE
Diagnosis: NEGATIVE
High risk HPV: NEGATIVE

## 2022-11-14 LAB — CULTURE, OB URINE

## 2022-11-14 LAB — URINE CULTURE, OB REFLEX: Organism ID, Bacteria: NO GROWTH

## 2022-11-17 LAB — PANORAMA PRENATAL TEST FULL PANEL:PANORAMA TEST PLUS 5 ADDITIONAL MICRODELETIONS: FETAL FRACTION: 17.3

## 2022-11-20 LAB — HORIZON CUSTOM: REPORT SUMMARY: NEGATIVE

## 2022-12-10 ENCOUNTER — Other Ambulatory Visit (HOSPITAL_COMMUNITY): Payer: Self-pay

## 2022-12-10 ENCOUNTER — Ambulatory Visit (INDEPENDENT_AMBULATORY_CARE_PROVIDER_SITE_OTHER): Payer: 59 | Admitting: Obstetrics and Gynecology

## 2022-12-10 VITALS — BP 111/61 | HR 67 | Wt 162.2 lb

## 2022-12-10 DIAGNOSIS — O09522 Supervision of elderly multigravida, second trimester: Secondary | ICD-10-CM

## 2022-12-10 DIAGNOSIS — Z3A17 17 weeks gestation of pregnancy: Secondary | ICD-10-CM

## 2022-12-10 DIAGNOSIS — O09529 Supervision of elderly multigravida, unspecified trimester: Secondary | ICD-10-CM

## 2022-12-10 DIAGNOSIS — Z3480 Encounter for supervision of other normal pregnancy, unspecified trimester: Secondary | ICD-10-CM

## 2022-12-10 DIAGNOSIS — E039 Hypothyroidism, unspecified: Secondary | ICD-10-CM

## 2022-12-10 DIAGNOSIS — G44219 Episodic tension-type headache, not intractable: Secondary | ICD-10-CM

## 2022-12-10 MED ORDER — MAGNESIUM OXIDE 400 MG PO TABS
ORAL_TABLET | Freq: Every day | ORAL | 2 refills | Status: DC
  Filled 2022-12-10: qty 90, 90d supply, fill #0

## 2022-12-10 NOTE — Progress Notes (Unsigned)
   PRENATAL VISIT NOTE  Subjective:  Brooke Sherman is a 37 y.o. G2P1001 at [redacted]w[redacted]d being seen today for ongoing prenatal care.  She is currently monitored for the following issues for this low-risk pregnancy and has Hypothyroidism; Thoracic radiculitis; Protrusion of thoracic intervertebral disc; Carpal tunnel syndrome of left wrist; Carpal tunnel syndrome of right wrist; Encounter for general adult medical examination with abnormal findings; Supervision of other normal pregnancy, antepartum; and Antepartum multigravida of advanced maternal age on their problem list.  Patient reports headache. Occurring 1-2 times per week. Contractions: Not present. Vag. Bleeding: None.  Movement: Present. Denies leaking of fluid.   The following portions of the patient's history were reviewed and updated as appropriate: allergies, current medications, past family history, past medical history, past social history, past surgical history and problem list.   Objective:   Vitals:   12/10/22 0844  BP: 111/61  Pulse: 67  Weight: 162 lb 3.2 oz (73.6 kg)   Fetal Status: Fetal Heart Rate (bpm): 159   Movement: Present     General:  Alert, oriented and cooperative. Patient is in no acute distress.  Skin: Skin is warm and dry. No rash noted.   Cardiovascular: Normal heart rate noted  Respiratory: Normal respiratory effort, no problems with respiration noted  Abdomen: Soft, gravid, appropriate for gestational age.  Pain/Pressure: Absent      Assessment and Plan:  Pregnancy: G2P1001 at [redacted]w[redacted]d 1. Supervision of other normal pregnancy, antepartum 2. [redacted] weeks gestation of pregnancy - AFP, Serum, Open Spina Bifida Anatomy US scheduled   3. Acquired hypothyroidism Continue synthroid  4. Antepartum multigravida of advanced maternal age Does not meet criteria for ldASA  5. Episodic tension-type headache, not intractable Discussed mag supplementation for HA ppx, rx sent  Please refer to After Visit  Summary for other counseling recommendations.   Return in about 4 weeks (around 01/07/2023) for return OB at 21-22 weeks.  Future Appointments  Date Time Provider [place]  12/25/2022  9:30 AM [facility] NURSE [facility]  12/25/2022  9:45 AM [facility] US[facility]  01/07/2023  1:50 PM [place]-K[place], Kathie Dike, CNM [facility] None  03/24/2023  9:40 AM Bo Merino, MD [facility] None   Inez Catalina, MD

## 2022-12-10 NOTE — Progress Notes (Signed)
Pt presents for ROB visit. Pt reports having headaches more frequently. Pt states she checks her BP when she gets these headaches, and her BP is normal. No other concerns at this time.

## 2022-12-12 LAB — AFP, SERUM, OPEN SPINA BIFIDA
AFP MoM: 1.01
AFP Value: 39.5 ng/mL
Gest. Age on Collection Date: 17.5 weeks
Maternal Age At EDD: 36.7 yr
OSBR Risk 1 IN: 10000
Test Results:: NEGATIVE
Weight: 162 [lb_av]

## 2022-12-17 ENCOUNTER — Other Ambulatory Visit (HOSPITAL_COMMUNITY): Payer: Self-pay

## 2022-12-25 ENCOUNTER — Ambulatory Visit: Payer: 59 | Attending: Obstetrics and Gynecology

## 2022-12-25 ENCOUNTER — Other Ambulatory Visit: Payer: Self-pay | Admitting: *Deleted

## 2022-12-25 ENCOUNTER — Ambulatory Visit: Payer: 59 | Admitting: *Deleted

## 2022-12-25 ENCOUNTER — Encounter: Payer: Self-pay | Admitting: *Deleted

## 2022-12-25 VITALS — BP 107/56 | HR 60

## 2022-12-25 DIAGNOSIS — E039 Hypothyroidism, unspecified: Secondary | ICD-10-CM | POA: Diagnosis not present

## 2022-12-25 DIAGNOSIS — Z3689 Encounter for other specified antenatal screening: Secondary | ICD-10-CM

## 2022-12-25 DIAGNOSIS — Z362 Encounter for other antenatal screening follow-up: Secondary | ICD-10-CM

## 2022-12-25 DIAGNOSIS — Z3480 Encounter for supervision of other normal pregnancy, unspecified trimester: Secondary | ICD-10-CM | POA: Diagnosis not present

## 2022-12-25 DIAGNOSIS — Z3A19 19 weeks gestation of pregnancy: Secondary | ICD-10-CM | POA: Diagnosis not present

## 2022-12-25 DIAGNOSIS — O09522 Supervision of elderly multigravida, second trimester: Secondary | ICD-10-CM | POA: Insufficient documentation

## 2022-12-25 DIAGNOSIS — Z79890 Hormone replacement therapy (postmenopausal): Secondary | ICD-10-CM | POA: Insufficient documentation

## 2022-12-25 DIAGNOSIS — Z363 Encounter for antenatal screening for malformations: Secondary | ICD-10-CM | POA: Diagnosis not present

## 2022-12-25 DIAGNOSIS — O99282 Endocrine, nutritional and metabolic diseases complicating pregnancy, second trimester: Secondary | ICD-10-CM | POA: Diagnosis not present

## 2023-01-01 NOTE — Progress Notes (Signed)
[facility] Return Visit  SUBJECTIVE  History of Present Illness: Brooke Sherman is a 37 y.o. female seen for possible prolapse.  She reports she feels like she does not empty her bladder fully and has pressure after intercourse. Denies pain during intercourse.   Patient reports during her last delivery the baby was stuck in the birth canal.   Past Medical History: Patient  has a past medical history of Asthma, Carpal tunnel syndrome, Hypothyroid, and Raynaud's disease.   Past Surgical History: She  has a past surgical history that includes Anterior cruciate ligament repair (Left, 2019) and Wisdom tooth extraction.   Medications: She has a current medication list which includes the following prescription(s): blood pressure kit, [START ON 01/06/2023] estradiol, levothyroxine, levothyroxine, prenatal vit-fe fumarate-fa, and magnesium oxide.   Allergies: Patient has No Known Allergies.   Social History: Patient  reports that she has never smoked. She has never been exposed to tobacco smoke. She has never used smokeless tobacco. She reports that she does not currently use alcohol. She reports that she does not currently use drugs.      OBJECTIVE     Physical Exam: Vitals:   01/03/23 0800  BP: 107/67  Pulse: 79   Gen: No apparent distress, A&O x 3.  Detailed Urogynecologic Evaluation:   POP-Q  -1.5                                            Aa   -1.5                                           Ba  -10.5                                              C   3                                            Gh  4.5                                            Pb  12                                            tvl   -2.5                                            Ap  -2.5                                            Bp  -  11                                              D   Patient has well lubricated vaginal tissue and there is no sign of pelvic floor  muscle tenderness. She has some laxity to the anterior vaginal wall. No signs of polyps, abscess, cyst. Cervix is non-tender and not friable. No sign of levator spasm.       ASSESSMENT AND PLAN    Brooke Sherman is a 37 y.o. with:  1. Prolapse of anterior vaginal wall   2. 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 We discussed good pelvic floor and core strength can be essential to healing following delivery and that pelvic floor PT is never a bad option. We also discussed that pregnancy can put extra pressure to the pelvic floor which could be causing the slight bulge. She would like to do pelvic floor PT to have some of the exercises and then potentially after delivery which is a good plan.  Patient had vaginal dryness after her last delivery. We discussed that doing vaginal estrogen following delivery and while breastfeeding can help with tissue healing and the vaginal dryness associated with breastfeeding.   Patient to start PT. She can follow up after delivery or sooner if she wants.

## 2023-01-03 ENCOUNTER — Ambulatory Visit (INDEPENDENT_AMBULATORY_CARE_PROVIDER_SITE_OTHER): Payer: 59 | Admitting: Obstetrics and Gynecology

## 2023-01-03 ENCOUNTER — Other Ambulatory Visit (HOSPITAL_COMMUNITY): Payer: Self-pay

## 2023-01-03 ENCOUNTER — Encounter: Payer: Self-pay | Admitting: Obstetrics and Gynecology

## 2023-01-03 VITALS — BP 107/67 | HR 79

## 2023-01-03 DIAGNOSIS — N8110 Cystocele, unspecified: Secondary | ICD-10-CM | POA: Diagnosis not present

## 2023-01-03 DIAGNOSIS — N898 Other specified noninflammatory disorders of vagina: Secondary | ICD-10-CM | POA: Diagnosis not present

## 2023-01-03 MED ORDER — ESTRADIOL 0.1 MG/GM VA CREA
TOPICAL_CREAM | VAGINAL | 0 refills | Status: DC
  Filled 2023-01-03: qty 42.5, 90d supply, fill #0

## 2023-01-03 NOTE — Patient Instructions (Signed)
Call PT and schedule an appointment. You have minor prolapse of the anterior vag  The estrogen cream will be for after delivery and while you are breastfeeding. Use a blueberry sized amount onto the finger and massage into the vaginal canal and around vaginal opening after delivery and while you are breastfeeding. Discontinue after breastfeeding.

## 2023-01-07 ENCOUNTER — Ambulatory Visit (INDEPENDENT_AMBULATORY_CARE_PROVIDER_SITE_OTHER): Payer: 59 | Admitting: Advanced Practice Midwife

## 2023-01-07 ENCOUNTER — Encounter: Payer: Self-pay | Admitting: Advanced Practice Midwife

## 2023-01-07 VITALS — BP 104/66 | HR 69 | Wt 163.0 lb

## 2023-01-07 DIAGNOSIS — Z3492 Encounter for supervision of normal pregnancy, unspecified, second trimester: Secondary | ICD-10-CM

## 2023-01-07 DIAGNOSIS — N8110 Cystocele, unspecified: Secondary | ICD-10-CM | POA: Insufficient documentation

## 2023-01-07 DIAGNOSIS — Z3A21 21 weeks gestation of pregnancy: Secondary | ICD-10-CM

## 2023-01-07 DIAGNOSIS — E039 Hypothyroidism, unspecified: Secondary | ICD-10-CM

## 2023-01-07 NOTE — Progress Notes (Signed)
   PRENATAL VISIT NOTE  Subjective:  Brooke Sherman is a 37 y.o. G2P1001 at [redacted]w[redacted]d being seen today for ongoing prenatal care.  She is currently monitored for the following issues for this low-risk pregnancy and has Hypothyroidism; Thoracic radiculitis; Protrusion of thoracic intervertebral disc; Carpal tunnel syndrome of left wrist; Carpal tunnel syndrome of right wrist; Supervision of other normal pregnancy, antepartum; and Antepartum multigravida of advanced maternal age on their problem list.  Patient reports  vaginal pressure, urogyn noted a prolapse .  Contractions: Not present. Vag. Bleeding: None.  Movement: Present. Denies leaking of fluid.   The following portions of the patient's history were reviewed and updated as appropriate: allergies, current medications, past family history, past medical history, past social history, past surgical history and problem list.   Objective:   Vitals:   01/07/23 1405 01/07/23 1412  BP:  104/66  Pulse:  69  Weight: 165 lb (74.8 kg) 163 lb (73.9 kg)    Fetal Status: Fetal Heart Rate (bpm): 155   Movement: Present     General:  Alert, oriented and cooperative. Patient is in no acute distress.  Skin: Skin is warm and dry. No rash noted.   Cardiovascular: Normal heart rate noted  Respiratory: Normal respiratory effort, no problems with respiration noted  Abdomen: Soft, gravid, appropriate for gestational age.  Pain/Pressure: Present     Pelvic: Cervical exam deferred        Extremities: Normal range of motion.  Edema: None  Mental Status: Normal mood and affect. Normal behavior. Normal judgment and thought content.   Assessment and Plan:  Pregnancy: G2P1001 at [redacted]w[redacted]d  1. Supervision of low-risk pregnancy, second trimester 2. [redacted] weeks gestation of pregnancy BP and FHR normal Pt does not want to know gender of the baby!   3. 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  Referred to see pelvic floor PT.   4. Hypothyroidism, unspecified type Per MFM f/u growth [place] scheduled in 12 weeks from 3/27 Continue synthroid   Preterm labor symptoms and general obstetric precautions including but not limited to vaginal bleeding, contractions, leaking of fluid and fetal movement were reviewed in detail with the patient. Please refer to After Visit Summary for other counseling recommendations.   Return in 4 weeks for routine prenatal.  Future Appointments  Date Time Provider Department Center  02/19/2023  2:45 PM Barbaraann Faster, [facility] None  03/19/2023  9:30 AM [facility] NURSE [facility]  03/19/2023  9:45 AM [facility] US4 [facility]  03/24/2023  9:40 AM Corliss Skains, Janalyn Rouse, MD [facility] None    Albertine Grates, FNP

## 2023-01-07 NOTE — Progress Notes (Signed)
Pt presents for ROB. Has moderate vaginal pressure, states urogyn noted prolapse. Plans to start pelvic floor therapy in a few weeks for relief. Reports no other concerns today.

## 2023-01-09 ENCOUNTER — Other Ambulatory Visit (HOSPITAL_COMMUNITY): Payer: Self-pay

## 2023-02-04 ENCOUNTER — Ambulatory Visit (INDEPENDENT_AMBULATORY_CARE_PROVIDER_SITE_OTHER): Payer: 59 | Admitting: Obstetrics

## 2023-02-04 ENCOUNTER — Encounter: Payer: Self-pay | Admitting: Obstetrics

## 2023-02-04 VITALS — BP 109/62 | HR 69 | Wt 170.8 lb

## 2023-02-04 DIAGNOSIS — O09529 Supervision of elderly multigravida, unspecified trimester: Secondary | ICD-10-CM

## 2023-02-04 DIAGNOSIS — E039 Hypothyroidism, unspecified: Secondary | ICD-10-CM

## 2023-02-04 DIAGNOSIS — M5431 Sciatica, right side: Secondary | ICD-10-CM

## 2023-02-04 DIAGNOSIS — O0990 Supervision of high risk pregnancy, unspecified, unspecified trimester: Secondary | ICD-10-CM

## 2023-02-04 NOTE — Progress Notes (Signed)
Pt reports fetal movement, denies pain.  

## 2023-02-04 NOTE — Progress Notes (Signed)
Subjective:  Brooke Sherman is a 37 y.o. G2P1001 at [redacted]w[redacted]d being seen today for ongoing prenatal care.  She is currently monitored for the following issues for this high-risk pregnancy and has Hypothyroidism; Thoracic radiculitis; Protrusion of thoracic intervertebral disc; Carpal tunnel syndrome of left wrist; Carpal tunnel syndrome of right wrist; Supervision of other normal pregnancy, antepartum; Antepartum multigravida of advanced maternal age; and Prolapse of anterior vaginal wall on their problem list.  Patient reports no complaints.  Contractions: Not present. Vag. Bleeding: None.  Movement: Present. Denies leaking of fluid.   The following portions of the patient's history were reviewed and updated as appropriate: allergies, current medications, past family history, past medical history, past social history, past surgical history and problem list. Problem list updated.  Objective:   Vitals:   02/04/23 0834  BP: 109/62  Pulse: 69  Weight: 170 lb 12.8 oz (77.5 kg)    Fetal Status: Fetal Heart Rate (bpm): 145   Movement: Present     General:  Alert, oriented and cooperative. Patient is in no acute distress.  Skin: Skin is warm and dry. No rash noted.   Cardiovascular: Normal heart rate noted  Respiratory: Normal respiratory effort, no problems with respiration noted  Abdomen: Soft, gravid, appropriate for gestational age. Pain/Pressure: Absent     Pelvic:  Cervical exam deferred        Extremities: Normal range of motion.  Edema: None  Mental Status: Normal mood and affect. Normal behavior. Normal judgment and thought content.   Urinalysis:      Assessment and Plan:  Pregnancy: G2P1001 at [redacted]w[redacted]d  1. Supervision of high risk pregnancy, antepartum  2. Antepartum multigravida of advanced maternal age  41. Hypothyroidism, unspecified type - clinically stable  4. Sciatica of right side - clinically stable  Preterm labor symptoms and general obstetric precautions  including but not limited to vaginal bleeding, contractions, leaking of fluid and fetal movement were reviewed in detail with the patient. Please refer to After Visit Summary for other counseling recommendations.   Return in about 3 weeks (around 02/25/2023) for [facility], 2 hour OGTT.   Brock Bad, MD 02/04/23

## 2023-02-17 ENCOUNTER — Other Ambulatory Visit: Payer: Self-pay | Admitting: Internal Medicine

## 2023-02-17 DIAGNOSIS — E039 Hypothyroidism, unspecified: Secondary | ICD-10-CM

## 2023-02-18 ENCOUNTER — Other Ambulatory Visit (HOSPITAL_COMMUNITY): Payer: Self-pay

## 2023-02-18 MED ORDER — LEVOTHYROXINE SODIUM 25 MCG PO TABS
25.0000 ug | ORAL_TABLET | ORAL | 0 refills | Status: DC
Start: 2023-02-18 — End: 2023-03-25
  Filled 2023-02-18: qty 45, 90d supply, fill #0

## 2023-02-18 MED ORDER — LEVOTHYROXINE SODIUM 50 MCG PO TABS
50.0000 ug | ORAL_TABLET | ORAL | 0 refills | Status: DC
Start: 2023-02-18 — End: 2023-03-25
  Filled 2023-02-18: qty 45, 90d supply, fill #0

## 2023-02-19 ENCOUNTER — Ambulatory Visit: Payer: 59 | Attending: Obstetrics and Gynecology | Admitting: Physical Therapy

## 2023-02-19 ENCOUNTER — Encounter: Payer: Self-pay | Admitting: Physical Therapy

## 2023-02-19 ENCOUNTER — Other Ambulatory Visit: Payer: Self-pay

## 2023-02-19 DIAGNOSIS — R279 Unspecified lack of coordination: Secondary | ICD-10-CM | POA: Insufficient documentation

## 2023-02-19 DIAGNOSIS — M6281 Muscle weakness (generalized): Secondary | ICD-10-CM | POA: Diagnosis not present

## 2023-02-19 DIAGNOSIS — R293 Abnormal posture: Secondary | ICD-10-CM | POA: Insufficient documentation

## 2023-02-19 NOTE — Therapy (Signed)
OUTPATIENT PHYSICAL THERAPY FEMALE PELVIC EVALUATION   Patient Name: Brooke Sherman MRN: [id number] DOB:1986/02/05, 37 y.o., female Today's Date: 02/19/2023  END OF SESSION:  PT End of Session - 02/19/23 1443     Visit Number 1    Date for PT Re-Evaluation 05/22/23    Authorization Type cone save    PT Start Time 1445    PT Stop Time 1523    PT Time Calculation (min) 38 min    Activity Tolerance Patient tolerated treatment well    Behavior During Therapy [facility] for tasks assessed/performed             Past Medical History:  Diagnosis Date   Asthma    Childhood   Carpal tunnel syndrome    Hypothyroid    Raynaud's disease    Past Surgical History:  Procedure Laterality Date   ANTERIOR CRUCIATE LIGAMENT REPAIR Left 2019   WISDOM TOOTH EXTRACTION     Patient Active Problem List   Diagnosis Date Noted   Prolapse of anterior vaginal wall 01/07/2023   Antepartum multigravida of advanced maternal age 50/13/2024   Supervision of other normal pregnancy, antepartum 09/25/2022   Carpal tunnel syndrome of right wrist 06/29/2021   Carpal tunnel syndrome of left wrist 04/27/2021   Protrusion of thoracic intervertebral disc 01/05/2021   Thoracic radiculitis 12/13/2020   Hypothyroidism 05/14/2020    PCP: Etta Grandchild, MD  REFERRING PROVIDER: Selmer Dominion, NP   REFERRING DIAG: N81.10 (ICD-10-CM) - Prolapse of anterior vaginal wall  THERAPY DIAG:  Muscle weakness (generalized)  Abnormal posture  Unspecified lack of coordination  Rationale for Evaluation and Treatment: Rehabilitation  ONSET DATE: 2 years ago (after first)  SUBJECTIVE:                                                                                                                                                                                           SUBJECTIVE STATEMENT: Pt is [redacted]wks pregnant and reports after first felt like something didn't feel right. Did have 2nd deg sulcal  tearing with first and took a long time to heal. Now with second pregnancy feels more pressure with sitting especially at toilet, sex painful a bit, and has pressure afterward, can't fully empty bladder and feels like urine frequency is higher.   Fluid intake: Yes: water - a lot of water, decaf cup of coffee in am     PAIN:  Are you having pain? No  PRECAUTIONS: None  WEIGHT BEARING RESTRICTIONS: No  FALLS:  Has patient fallen in last 6 months? No  LIVING ENVIRONMENT: Lives with: lives with their family Lives in:  House/apartment   OCCUPATION: part time interior designer   PLOF: Independent  PATIENT GOALS: to have stronger pelvic floor   PERTINENT HISTORY:  Asthma, Carpal tunnel syndrome, Hypothyroid, and Raynaud's disease, ACL repair 2019  Sexual abuse: No  BOWEL MOVEMENT: Pain with bowel movement: No Type of bowel movement:Type (Bristol Stool Scale) 4, Frequency daily, and Strain No Fully empty rectum: Yes:   Leakage: No Pads: No Fiber supplement: No  URINATION: Pain with urination: No Fully empty bladder: No Stream: Strong Urgency: Yes: but thinks more pregnancy related  Frequency: increased with pregnancy but feels like she goes at least once per hour Leakage:  none Pads: No  INTERCOURSE: Pain with intercourse: After Intercourse Ability to have vaginal penetration:  Yes:   Climax: not painful  Marinoff Scale: 0/3  PREGNANCY: Vaginal deliveries 1 Tearing Yes: 2nd degree C-section deliveries 0 Currently pregnant Yes: 28 weeks  PROLAPSE: Yes- heaviness felt vaginally, worse with sex, increased time on feet (several hours)   OBJECTIVE:   DIAGNOSTIC FINDINGS:    COGNITION: Overall cognitive status: Within functional limits for tasks assessed     SENSATION: Light touch: Appears intact Proprioception: Appears intact  MUSCLE LENGTH: Bil hamstrings and adductors limited by 25%   LUMBAR SPECIAL TESTS:  Single leg stance test: Negative and SI  Compression/distraction test: Negative    POSTURE: rounded shoulders, forward head, and anterior pelvic tilt  PELVIC ALIGNMENT:WFL  LUMBARAROM/PROM:  A/PROM A/PROM  eval  Flexion WFL  Extension WFL  Right lateral flexion WFL  Left lateral flexion WFL  Right rotation Limited by 25%  Left rotation Limited by 25%   (Blank rows = not tested)  LOWER EXTREMITY ROM:  WFL  LOWER EXTREMITY MMT:  Bil hip abduction 3+/5, all other hip 4/5; knees 5/5 - Rt hip/groin pain reported with all MMT at Rt side but most at abduction PALPATION:   General  no TTP externally - with fatigue pt reports sometimes she has pain at Rt hip/SIJ                External Perineal Exam no TTP, mild dryness noted                             Internal Pelvic Floor no TTP  Patient confirms identification and approves PT to assess internal pelvic floor and treatment Yes  PELVIC MMT:   MMT eval  Vaginal 3/5, 6s, 5 reps  Internal Anal Sphincter   External Anal Sphincter   Puborectalis   Diastasis Recti Actively pregnant   (Blank rows = not tested)        TONE: WFL  PROLAPSE: Not seen in hooklying with cough however per chart review urogyn states 0-1 grade anterior wall laxity. Pt reports this started prior to current pregnancy after first child   TODAY'S TREATMENT:  DATE:   02/19/23 EVAL Examination completed, findings reviewed, pt educated on POC, HEP, and donning pregnancy support band for bil pelvic stability. Pt motivated to participate in PT and agreeable to attempt recommendations.     PATIENT EDUCATION:  Education details: [id number] Person educated: Patient Education method: Programmer, multimedia, Demonstration, Actor cues, Verbal cues, and Handouts Education comprehension: verbalized understanding and returned demonstration  HOME EXERCISE  PROGRAM: [id number]  ASSESSMENT:  CLINICAL IMPRESSION: Patient is a 37 y.o. female  who was seen today for physical therapy evaluation and treatment for prolapse. Pt currently [redacted] weeks pregnant with second child, had prolapse/heaviness/drag feeling since birth of first 2.5 years ago. Pt reports she feels it inconsistently and has multiple symptoms with it of pain with sex, worse when on toilet, felt sometimes with activity. Pt found to have mildly decreased flexibility at spine and hip bils, decreased core and hip strength (core limited due to pregnancy), reports Rt hip/groin pain with activity. Patient consented to internal pelvic floor assessment vaginally this date and found to have decreased strength, endurance, and coordination. Patient benefited from verbal cues for improved technique with pelvic floor contractions and breathing mechanics. Pt would benefit from additional PT to further address deficits.     OBJECTIVE IMPAIRMENTS: decreased coordination, decreased endurance, decreased mobility, increased fascial restrictions, increased muscle spasms, impaired flexibility, improper body mechanics, postural dysfunction, and pain.   ACTIVITY LIMITATIONS: carrying, lifting, squatting, and locomotion level  PARTICIPATION LIMITATIONS: community activity and fitness  PERSONAL FACTORS: Time since onset of injury/illness/exacerbation and 1 comorbidity: currently pregnant, previous vaginal birth with tearing  are also affecting patient's functional outcome.   REHAB POTENTIAL: Good  CLINICAL DECISION MAKING: Stable/uncomplicated  EVALUATION COMPLEXITY: Low   GOALS: Goals reviewed with patient? Yes  SHORT TERM GOALS: Target date: 03/12/23  Pt to be I with HEP.  Baseline: Goal status: INITIAL  2.  Pt to demonstrate at least 3/5 pelvic floor strength with at least 8s holds for improved pelvic stability and decreased strain at pelvic floor/ decrease leakage.  Baseline:  Goal status:  INITIAL  3.  Pt to demonstrate improved coordination of pelvic floor and breathing mechanics with 10# squat with appropriate synergistic patterns to decrease pain and leakage at least 75% of the time.    Baseline:  Goal status: INITIAL  4.  Pt to demonstrate I with coordination of pelvic floor and breathing mechanics for decreased strain at pelvic floor.  Baseline:  Goal status: INITIAL   LONG TERM GOALS: Target date: 05/22/23  Pt to be I with advanced HEP.  Baseline:  Goal status: INITIAL  2.  Pt to demonstrate at least 4/5 pelvic floor strength for improved pelvic stability and decreased strain at pelvic floor/ decrease leakage.  Baseline:  Goal status: INITIAL  3.  Pt to demonstrate improved coordination of pelvic floor and breathing mechanics with 20# squat with appropriate synergistic patterns to decrease pain and leakage at least 75% of the time.    Baseline:  Goal status: INITIAL  4.  Pt to demonstrate at least 5/5 bil hip strength for improved pelvic stability and functional squats without leakage.  Baseline:  Goal status: INITIAL  5.  Pt to be I with labor comfort positions to improve mobility and decreased strain at pelvic floor for decreased delivery.  Baseline:  Goal status: INITIAL   PLAN:  PT FREQUENCY: 1x/week  PT DURATION:  6 sessions  PLANNED INTERVENTIONS: Therapeutic exercises, Therapeutic activity, Neuromuscular re-education, Patient/Family education, Self Care, Joint mobilization, Aquatic Therapy, Dry  Needling, Electrical stimulation, Spinal mobilization, Cryotherapy, Moist heat, scar mobilization, Taping, Biofeedback, and Manual therapy  PLAN FOR NEXT SESSION: internal as needed and pt consents, core and hip and pelvic floor strengthening, stretching hips and spine, birthing positions handout, breathing and voiding and lifting mechanics, prolapse relief positions   Otelia Sergeant, PT, DPT 05/22/244:10 PM

## 2023-02-28 ENCOUNTER — Encounter: Payer: Self-pay | Admitting: Obstetrics and Gynecology

## 2023-02-28 ENCOUNTER — Ambulatory Visit (INDEPENDENT_AMBULATORY_CARE_PROVIDER_SITE_OTHER): Payer: 59 | Admitting: Obstetrics and Gynecology

## 2023-02-28 ENCOUNTER — Other Ambulatory Visit: Payer: 59

## 2023-02-28 VITALS — BP 106/69 | HR 77 | Wt 175.2 lb

## 2023-02-28 DIAGNOSIS — Z23 Encounter for immunization: Secondary | ICD-10-CM

## 2023-02-28 DIAGNOSIS — Z3A29 29 weeks gestation of pregnancy: Secondary | ICD-10-CM

## 2023-02-28 DIAGNOSIS — Z1339 Encounter for screening examination for other mental health and behavioral disorders: Secondary | ICD-10-CM

## 2023-02-28 DIAGNOSIS — N8110 Cystocele, unspecified: Secondary | ICD-10-CM

## 2023-02-28 DIAGNOSIS — Z3480 Encounter for supervision of other normal pregnancy, unspecified trimester: Secondary | ICD-10-CM | POA: Diagnosis not present

## 2023-02-28 DIAGNOSIS — E039 Hypothyroidism, unspecified: Secondary | ICD-10-CM | POA: Diagnosis not present

## 2023-02-28 DIAGNOSIS — O09523 Supervision of elderly multigravida, third trimester: Secondary | ICD-10-CM

## 2023-02-28 DIAGNOSIS — O09529 Supervision of elderly multigravida, unspecified trimester: Secondary | ICD-10-CM

## 2023-02-28 NOTE — Progress Notes (Signed)
Subjective:  Brooke Sherman is a 37 y.o. G2P1001 at [redacted]w[redacted]d being seen today for ongoing prenatal care.  She is currently monitored for the following issues for this low-risk pregnancy and has Hypothyroidism; Thoracic radiculitis; Protrusion of thoracic intervertebral disc; Carpal tunnel syndrome of left wrist; Carpal tunnel syndrome of right wrist; Supervision of other normal pregnancy, antepartum; Antepartum multigravida of advanced maternal age; and Prolapse of anterior vaginal wall on their problem list.  Patient reports no complaints.  Contractions: Irritability. Vag. Bleeding: None.  Movement: Present. Denies leaking of fluid.   The following portions of the patient's history were reviewed and updated as appropriate: allergies, current medications, past family history, past medical history, past social history, past surgical history and problem list. Problem list updated.  Objective:   Vitals:   02/28/23 0816  BP: 106/69  Pulse: 77  Weight: 175 lb 3.2 oz (79.5 kg)    Fetal Status: Fetal Heart Rate (bpm): 152   Movement: Present     General:  Alert, oriented and cooperative. Patient is in no acute distress.  Skin: Skin is warm and dry. No rash noted.   Cardiovascular: Normal heart rate noted  Respiratory: Normal respiratory effort, no problems with respiration noted  Abdomen: Soft, gravid, appropriate for gestational age. Pain/Pressure: Present     Pelvic:  Cervical exam deferred        Extremities: Normal range of motion.  Edema: None  Mental Status: Normal mood and affect. Normal behavior. Normal judgment and thought content.   Urinalysis:      Assessment and Plan:  Pregnancy: G2P1001 at [redacted]w[redacted]d  1. Supervision of other normal pregnancy, antepartum Stable - Tdap vaccine greater than or equal to 7yo IM - CBC - RPR - HIV Antibody (routine testing w rflx) - Glucose Tolerance, 2 Hours w/1 Hour - TSH  2. Hypothyroidism, unspecified type Continue with current  treatment - TSH  3. Antepartum multigravida of advanced maternal age Negative genetic studies  4. Prolapse of anterior vaginal wall Has seen UroGYn and PT  Preterm labor symptoms and general obstetric precautions including but not limited to vaginal bleeding, contractions, leaking of fluid and fetal movement were reviewed in detail with the patient. Please refer to After Visit Summary for other counseling recommendations.  Return in about 2 weeks (around 03/14/2023) for OB visit, face to face, any provider.   Hermina Staggers, MD

## 2023-03-01 LAB — CBC
Hematocrit: 34.8 % (ref 34.0–46.6)
Hemoglobin: 11.7 g/dL (ref 11.1–15.9)
MCH: 28.7 pg (ref 26.6–33.0)
MCHC: 33.6 g/dL (ref 31.5–35.7)
MCV: 85 fL (ref 79–97)
Platelets: 188 10*3/uL (ref 150–450)
RBC: 4.08 x10E6/uL (ref 3.77–5.28)
RDW: 13.2 % (ref 11.7–15.4)
WBC: 8.4 10*3/uL (ref 3.4–10.8)

## 2023-03-01 LAB — HIV ANTIBODY (ROUTINE TESTING W REFLEX): HIV Screen 4th Generation wRfx: NONREACTIVE

## 2023-03-01 LAB — GLUCOSE TOLERANCE, 2 HOURS W/ 1HR
Glucose, 1 hour: 123 mg/dL (ref 70–179)
Glucose, 2 hour: 94 mg/dL (ref 70–152)
Glucose, Fasting: 81 mg/dL (ref 70–91)

## 2023-03-01 LAB — TSH: TSH: 3.17 u[IU]/mL (ref 0.450–4.500)

## 2023-03-01 LAB — RPR: RPR Ser Ql: NONREACTIVE

## 2023-03-06 DIAGNOSIS — L918 Other hypertrophic disorders of the skin: Secondary | ICD-10-CM | POA: Diagnosis not present

## 2023-03-06 DIAGNOSIS — D2261 Melanocytic nevi of right upper limb, including shoulder: Secondary | ICD-10-CM | POA: Diagnosis not present

## 2023-03-10 ENCOUNTER — Ambulatory Visit: Payer: 59 | Attending: Obstetrics and Gynecology | Admitting: Physical Therapy

## 2023-03-10 DIAGNOSIS — R293 Abnormal posture: Secondary | ICD-10-CM | POA: Insufficient documentation

## 2023-03-10 DIAGNOSIS — M6281 Muscle weakness (generalized): Secondary | ICD-10-CM | POA: Diagnosis not present

## 2023-03-10 DIAGNOSIS — R279 Unspecified lack of coordination: Secondary | ICD-10-CM | POA: Insufficient documentation

## 2023-03-10 NOTE — Progress Notes (Deleted)
Office Visit Note  Patient: Brooke Sherman             Date of Birth: 27-Aug-1986           MRN: [id number]             PCP: Etta Grandchild, MD Referring: Etta Grandchild, MD Visit Date: 03/24/2023 Occupation: @GUAROCC @  Subjective:  No chief complaint on file.   History of Present Illness: Brooke Sherman is a 37 y.o. female ***patient was evaluated last in December 2023 while she was [redacted] weeks pregnant.    Activities of Daily Living:  Patient reports morning stiffness for *** {minute/hour:19697}.   Patient {ACTIONS;DENIES/REPORTS:21021675::"Denies"} nocturnal pain.  Difficulty dressing/grooming: {ACTIONS;DENIES/REPORTS:21021675::"Denies"} Difficulty climbing stairs: {ACTIONS;DENIES/REPORTS:21021675::"Denies"} Difficulty getting out of chair: {ACTIONS;DENIES/REPORTS:21021675::"Denies"} Difficulty using hands for taps, buttons, cutlery, and/or writing: {ACTIONS;DENIES/REPORTS:21021675::"Denies"}  No Rheumatology ROS completed.   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   Antepartum multigravida of advanced maternal age 21/13/2024   Supervision of other normal pregnancy, antepartum 09/25/2022   Carpal tunnel syndrome of right wrist 06/29/2021   Carpal tunnel syndrome of left wrist 04/27/2021   Protrusion of thoracic intervertebral disc 01/05/2021   Thoracic radiculitis 12/13/2020   Hypothyroidism 05/14/2020    Past Medical History:  Diagnosis Date   Asthma    Childhood   Carpal tunnel syndrome    Hypothyroid    Raynaud's disease     Family History  Problem Relation Age of Onset   Cervical cancer Mother    Past Surgical History:  Procedure Laterality Date   ANTERIOR CRUCIATE LIGAMENT REPAIR Left 2019   WISDOM TOOTH EXTRACTION     Social History   Social History Narrative   Not on file   Immunization History  Administered Date(s) Administered   Influenza,  Quadrivalent, Recombinant, Inj, Pf 07/02/2022   Influenza,inj,Quad PF,6+ Mos 06/12/2020   Influenza-Unspecified 07/11/2021   PFIZER Comirnaty(Gray Top)Covid-19 Tri-Sucrose Vaccine 07/01/2022   Pfizer Covid-19 Vaccine Bivalent Booster 67yrs & up 07/11/2021   Tdap 06/12/2020, 02/28/2023   Unspecified SARS-COV-2 Vaccination 12/12/2019, 01/09/2020, 09/06/2020     Objective: Vital Signs: LMP 08/08/2022 (Exact Date)    Physical Exam   Musculoskeletal Exam: ***  CDAI Exam: CDAI Score: -- Patient Global: --; Provider Global: -- Swollen: --; Tender: -- Joint Exam 03/24/2023   No joint exam has been documented for this visit   There is currently no information documented on the homunculus. Go to the Rheumatology activity and complete the homunculus joint exam.  Investigation: No additional findings.  Imaging: No results found.  Recent Labs: Lab Results  Component Value Date   WBC 8.4 02/28/2023   HGB 11.7 02/28/2023   PLT 188 02/28/2023   NA 139 02/20/2022   K 4.3 02/20/2022   CL 105 02/20/2022   CO2 26 02/20/2022   GLUCOSE 80 02/20/2022   BUN 15 02/20/2022   CREATININE 0.64 02/20/2022   BILITOT 0.7 02/20/2022   AST 18 02/20/2022   ALT 12 02/20/2022   PROT 7.1 02/20/2022   CALCIUM 9.7 02/20/2022    Speciality Comments: No specialty comments available.  Procedures:  No procedures performed Allergies: Patient has no known allergies.   Assessment / Plan:     Visit Diagnoses: Raynaud's syndrome without gangrene  Positive ANA (antinuclear antibody) - Feb 20, 2022 WBC 3.7, ANA 1: 40NS, ENA negative, C3 94, C4 11  Pain in right wrist - MRI by  Dr. Melvyn Novas of her wrist joint which showed mild degenerative changes and interstitial fraying of the ulnar attachment of the TFCC.  Primary osteoarthritis of both hands  Bilateral carpal tunnel syndrome  Chronic left shoulder pain  Chronic pain of left knee  Thoracic radiculitis  Protrusion of thoracic intervertebral  disc  History of hypothyroidism  IUD (intrauterine device) in place  Orders: No orders of the defined types were placed in this encounter.  No orders of the defined types were placed in this encounter.   Face-to-face time spent with patient was *** minutes. Greater than 50% of time was spent in counseling and coordination of care.  Follow-Up Instructions: No follow-ups on file.   Pollyann Savoy, MD  Note - This record has been created using Animal nutritionist.  Chart creation errors have been sought, but may not always  have been located. Such creation errors do not reflect on  the standard of medical care.

## 2023-03-10 NOTE — Therapy (Signed)
OUTPATIENT PHYSICAL THERAPY FEMALE PELVIC TREATMENT   Patient Name: Brooke Sherman MRN: [id number] DOB:[birth year], 37 y.o., female Today's Date: 03/10/2023  END OF SESSION:  PT End of Session - 03/10/23 0932     Visit Number 2    Date for PT Re-Evaluation 05/22/23    Authorization Type cone save    PT Start Time 0931    PT Stop Time 1014    PT Time Calculation (min) 43 min    Activity Tolerance Patient tolerated treatment well    Behavior During Therapy [facility] for tasks assessed/performed             Past Medical History:  Diagnosis Date   Asthma    Childhood   Carpal tunnel syndrome    Hypothyroid    Raynaud's disease    Past Surgical History:  Procedure Laterality Date   ANTERIOR CRUCIATE LIGAMENT REPAIR Left 2019   WISDOM TOOTH EXTRACTION     Patient Active Problem List   Diagnosis Date Noted   Prolapse of anterior vaginal wall 01/07/2023   Antepartum multigravida of advanced maternal age 12/11/2022   Supervision of other normal pregnancy, antepartum 09/25/2022   Carpal tunnel syndrome of right wrist 06/29/2021   Carpal tunnel syndrome of left wrist 04/27/2021   Protrusion of thoracic intervertebral disc 01/05/2021   Thoracic radiculitis 12/13/2020   Hypothyroidism 05/14/2020    PCP: Etta Grandchild, MD  REFERRING PROVIDER: Selmer Dominion, NP   REFERRING DIAG: N81.10 (ICD-10-CM) - Prolapse of anterior vaginal wall  THERAPY DIAG:  Muscle weakness (generalized)  Abnormal posture  Unspecified lack of coordination  Rationale for Evaluation and Treatment: Rehabilitation  ONSET DATE: 2 years ago (after first)  SUBJECTIVE:                                                                                                                                                                                           SUBJECTIVE STATEMENT: Pt is no 30wks; still working out, does shorter workouts as needed. Lower weights now. Does get winded with  workouts but tolerable. Denies all spotting or increased heaviness. Heaviness might occur at end of day or if on feet a lot.   Fluid intake: Yes: water - a lot of water, decaf cup of coffee in am     PAIN:  Are you having pain? No  PRECAUTIONS: None  WEIGHT BEARING RESTRICTIONS: No  FALLS:  Has patient fallen in last 6 months? No  LIVING ENVIRONMENT: Lives with: lives with their family Lives in: House/apartment   OCCUPATION: part time interior designer   PLOF: Independent  PATIENT GOALS: to have stronger pelvic  floor   PERTINENT HISTORY:  Asthma, Carpal tunnel syndrome, Hypothyroid, and Raynaud's disease, ACL repair 2019  Sexual abuse: No  BOWEL MOVEMENT: Pain with bowel movement: No Type of bowel movement:Type (Bristol Stool Scale) 4, Frequency daily, and Strain No Fully empty rectum: Yes:   Leakage: No Pads: No Fiber supplement: No  URINATION: Pain with urination: No Fully empty bladder: No Stream: Strong Urgency: Yes: but thinks more pregnancy related  Frequency: increased with pregnancy but feels like she goes at least once per hour Leakage:  none Pads: No  INTERCOURSE: Pain with intercourse: After Intercourse Ability to have vaginal penetration:  Yes:   Climax: not painful  Marinoff Scale: 0/3  PREGNANCY: Vaginal deliveries 1 Tearing Yes: 2nd degree C-section deliveries 0 Currently pregnant Yes: 28 weeks  PROLAPSE: Yes- heaviness felt vaginally, worse with sex, increased time on feet (several hours)   OBJECTIVE:   DIAGNOSTIC FINDINGS:    COGNITION: Overall cognitive status: Within functional limits for tasks assessed     SENSATION: Light touch: Appears intact Proprioception: Appears intact  MUSCLE LENGTH: Bil hamstrings and adductors limited by 25%   LUMBAR SPECIAL TESTS:  Single leg stance test: Negative and SI Compression/distraction test: Negative    POSTURE: rounded shoulders, forward head, and anterior pelvic tilt  PELVIC  ALIGNMENT:WFL  LUMBARAROM/PROM:  A/PROM A/PROM  eval  Flexion WFL  Extension WFL  Right lateral flexion WFL  Left lateral flexion WFL  Right rotation Limited by 25%  Left rotation Limited by 25%   (Blank rows = not tested)  LOWER EXTREMITY ROM:  WFL  LOWER EXTREMITY MMT:  Bil hip abduction 3+/5, all other hip 4/5; knees 5/5 - Rt hip/groin pain reported with all MMT at Rt side but most at abduction PALPATION:   General  no TTP externally - with fatigue pt reports sometimes she has pain at Rt hip/SIJ                External Perineal Exam no TTP, mild dryness noted                             Internal Pelvic Floor no TTP  Patient confirms identification and approves PT to assess internal pelvic floor and treatment Yes  PELVIC MMT:   MMT eval  Vaginal 3/5, 6s, 5 reps  Internal Anal Sphincter   External Anal Sphincter   Puborectalis   Diastasis Recti Actively pregnant   (Blank rows = not tested)        TONE: WFL  PROLAPSE: Not seen in hooklying with cough however per chart review urogyn states 0-1 grade anterior wall laxity. Pt reports this started prior to current pregnancy after first child   TODAY'S TREATMENT:                                                                                                                              DATE:  03/10/23:  Pt given labor comfort position handout and reviewed. Pt denied questions. All epidural positions practiced as pt more interested in these 2x10 reverse clams 2x1 mins hip IR stretch Sitting hip shifts x10  Hip shift stretch 2x 1 min each    PATIENT EDUCATION:  Education details: [id number] Person educated: Patient Education method: Programmer, multimedia, Demonstration, Actor cues, Verbal cues, and Handouts Education comprehension: verbalized understanding and returned demonstration  HOME EXERCISE PROGRAM: [email or web address]  ASSESSMENT:  CLINICAL IMPRESSION: Patient presents for treatment, feels like she is doing well  overall but feels pressure with increased activity or after being feet for a few hours. Has been modifying exercises as needed, notes feeling tight in hips and low back bothers her after prenatal core workouts. All addressed during session and pt reports she feel comfortable continuing these at home. Denied additional questions. Pt would benefit from additional PT to further address deficits.     OBJECTIVE IMPAIRMENTS: decreased coordination, decreased endurance, decreased mobility, increased fascial restrictions, increased muscle spasms, impaired flexibility, improper body mechanics, postural dysfunction, and pain.   ACTIVITY LIMITATIONS: carrying, lifting, squatting, and locomotion level  PARTICIPATION LIMITATIONS: community activity and fitness  PERSONAL FACTORS: Time since onset of injury/illness/exacerbation and 1 comorbidity: currently pregnant, previous vaginal birth with tearing  are also affecting patient's functional outcome.   REHAB POTENTIAL: Good  CLINICAL DECISION MAKING: Stable/uncomplicated  EVALUATION COMPLEXITY: Low   GOALS: Goals reviewed with patient? Yes  SHORT TERM GOALS: Target date: 03/12/23  Pt to be I with HEP.  Baseline: Goal status: INITIAL  2.  Pt to demonstrate at least 3/5 pelvic floor strength with at least 8s holds for improved pelvic stability and decreased strain at pelvic floor/ decrease leakage.  Baseline:  Goal status: INITIAL  3.  Pt to demonstrate improved coordination of pelvic floor and breathing mechanics with 10# squat with appropriate synergistic patterns to decrease pain and leakage at least 75% of the time.    Baseline:  Goal status: INITIAL  4.  Pt to demonstrate I with coordination of pelvic floor and breathing mechanics for decreased strain at pelvic floor.  Baseline:  Goal status: INITIAL   LONG TERM GOALS: Target date: 05/22/23  Pt to be I with advanced HEP.  Baseline:  Goal status: INITIAL  2.  Pt to demonstrate at least  4/5 pelvic floor strength for improved pelvic stability and decreased strain at pelvic floor/ decrease leakage.  Baseline:  Goal status: INITIAL  3.  Pt to demonstrate improved coordination of pelvic floor and breathing mechanics with 20# squat with appropriate synergistic patterns to decrease pain and leakage at least 75% of the time.    Baseline:  Goal status: INITIAL  4.  Pt to demonstrate at least 5/5 bil hip strength for improved pelvic stability and functional squats without leakage.  Baseline:  Goal status: INITIAL  5.  Pt to be I with labor comfort positions to improve mobility and decreased strain at pelvic floor for decreased delivery.  Baseline:  Goal status: INITIAL   PLAN:  PT FREQUENCY: 1x/week  PT DURATION:  6 sessions  PLANNED INTERVENTIONS: Therapeutic exercises, Therapeutic activity, Neuromuscular re-education, Patient/Family education, Self Care, Joint mobilization, Aquatic Therapy, Dry Needling, Electrical stimulation, Spinal mobilization, Cryotherapy, Moist heat, scar mobilization, Taping, Biofeedback, and Manual therapy  PLAN FOR NEXT SESSION: internal as needed and pt consents, core and hip and pelvic floor strengthening, stretching hips and spine, birthing positions handout, breathing and voiding and lifting mechanics, prolapse relief positions   Otelia Sergeant,  PT, DPT 03/09/2409:15 AM

## 2023-03-17 DIAGNOSIS — O0990 Supervision of high risk pregnancy, unspecified, unspecified trimester: Secondary | ICD-10-CM | POA: Insufficient documentation

## 2023-03-19 ENCOUNTER — Ambulatory Visit (INDEPENDENT_AMBULATORY_CARE_PROVIDER_SITE_OTHER): Payer: 59 | Admitting: Obstetrics

## 2023-03-19 ENCOUNTER — Ambulatory Visit: Payer: 59 | Attending: Obstetrics and Gynecology

## 2023-03-19 ENCOUNTER — Ambulatory Visit: Payer: 59 | Admitting: *Deleted

## 2023-03-19 ENCOUNTER — Other Ambulatory Visit: Payer: Self-pay | Admitting: *Deleted

## 2023-03-19 ENCOUNTER — Encounter: Payer: Self-pay | Admitting: Obstetrics

## 2023-03-19 VITALS — BP 105/54 | HR 67

## 2023-03-19 VITALS — BP 105/70 | HR 96 | Wt 179.0 lb

## 2023-03-19 DIAGNOSIS — Z362 Encounter for other antenatal screening follow-up: Secondary | ICD-10-CM | POA: Diagnosis not present

## 2023-03-19 DIAGNOSIS — O09522 Supervision of elderly multigravida, second trimester: Secondary | ICD-10-CM | POA: Insufficient documentation

## 2023-03-19 DIAGNOSIS — Z3A31 31 weeks gestation of pregnancy: Secondary | ICD-10-CM

## 2023-03-19 DIAGNOSIS — O0990 Supervision of high risk pregnancy, unspecified, unspecified trimester: Secondary | ICD-10-CM

## 2023-03-19 DIAGNOSIS — O99283 Endocrine, nutritional and metabolic diseases complicating pregnancy, third trimester: Secondary | ICD-10-CM

## 2023-03-19 DIAGNOSIS — E039 Hypothyroidism, unspecified: Secondary | ICD-10-CM | POA: Insufficient documentation

## 2023-03-19 DIAGNOSIS — O09523 Supervision of elderly multigravida, third trimester: Secondary | ICD-10-CM

## 2023-03-19 DIAGNOSIS — O09529 Supervision of elderly multigravida, unspecified trimester: Secondary | ICD-10-CM

## 2023-03-19 NOTE — Progress Notes (Signed)
Pt presents for ROB. Wants to discuss TSH labs.

## 2023-03-19 NOTE — Progress Notes (Signed)
Subjective:  Brooke Sherman is a 37 y.o. G2P1001 at [redacted]w[redacted]d being seen today for ongoing prenatal care.  She is currently monitored for the following issues for this high-risk pregnancy and has Hypothyroidism; Antepartum multigravida of advanced maternal age; Prolapse of anterior vaginal wall; and Supervision of high risk pregnancy, antepartum on their problem list.  Patient reports no complaints.   .  .   . Denies leaking of fluid.   The following portions of the patient's history were reviewed and updated as appropriate: allergies, current medications, past family history, past medical history, past social history, past surgical history and problem list. Problem list updated.  Objective:  There were no vitals filed for this visit.  Fetal Status:           General:  Alert, oriented and cooperative. Patient is in no acute distress.  Skin: Skin is warm and dry. No rash noted.   Cardiovascular: Normal heart rate noted  Respiratory: Normal respiratory effort, no problems with respiration noted  Abdomen: Soft, gravid, appropriate for gestational age.       Pelvic:  Cervical exam deferred        Extremities: Normal range of motion.     Mental Status: Normal mood and affect. Normal behavior. Normal judgment and thought content.   Urinalysis:      Assessment and Plan:  Pregnancy: G2P1001 at [redacted]w[redacted]d  1. Supervision of high risk pregnancy, antepartum  2. Hypothyroidism, unspecified type Rx: - TSH - Thyroid stimulating immunoglobulin  3. Antepartum multigravida of advanced maternal age    Preterm labor symptoms and general obstetric precautions including but not limited to vaginal bleeding, contractions, leaking of fluid and fetal movement were reviewed in detail with the patient. Please refer to After Visit Summary for other counseling recommendations.    Brock Bad, MD 03/19/23

## 2023-03-21 ENCOUNTER — Encounter: Payer: Self-pay | Admitting: Obstetrics

## 2023-03-21 LAB — TSH: TSH: 3.54 u[IU]/mL (ref 0.450–4.500)

## 2023-03-21 LAB — THYROID STIMULATING IMMUNOGLOBULIN: Thyroid Stim Immunoglobulin: <0.1 IU/L (ref 0.00–0.55)

## 2023-03-24 ENCOUNTER — Ambulatory Visit: Payer: 59 | Admitting: Rheumatology

## 2023-03-24 DIAGNOSIS — G5603 Carpal tunnel syndrome, bilateral upper limbs: Secondary | ICD-10-CM

## 2023-03-24 DIAGNOSIS — Z975 Presence of (intrauterine) contraceptive device: Secondary | ICD-10-CM

## 2023-03-24 DIAGNOSIS — M25531 Pain in right wrist: Secondary | ICD-10-CM

## 2023-03-24 DIAGNOSIS — M19041 Primary osteoarthritis, right hand: Secondary | ICD-10-CM

## 2023-03-24 DIAGNOSIS — I7300 Raynaud's syndrome without gangrene: Secondary | ICD-10-CM

## 2023-03-24 DIAGNOSIS — M5124 Other intervertebral disc displacement, thoracic region: Secondary | ICD-10-CM

## 2023-03-24 DIAGNOSIS — G8929 Other chronic pain: Secondary | ICD-10-CM

## 2023-03-24 DIAGNOSIS — M5414 Radiculopathy, thoracic region: Secondary | ICD-10-CM

## 2023-03-24 DIAGNOSIS — Z8639 Personal history of other endocrine, nutritional and metabolic disease: Secondary | ICD-10-CM

## 2023-03-24 DIAGNOSIS — R768 Other specified abnormal immunological findings in serum: Secondary | ICD-10-CM

## 2023-03-25 ENCOUNTER — Other Ambulatory Visit: Payer: Self-pay | Admitting: Obstetrics

## 2023-03-25 ENCOUNTER — Other Ambulatory Visit: Payer: Self-pay

## 2023-03-25 ENCOUNTER — Other Ambulatory Visit (HOSPITAL_COMMUNITY): Payer: Self-pay

## 2023-03-25 DIAGNOSIS — E039 Hypothyroidism, unspecified: Secondary | ICD-10-CM

## 2023-03-25 MED ORDER — LEVOTHYROXINE SODIUM 25 MCG PO TABS
37.5000 ug | ORAL_TABLET | Freq: Every day | ORAL | 5 refills | Status: DC
  Filled 2023-03-25 – 2023-03-26 (×2): qty 45, 30d supply, fill #0

## 2023-03-25 MED ORDER — LEVOTHYROXINE SODIUM 50 MCG PO TABS
50.0000 ug | ORAL_TABLET | ORAL | 5 refills | Status: DC
Start: 2023-03-25 — End: 2023-03-25
  Filled 2023-03-25: qty 45, 90d supply, fill #0

## 2023-03-25 MED ORDER — LEVOTHYROXINE SODIUM 50 MCG PO TABS
50.0000 ug | ORAL_TABLET | ORAL | 5 refills | Status: DC
Start: 2023-03-25 — End: 2023-04-30
  Filled 2023-03-25: qty 30, 60d supply, fill #0

## 2023-03-26 ENCOUNTER — Other Ambulatory Visit (HOSPITAL_COMMUNITY): Payer: Self-pay

## 2023-03-27 ENCOUNTER — Ambulatory Visit: Payer: 59 | Admitting: Physical Therapy

## 2023-03-27 ENCOUNTER — Ambulatory Visit (INDEPENDENT_AMBULATORY_CARE_PROVIDER_SITE_OTHER): Payer: 59 | Admitting: Obstetrics & Gynecology

## 2023-03-27 VITALS — BP 109/68 | HR 79 | Wt 177.0 lb

## 2023-03-27 DIAGNOSIS — O0993 Supervision of high risk pregnancy, unspecified, third trimester: Secondary | ICD-10-CM

## 2023-03-27 DIAGNOSIS — E039 Hypothyroidism, unspecified: Secondary | ICD-10-CM

## 2023-03-27 DIAGNOSIS — O0990 Supervision of high risk pregnancy, unspecified, unspecified trimester: Secondary | ICD-10-CM

## 2023-03-27 DIAGNOSIS — Z3A33 33 weeks gestation of pregnancy: Secondary | ICD-10-CM

## 2023-03-27 NOTE — Progress Notes (Signed)
   PRENATAL VISIT NOTE  Subjective:  Brooke Sherman is a 37 y.o. G2P1001 at [redacted]w[redacted]d being seen today for ongoing prenatal care.  She is currently monitored for the following issues for this low-risk pregnancy and has Hypothyroidism; Antepartum multigravida of advanced maternal age; Prolapse of anterior vaginal wall; and Supervision of high risk pregnancy, antepartum on their problem list.  Patient reports no complaints.  Contractions: Not present. Vag. Bleeding: None.  Movement: Present. Denies leaking of fluid.   The following portions of the patient's history were reviewed and updated as appropriate: allergies, current medications, past family history, past medical history, past social history, past surgical history and problem list.   Objective:   Vitals:   03/27/23 1332  BP: 109/68  Pulse: 79  Weight: 177 lb (80.3 kg)    Fetal Status: Fetal Heart Rate (bpm): 141   Movement: Present     General:  Alert, oriented and cooperative. Patient is in no acute distress.  Skin: Skin is warm and dry. No rash noted.   Cardiovascular: Normal heart rate noted  Respiratory: Normal respiratory effort, no problems with respiration noted  Abdomen: Soft, gravid, appropriate for gestational age.  Pain/Pressure: Absent     Pelvic: Cervical exam deferred        Extremities: Normal range of motion.  Edema: None  Mental Status: Normal mood and affect. Normal behavior. Normal judgment and thought content.   Assessment and Plan:  Pregnancy: G2P1001 at [redacted]w[redacted]d 1. Supervision of high risk pregnancy, antepartum Dating by sure LMP = [place]  2. Hypothyroidism, unspecified type  Current Outpatient Medications:    Blood Pressure Monitoring (BLOOD PRESSURE KIT) DEVI, Use as directed once a week., Disp: 1 each, Rfl: 0   levothyroxine (SYNTHROID) 25 MCG tablet, Take 1.5 tablets (37.5 mcg total) by mouth daily., Disp: 45 tablet, Rfl: 5   levothyroxine (SYNTHROID) 50 MCG tablet, Take 1 tablet (50 mcg total)  by mouth every other day., Disp: 30 tablet, Rfl: 5   Prenatal Vit-Fe Fumarate-FA (PRENATAL PO), Take by mouth daily., Disp: , Rfl:    Preterm labor symptoms and general obstetric precautions including but not limited to vaginal bleeding, contractions, leaking of fluid and fetal movement were reviewed in detail with the patient. Please refer to After Visit Summary for other counseling recommendations.   Return in about 2 weeks (around 04/10/2023).  Future Appointments  Date Time Provider Department Center  04/07/2023  8:00 AM Barbaraann Faster, PT [facility] None  04/08/2023  1:30 PM Warden Fillers, MD [facility] None  04/21/2023  3:30 PM Barbaraann Faster, PT [facility] None  04/23/2023  9:00 AM [facility]  04/23/2023  1:50 PM Sue Lush, FNP [facility] None  04/28/2023  3:30 PM Barbaraann Faster, PT [facility] None  04/30/2023  1:50 PM Lennart Pall, MD [facility] None  05/07/2023  1:30 PM Adam Phenix, MD [facility] None  05/14/2023  1:10 PM Warden Fillers, MD [facility] None  05/21/2023  1:50 PM Hermina Staggers, MD [facility] None  07/22/2023 11:00 AM Pollyann Savoy, MD [facility] None    Scheryl Darter, MD

## 2023-04-07 ENCOUNTER — Ambulatory Visit: Payer: 59 | Attending: Obstetrics and Gynecology | Admitting: Physical Therapy

## 2023-04-07 DIAGNOSIS — M6281 Muscle weakness (generalized): Secondary | ICD-10-CM | POA: Insufficient documentation

## 2023-04-07 DIAGNOSIS — R293 Abnormal posture: Secondary | ICD-10-CM | POA: Insufficient documentation

## 2023-04-07 DIAGNOSIS — R279 Unspecified lack of coordination: Secondary | ICD-10-CM | POA: Insufficient documentation

## 2023-04-07 NOTE — Therapy (Signed)
OUTPATIENT PHYSICAL THERAPY FEMALE PELVIC TREATMENT   Patient Name: Brooke Sherman MRN: [id number] DOB:28-Nov-1985, 37 y.o., female Today's Date: 04/07/2023  END OF SESSION:  PT End of Session - 04/07/23 0756     Visit Number 3    Date for PT Re-Evaluation 05/22/23    Authorization Type cone save    PT Start Time 0800    PT Stop Time 0840    PT Time Calculation (min) 40 min    Activity Tolerance Patient tolerated treatment well    Behavior During Therapy [facility] for tasks assessed/performed             Past Medical History:  Diagnosis Date   Asthma    Childhood   Carpal tunnel syndrome    Hypothyroid    Raynaud's disease    Past Surgical History:  Procedure Laterality Date   ANTERIOR CRUCIATE LIGAMENT REPAIR Left 2019   WISDOM TOOTH EXTRACTION     Patient Active Problem List   Diagnosis Date Noted   Supervision of high risk pregnancy, antepartum 03/17/2023   Prolapse of anterior vaginal wall 01/07/2023   Antepartum multigravida of advanced maternal age 30/13/2024   Hypothyroidism 05/14/2020    PCP: Etta Grandchild, MD  REFERRING PROVIDER: Selmer Dominion, NP   REFERRING DIAG: N81.10 (ICD-10-CM) - Prolapse of anterior vaginal wall  THERAPY DIAG:  Muscle weakness (generalized)  Abnormal posture  Unspecified lack of coordination  Rationale for Evaluation and Treatment: Rehabilitation  ONSET DATE: 2 years ago (after first)  SUBJECTIVE:                                                                                                                                                                                           SUBJECTIVE STATEMENT: Pt continues to have Rt hip pain, waking from sleep.   Fluid intake: Yes: water - a lot of water, decaf cup of coffee in am     PAIN:  Are you having pain? Yes - 7/10, mostly at night  PRECAUTIONS: None  WEIGHT BEARING RESTRICTIONS: No  FALLS:  Has patient fallen in last 6 months? No  LIVING  ENVIRONMENT: Lives with: lives with their family Lives in: House/apartment   OCCUPATION: part time interior designer   PLOF: Independent  PATIENT GOALS: to have stronger pelvic floor   PERTINENT HISTORY:  Asthma, Carpal tunnel syndrome, Hypothyroid, and Raynaud's disease, ACL repair 2019  Sexual abuse: No  BOWEL MOVEMENT: Pain with bowel movement: No Type of bowel movement:Type (Bristol Stool Scale) 4, Frequency daily, and Strain No Fully empty rectum: Yes:   Leakage: No Pads: No Fiber supplement: No  URINATION:  Pain with urination: No Fully empty bladder: No Stream: Strong Urgency: Yes: but thinks more pregnancy related  Frequency: increased with pregnancy but feels like she goes at least once per hour Leakage:  none Pads: No  INTERCOURSE: Pain with intercourse: After Intercourse Ability to have vaginal penetration:  Yes:   Climax: not painful  Marinoff Scale: 0/3  PREGNANCY: Vaginal deliveries 1 Tearing Yes: 2nd degree C-section deliveries 0 Currently pregnant Yes: 28 weeks  PROLAPSE: Yes- heaviness felt vaginally, worse with sex, increased time on feet (several hours)   OBJECTIVE:   DIAGNOSTIC FINDINGS:    COGNITION: Overall cognitive status: Within functional limits for tasks assessed     SENSATION: Light touch: Appears intact Proprioception: Appears intact  MUSCLE LENGTH: Bil hamstrings and adductors limited by 25%   LUMBAR SPECIAL TESTS:  Single leg stance test: Negative and SI Compression/distraction test: Negative    POSTURE: rounded shoulders, forward head, and anterior pelvic tilt  PELVIC ALIGNMENT:WFL  LUMBARAROM/PROM:  A/PROM A/PROM  eval  Flexion WFL  Extension WFL  Right lateral flexion WFL  Left lateral flexion WFL  Right rotation Limited by 25%  Left rotation Limited by 25%   (Blank rows = not tested)  LOWER EXTREMITY ROM:  WFL  LOWER EXTREMITY MMT:  Bil hip abduction 3+/5, all other hip 4/5; knees 5/5 - Rt  hip/groin pain reported with all MMT at Rt side but most at abduction PALPATION:   General  no TTP externally - with fatigue pt reports sometimes she has pain at Rt hip/SIJ                External Perineal Exam no TTP, mild dryness noted                             Internal Pelvic Floor no TTP  Patient confirms identification and approves PT to assess internal pelvic floor and treatment Yes  PELVIC MMT:   MMT eval  Vaginal 3/5, 6s, 5 reps  Internal Anal Sphincter   External Anal Sphincter   Puborectalis   Diastasis Recti Actively pregnant   (Blank rows = not tested)        TONE: WFL  PROLAPSE: Not seen in hooklying with cough however per chart review urogyn states 0-1 grade anterior wall laxity. Pt reports this started prior to current pregnancy after first child   TODAY'S TREATMENT:                                                                                                                              DATE:   04/07/23: Bike -  5 mins L5 for improved mobility at bil hips with PT present to discuss progress Quad: tail wags x10 Manual in quad with addaday at lumbar spine Rt side and Rt glute med for decreased tension and improved tissue mobility for decreased pain. L1-2 pt tolerated well and reported decreased pain Seated  lateral trunk stretch 3x1 min each with intermittent external cues for lateral rib mobility Elevated lunge stretch 2x1 min each with hip IR Taping - 5x Ktape at abdomen for support and decreased pain - pt educated on how to remove, when to remove, and to remove with any skin reactions/itching. Pt denied known allergies to adhesives and verbalized understanding on how/when to remove tape. Pt reported improved comfort post tape placement.      PATIENT EDUCATION:  Education details: [id number] Person educated: Patient Education method: Explanation, Demonstration, Actor cues, Verbal cues, and Handouts Education comprehension: verbalized understanding and  returned demonstration  HOME EXERCISE PROGRAM: [id number]  ASSESSMENT:  CLINICAL IMPRESSION: Patient presents for treatment, less pressure with exercise modifications. Does report Rt hip pain continues, session focused on this and introduced more rotational stretching, rib mobility with breathing, and taping at abdomen to decreased strain at pelvic floor and pubic/hip pain. Pt tolerated well and reported decreased pain at end of session.  Denied additional questions. Pt would benefit from additional PT to further address deficits.     OBJECTIVE IMPAIRMENTS: decreased coordination, decreased endurance, decreased mobility, increased fascial restrictions, increased muscle spasms, impaired flexibility, improper body mechanics, postural dysfunction, and pain.   ACTIVITY LIMITATIONS: carrying, lifting, squatting, and locomotion level  PARTICIPATION LIMITATIONS: community activity and fitness  PERSONAL FACTORS: Time since onset of injury/illness/exacerbation and 1 comorbidity: currently pregnant, previous vaginal birth with tearing  are also affecting patient's functional outcome.   REHAB POTENTIAL: Good  CLINICAL DECISION MAKING: Stable/uncomplicated  EVALUATION COMPLEXITY: Low   GOALS: Goals reviewed with patient? Yes  SHORT TERM GOALS: Target date: 03/12/23  Pt to be I with HEP.  Baseline: Goal status: MET  2.  Pt to demonstrate at least 3/5 pelvic floor strength with at least 8s holds for improved pelvic stability and decreased strain at pelvic floor/ decrease leakage.  Baseline:  Goal status: on going  3.  Pt to demonstrate improved coordination of pelvic floor and breathing mechanics with 10# squat with appropriate synergistic patterns to decrease pain and leakage at least 75% of the time.    Baseline:  Goal status: MET  4.  Pt to demonstrate I with coordination of pelvic floor and breathing mechanics for decreased strain at pelvic floor.  Baseline:  Goal status: MET   LONG  TERM GOALS: Target date: 05/22/23  Pt to be I with advanced HEP.  Baseline:  Goal status: INITIAL  2.  Pt to demonstrate at least 4/5 pelvic floor strength for improved pelvic stability and decreased strain at pelvic floor/ decrease leakage.  Baseline:  Goal status: INITIAL  3.  Pt to demonstrate improved coordination of pelvic floor and breathing mechanics with 20# squat with appropriate synergistic patterns to decrease pain and leakage at least 75% of the time.    Baseline:  Goal status: INITIAL  4.  Pt to demonstrate at least 5/5 bil hip strength for improved pelvic stability and functional squats without leakage.  Baseline:  Goal status: INITIAL  5.  Pt to be I with labor comfort positions to improve mobility and decreased strain at pelvic floor for decreased delivery.  Baseline:  Goal status: INITIAL   PLAN:  PT FREQUENCY: 1x/week  PT DURATION:  6 sessions  PLANNED INTERVENTIONS: Therapeutic exercises, Therapeutic activity, Neuromuscular re-education, Patient/Family education, Self Care, Joint mobilization, Aquatic Therapy, Dry Needling, Electrical stimulation, Spinal mobilization, Cryotherapy, Moist heat, scar mobilization, Taping, Biofeedback, and Manual therapy  PLAN FOR NEXT SESSION: internal as needed and pt consents, core  and hip and pelvic floor strengthening, stretching hips and spine, birthing positions handout, breathing and voiding and lifting mechanics, prolapse relief positions   Otelia Sergeant, PT, DPT 07/08/249:57 AM

## 2023-04-08 ENCOUNTER — Ambulatory Visit (INDEPENDENT_AMBULATORY_CARE_PROVIDER_SITE_OTHER): Payer: 59 | Admitting: Obstetrics and Gynecology

## 2023-04-08 VITALS — BP 106/64 | HR 76 | Wt 181.5 lb

## 2023-04-08 DIAGNOSIS — O09523 Supervision of elderly multigravida, third trimester: Secondary | ICD-10-CM

## 2023-04-08 DIAGNOSIS — O0990 Supervision of high risk pregnancy, unspecified, unspecified trimester: Secondary | ICD-10-CM

## 2023-04-08 DIAGNOSIS — O09529 Supervision of elderly multigravida, unspecified trimester: Secondary | ICD-10-CM

## 2023-04-08 DIAGNOSIS — E039 Hypothyroidism, unspecified: Secondary | ICD-10-CM

## 2023-04-08 DIAGNOSIS — O0993 Supervision of high risk pregnancy, unspecified, third trimester: Secondary | ICD-10-CM

## 2023-04-08 DIAGNOSIS — Z3A34 34 weeks gestation of pregnancy: Secondary | ICD-10-CM

## 2023-04-08 NOTE — Progress Notes (Signed)
   PRENATAL VISIT NOTE  Subjective:  Brooke Sherman is a 37 y.o. G2P1001 at [redacted]w[redacted]d being seen today for ongoing prenatal care.  She is currently monitored for the following issues for this low-risk pregnancy and has Hypothyroidism; Antepartum multigravida of advanced maternal age; Prolapse of anterior vaginal wall; and Supervision of high risk pregnancy, antepartum on their problem list.  Patient doing well with no acute concerns today. She reports no complaints.  Contractions: Not present. Vag. Bleeding: None.  Movement: Present. Denies leaking of fluid.   Long discussion [place].  Possible increased risk for contractions or possible labor.  Pt will decide on travel, MD recommended staying in local area if possible.  The following portions of the patient's history were reviewed and updated as appropriate: allergies, current medications, past family history, past medical history, past social history, past surgical history and problem list. Problem list updated.  Objective:   Vitals:   04/08/23 1347  BP: 106/64  Pulse: 76  Weight: 181 lb 8 oz (82.3 kg)    Fetal Status: Fetal Heart Rate (bpm): 150 Fundal Height: 37 cm Movement: Present     General:  Alert, oriented and cooperative. Patient is in no acute distress.  Skin: Skin is warm and dry. No rash noted.   Cardiovascular: Normal heart rate noted  Respiratory: Normal respiratory effort, no problems with respiration noted  Abdomen: Soft, gravid, appropriate for gestational age.  Pain/Pressure: Present     Pelvic: Cervical exam deferred        Extremities: Normal range of motion.     Mental Status:  Normal mood and affect. Normal behavior. Normal judgment and thought content.   Assessment and Plan:  Pregnancy: G2P1001 at [redacted]w[redacted]d  1. [redacted] weeks gestation of pregnancy   2. Hypothyroidism, unspecified type Pt continues with synthroid  3. Supervision of high risk pregnancy,  antepartum Continue routine prenatal care  4. Antepartum multigravida of advanced maternal age Growth scan 7/24  Preterm labor symptoms and general obstetric precautions including but not limited to vaginal bleeding, contractions, leaking of fluid and fetal movement were reviewed in detail with the patient.  Please refer to After Visit Summary for other counseling recommendations.   Return in about 1 week (around 04/15/2023) for ROB, in person, 36 weeks swabs.   Mariel Aloe, MD Faculty Attending [facility]

## 2023-04-21 ENCOUNTER — Ambulatory Visit: Payer: 59 | Admitting: Physical Therapy

## 2023-04-23 ENCOUNTER — Ambulatory Visit (INDEPENDENT_AMBULATORY_CARE_PROVIDER_SITE_OTHER): Payer: 59 | Admitting: Obstetrics and Gynecology

## 2023-04-23 ENCOUNTER — Ambulatory Visit (HOSPITAL_BASED_OUTPATIENT_CLINIC_OR_DEPARTMENT_OTHER): Payer: 59

## 2023-04-23 ENCOUNTER — Encounter: Payer: Self-pay | Admitting: Obstetrics and Gynecology

## 2023-04-23 ENCOUNTER — Other Ambulatory Visit (HOSPITAL_COMMUNITY)
Admission: RE | Admit: 2023-04-23 | Discharge: 2023-04-23 | Disposition: A | Discharge status: Home / Self Care | Payer: 59 | Source: Ambulatory Visit | Attending: Obstetrics and Gynecology | Admitting: Obstetrics and Gynecology

## 2023-04-23 VITALS — BP 104/51 | HR 77 | Wt 182.4 lb

## 2023-04-23 DIAGNOSIS — O0990 Supervision of high risk pregnancy, unspecified, unspecified trimester: Secondary | ICD-10-CM | POA: Insufficient documentation

## 2023-04-23 DIAGNOSIS — E039 Hypothyroidism, unspecified: Secondary | ICD-10-CM

## 2023-04-23 DIAGNOSIS — O99283 Endocrine, nutritional and metabolic diseases complicating pregnancy, third trimester: Secondary | ICD-10-CM | POA: Diagnosis not present

## 2023-04-23 DIAGNOSIS — Z3A36 36 weeks gestation of pregnancy: Secondary | ICD-10-CM

## 2023-04-23 DIAGNOSIS — O09529 Supervision of elderly multigravida, unspecified trimester: Secondary | ICD-10-CM

## 2023-04-23 DIAGNOSIS — O09523 Supervision of elderly multigravida, third trimester: Secondary | ICD-10-CM

## 2023-04-23 DIAGNOSIS — O99280 Endocrine, nutritional and metabolic diseases complicating pregnancy, unspecified trimester: Secondary | ICD-10-CM | POA: Insufficient documentation

## 2023-04-23 NOTE — Progress Notes (Signed)
Pt presents for ROB visit. Pt c/o pain on the right side.

## 2023-04-23 NOTE — Progress Notes (Signed)
   PRENATAL VISIT NOTE  Subjective:  Brooke Sherman is a 37 y.o. G2P1001 at [redacted]w[redacted]d being seen today for ongoing prenatal care.  She is currently monitored for the following issues for this high-risk pregnancy and has Hypothyroidism; Antepartum multigravida of advanced maternal age; Prolapse of anterior vaginal wall; and Supervision of high risk pregnancy, antepartum on their problem list.  Patient reports  pain middle of right abdomen, bothers her more in the afternoon and evening. Reports positive fetal movement and no bleeding.  .  Contractions: Irritability. Vag. Bleeding: None.  Movement: Present. Denies leaking of fluid.   The following portions of the patient's history were reviewed and updated as appropriate: allergies, current medications, past family history, past medical history, past social history, past surgical history and problem list.   Objective:   Vitals:   04/23/23 1340  BP: (!) 104/51  Pulse: 77  Weight: 182 lb 6.4 oz (82.7 kg)    Fetal Status: Fetal Heart Rate (bpm): 149 Fundal Height: 37 cm Movement: Present  Presentation: Vertex  General:  Alert, oriented and cooperative. Patient is in no acute distress.  Skin: Skin is warm and dry. No rash noted.   Cardiovascular: Normal heart rate noted  Respiratory: Normal respiratory effort, no problems with respiration noted  Abdomen: Soft, gravid, appropriate for gestational age.  Pain/Pressure: Present     Pelvic: Cervical exam performed with chaperone Dilation: 1 Effacement (%): Thick Station: -3  Extremities: Normal range of motion.  Edema: None  Mental Status: Normal mood and affect. Normal behavior. Normal judgment and thought content.   Assessment and Plan:  Pregnancy: G2P1001 at [redacted]w[redacted]d 1. Supervision of high risk pregnancy, antepartum BP and FHR normal Feeling regular fetal movement  FH appropriate  2. [redacted] weeks gestation of pregnancy Swabs today, discussed weekly visits, labor precautions  discussed Discussed abdominal pain and precautions when to go to hospital  Desired SVE 1/thick/-3 - Culture, beta strep (group b only) - Cervicovaginal ancillary only( [place])  3. Hypothyroidism, unspecified type Continue synthroid  4. Antepartum multigravida of advanced maternal age 30/24 u/s EFW 77%, AC 90% cephalic, normal u/s  Preterm labor symptoms and general obstetric precautions including but not limited to vaginal bleeding, contractions, leaking of fluid and fetal movement were reviewed in detail with the patient. Please refer to After Visit Summary for other counseling recommendations.   Return in about 1 week (around 04/30/2023) for OB VISIT (MD or APP).  Future Appointments  Date Time Provider Department Center  04/28/2023  3:30 PM Barbaraann Faster, [place] [facility] None  04/30/2023  1:50 PM Lennart Pall, MD [facility] None  05/07/2023  1:30 PM Adam Phenix, MD [facility] None  05/14/2023  1:10 PM Warden Fillers, MD [facility] None  05/21/2023  1:50 PM Hermina Staggers, MD [facility] None  07/22/2023 11:00 AM Pollyann Savoy, MD [facility] None    Albertine Grates, FNP

## 2023-04-24 LAB — CERVICOVAGINAL ANCILLARY ONLY
Chlamydia: NEGATIVE
Comment: NEGATIVE
Comment: NEGATIVE
Comment: NORMAL
Trichomonas: NEGATIVE

## 2023-04-28 ENCOUNTER — Ambulatory Visit: Payer: 59 | Admitting: Physical Therapy

## 2023-04-28 DIAGNOSIS — R279 Unspecified lack of coordination: Secondary | ICD-10-CM | POA: Diagnosis not present

## 2023-04-28 DIAGNOSIS — R293 Abnormal posture: Secondary | ICD-10-CM

## 2023-04-28 DIAGNOSIS — M6281 Muscle weakness (generalized): Secondary | ICD-10-CM | POA: Diagnosis not present

## 2023-04-28 NOTE — Therapy (Signed)
OUTPATIENT PHYSICAL THERAPY FEMALE PELVIC TREATMENT   Patient Name: Brooke Sherman MRN: [id number] DOB:1985/11/15, 37 y.o., female Today's Date: 04/28/2023  END OF SESSION:  PT End of Session - 04/28/23 1531     Visit Number 4    Date for PT Re-Evaluation 05/22/23    Authorization Type cone save    PT Start Time 1530    PT Stop Time 1610    PT Time Calculation (min) 40 min    Activity Tolerance Patient tolerated treatment well    Behavior During Therapy WFL for tasks assessed/performed             Past Medical History:  Diagnosis Date   Asthma    Childhood   Carpal tunnel syndrome    Hypothyroid    Raynaud's disease    Past Surgical History:  Procedure Laterality Date   ANTERIOR CRUCIATE LIGAMENT REPAIR Left 2019   WISDOM TOOTH EXTRACTION     Patient Active Problem List   Diagnosis Date Noted   Supervision of high risk pregnancy, antepartum 03/17/2023   Prolapse of anterior vaginal wall 01/07/2023   Antepartum multigravida of advanced maternal age 56/13/2024   Hypothyroidism 05/14/2020    PCP: Etta Grandchild, MD  REFERRING PROVIDER: Selmer Dominion, NP   REFERRING DIAG: N81.10 (ICD-10-CM) - Prolapse of anterior vaginal wall  THERAPY DIAG:  Muscle weakness (generalized)  Abnormal posture  Unspecified lack of coordination  Rationale for Evaluation and Treatment: Rehabilitation  ONSET DATE: 2 years ago (after first)  SUBJECTIVE:                                                                                                                                                                                           SUBJECTIVE STATEMENT: End of day has more pressure and with pregnancy progressing   Fluid intake: Yes: water - a lot of water, decaf cup of coffee in am     PAIN:  Are you having pain? Yes - 7/10, mostly at night  PRECAUTIONS: None  WEIGHT BEARING RESTRICTIONS: No  FALLS:  Has patient fallen in last 6 months?  No  LIVING ENVIRONMENT: Lives with: lives with their family Lives in: House/apartment   OCCUPATION: part time interior designer   PLOF: Independent  PATIENT GOALS: to have stronger pelvic floor   PERTINENT HISTORY:  Asthma, Carpal tunnel syndrome, Hypothyroid, and Raynaud's disease, ACL repair 2019  Sexual abuse: No  BOWEL MOVEMENT: Pain with bowel movement: No Type of bowel movement:Type (Bristol Stool Scale) 4, Frequency daily, and Strain No Fully empty rectum: Yes:   Leakage: No Pads: No Fiber supplement: No  URINATION:  Pain with urination: No Fully empty bladder: No Stream: Strong Urgency: Yes: but thinks more pregnancy related  Frequency: increased with pregnancy but feels like she goes at least once per hour Leakage:  none Pads: No  INTERCOURSE: Pain with intercourse: After Intercourse Ability to have vaginal penetration:  Yes:   Climax: not painful  Marinoff Scale: 0/3  PREGNANCY: Vaginal deliveries 1 Tearing Yes: 2nd degree C-section deliveries 0 Currently pregnant Yes: 28 weeks  PROLAPSE: Yes- heaviness felt vaginally, worse with sex, increased time on feet (several hours)   OBJECTIVE:   DIAGNOSTIC FINDINGS:    COGNITION: Overall cognitive status: Within functional limits for tasks assessed     SENSATION: Light touch: Appears intact Proprioception: Appears intact  MUSCLE LENGTH: Bil hamstrings and adductors limited by 25%   LUMBAR SPECIAL TESTS:  Single leg stance test: Negative and SI Compression/distraction test: Negative    POSTURE: rounded shoulders, forward head, and anterior pelvic tilt  PELVIC ALIGNMENT:WFL  LUMBARAROM/PROM:  A/PROM A/PROM  eval  Flexion WFL  Extension WFL  Right lateral flexion WFL  Left lateral flexion WFL  Right rotation Limited by 25%  Left rotation Limited by 25%   (Blank rows = not tested)  LOWER EXTREMITY ROM:  WFL  LOWER EXTREMITY MMT:  Bil hip abduction 3+/5, all other hip 4/5; knees  5/5 - Rt hip/groin pain reported with all MMT at Rt side but most at abduction PALPATION:   General  no TTP externally - with fatigue pt reports sometimes she has pain at Rt hip/SIJ                External Perineal Exam no TTP, mild dryness noted                             Internal Pelvic Floor no TTP  Patient confirms identification and approves PT to assess internal pelvic floor and treatment Yes  PELVIC MMT:   MMT eval  Vaginal 3/5, 6s, 5 reps  Internal Anal Sphincter   External Anal Sphincter   Puborectalis   Diastasis Recti Actively pregnant   (Blank rows = not tested)        TONE: WFL  PROLAPSE: Not seen in hooklying with cough however per chart review urogyn states 0-1 grade anterior wall laxity. Pt reports this started prior to current pregnancy after first child   TODAY'S TREATMENT:                                                                                                                              DATE:   04/28/23  Taping - 5x Ktape at abdomen for support and decreased pain - pt educated on how to remove, when to remove, and to remove with any skin reactions/itching. Pt denied known allergies to adhesives and verbalized understanding on how/when to remove tape. Pt reported improved comfort post tape placement.  Stretching on ball -  happy baby 2x30s, hip shifting 3x30s each Elevated hip IR rotation stretch 2x30s each Childs pose 2x30s      PATIENT EDUCATION:  Education details: [id number] Person educated: Patient Education method: Programmer, multimedia, Demonstration, Actor cues, Verbal cues, and Handouts Education comprehension: verbalized understanding and returned demonstration  HOME EXERCISE PROGRAM: [email or web address]  ASSESSMENT:  CLINICAL IMPRESSION: Patient presents for treatment, less pressure with exercise modifications. Does report Rt hip pain continues, session focused on this and introduced more rotational stretching, rib mobility with breathing, and taping  at abdomen to decreased strain at pelvic floor and pubic/hip pain. Pt tolerated well and reported decreased pain at end of session.  Denied additional questions. Pt would benefit from additional PT to further address deficits.     OBJECTIVE IMPAIRMENTS: decreased coordination, decreased endurance, decreased mobility, increased fascial restrictions, increased muscle spasms, impaired flexibility, improper body mechanics, postural dysfunction, and pain.   ACTIVITY LIMITATIONS: carrying, lifting, squatting, and locomotion level  PARTICIPATION LIMITATIONS: community activity and fitness  PERSONAL FACTORS: Time since onset of injury/illness/exacerbation and 1 comorbidity: currently pregnant, previous vaginal birth with tearing  are also affecting patient's functional outcome.   REHAB POTENTIAL: Good  CLINICAL DECISION MAKING: Stable/uncomplicated  EVALUATION COMPLEXITY: Low   GOALS: Goals reviewed with patient? Yes  SHORT TERM GOALS: Target date: 03/12/23  Pt to be I with HEP.  Baseline: Goal status: MET  2.  Pt to demonstrate at least 3/5 pelvic floor strength with at least 8s holds for improved pelvic stability and decreased strain at pelvic floor/ decrease leakage.  Baseline:  Goal status: deferred   3.  Pt to demonstrate improved coordination of pelvic floor and breathing mechanics with 10# squat with appropriate synergistic patterns to decrease pain and leakage at least 75% of the time.    Baseline:  Goal status: MET  4.  Pt to demonstrate I with coordination of pelvic floor and breathing mechanics for decreased strain at pelvic floor.  Baseline:  Goal status: MET   LONG TERM GOALS: Target date: 05/22/23  Pt to be I with advanced HEP.  Baseline:  Goal status: MET  2.  Pt to demonstrate at least 4/5 pelvic floor strength for improved pelvic stability and decreased strain at pelvic floor/ decrease leakage.  Baseline:  Goal status: deferred  3.  Pt to demonstrate improved  coordination of pelvic floor and breathing mechanics with 20# squat with appropriate synergistic patterns to decrease pain and leakage at least 75% of the time.    Baseline:  Goal status: MET  4.  Pt to demonstrate at least 5/5 bil hip strength for improved pelvic stability and functional squats without leakage.  Baseline:  Goal status: MET  5.  Pt to be I with labor comfort positions to improve mobility and decreased strain at pelvic floor for decreased delivery.  Baseline:  Goal status: MET   PLAN:  PT FREQUENCY: 1x/week  PT DURATION:  6 sessions  PLANNED INTERVENTIONS: Therapeutic exercises, Therapeutic activity, Neuromuscular re-education, Patient/Family education, Self Care, Joint mobilization, Aquatic Therapy, Dry Needling, Electrical stimulation, Spinal mobilization, Cryotherapy, Moist heat, scar mobilization, Taping, Biofeedback, and Manual therapy  PLAN FOR NEXT SESSION:   PHYSICAL THERAPY DISCHARGE SUMMARY  Visits from Start of Care: 4  Current functional level related to goals / functional outcomes: All goals met except  internal reassessment   Remaining deficits: Does have pressure intermittently but not worse with progression of pregnancy    Education / Equipment: HEP   Patient agrees to discharge. Patient goals were met.  Patient is being discharged due to being pleased with the current functional level.   Otelia Sergeant, PT, DPT 07/29/244:16 PM

## 2023-04-30 ENCOUNTER — Other Ambulatory Visit (HOSPITAL_COMMUNITY): Payer: Self-pay

## 2023-04-30 ENCOUNTER — Ambulatory Visit (INDEPENDENT_AMBULATORY_CARE_PROVIDER_SITE_OTHER): Payer: 59 | Admitting: Obstetrics and Gynecology

## 2023-04-30 VITALS — BP 114/72 | HR 80 | Wt 185.0 lb

## 2023-04-30 DIAGNOSIS — O0990 Supervision of high risk pregnancy, unspecified, unspecified trimester: Secondary | ICD-10-CM

## 2023-04-30 DIAGNOSIS — E039 Hypothyroidism, unspecified: Secondary | ICD-10-CM

## 2023-04-30 DIAGNOSIS — O09529 Supervision of elderly multigravida, unspecified trimester: Secondary | ICD-10-CM

## 2023-04-30 DIAGNOSIS — Z3A37 37 weeks gestation of pregnancy: Secondary | ICD-10-CM

## 2023-04-30 MED ORDER — LEVOTHYROXINE SODIUM 50 MCG PO TABS
50.0000 ug | ORAL_TABLET | Freq: Every day | ORAL | 5 refills | Status: DC
Start: 2023-04-30 — End: 2023-12-09
  Filled 2023-04-30: qty 30, 30d supply, fill #0
  Filled 2023-06-02: qty 30, 30d supply, fill #1
  Filled 2023-07-20: qty 30, 30d supply, fill #2
  Filled 2023-08-20: qty 30, 30d supply, fill #3
  Filled 2023-09-16: qty 30, 30d supply, fill #4
  Filled 2023-11-08: qty 30, 30d supply, fill #5

## 2023-04-30 NOTE — Progress Notes (Signed)
   PRENATAL VISIT NOTE  Subjective:  Brooke Sherman is a 37 y.o. G2P1001 at [redacted]w[redacted]d being seen today for ongoing prenatal care.  She is currently monitored for the following issues for this low-risk pregnancy and has Hypothyroidism; Antepartum multigravida of advanced maternal age; Prolapse of anterior vaginal wall; and Supervision of high risk pregnancy, antepartum on their problem list.  Patient reports  doing well overall .  Contractions: Not present. Vag. Bleeding: None.  Movement: Present. Denies leaking of fluid.   The following portions of the patient's history were reviewed and updated as appropriate: allergies, current medications, past family history, past medical history, past social history, past surgical history and problem list.   Objective:   Vitals:   04/30/23 1346  BP: 114/72  Pulse: 80  Weight: 185 lb (83.9 kg)   Fetal Status: Fetal Heart Rate (bpm): 150   Movement: Present     General:  Alert, oriented and cooperative. Patient is in no acute distress.  Skin: Skin is warm and dry. No rash noted.   Cardiovascular: Normal heart rate noted  Respiratory: Normal respiratory effort, no problems with respiration noted  Abdomen: Soft, gravid, appropriate for gestational age.  Pain/Pressure: Absent     SCE 1.5/l/hi - performed in presence of chaperone  Assessment and Plan:  Pregnancy: G2P1001 at [redacted]w[redacted]d 1. Supervision of high risk pregnancy, antepartum 2. [redacted] weeks gestation of pregnancy Discussed membrane sweep next appt, pdIOL if no spontaneous labor by 41 weeks Discussed dating discrepancy, will use 8/15 as previously discussed with patient. Growth is AGA regardless of EDD  3. Acquired hypothyroidism TSH 3.54 on 6/19 - will increase synthroid to daily Growth @ 36/0 3081g 77%, AC 90%, cephalic, post fundal, 15.49 - levothyroxine (SYNTHROID) 50 MCG tablet; Take 1 tablet (50 mcg total) by mouth daily.  Dispense: 30 tablet; Refill: 5  4. Antepartum  multigravida of advanced maternal age LR NIPS  Please refer to After Visit Summary for other counseling recommendations.   Return in about 1 week (around 05/07/2023) for return OB at 39 weeks.  Future Appointments  Date Time Provider Department Center  05/07/2023  1:30 PM Adam Phenix, MD [facility] None  05/14/2023  1:10 PM Warden Fillers, MD [facility] None  05/21/2023  1:50 PM Hermina Staggers, MD [facility] None  07/22/2023 11:00 AM Pollyann Savoy, MD [facility] None   Lennart Pall, MD

## 2023-05-02 ENCOUNTER — Other Ambulatory Visit (HOSPITAL_COMMUNITY): Payer: Self-pay

## 2023-05-07 ENCOUNTER — Ambulatory Visit (INDEPENDENT_AMBULATORY_CARE_PROVIDER_SITE_OTHER): Payer: 59 | Admitting: Obstetrics & Gynecology

## 2023-05-07 VITALS — BP 108/69 | HR 71 | Wt 186.1 lb

## 2023-05-07 DIAGNOSIS — O0990 Supervision of high risk pregnancy, unspecified, unspecified trimester: Secondary | ICD-10-CM

## 2023-05-07 DIAGNOSIS — Z3A38 38 weeks gestation of pregnancy: Secondary | ICD-10-CM

## 2023-05-07 DIAGNOSIS — O09529 Supervision of elderly multigravida, unspecified trimester: Secondary | ICD-10-CM

## 2023-05-07 DIAGNOSIS — O09523 Supervision of elderly multigravida, third trimester: Secondary | ICD-10-CM

## 2023-05-07 NOTE — Progress Notes (Signed)
Pt. Wants to have her cervix checked and membrane swept.

## 2023-05-07 NOTE — Progress Notes (Signed)
   PRENATAL VISIT NOTE  Subjective:  Brooke Sherman is a 37 y.o. G2P1001 at [redacted]w[redacted]d being seen today for ongoing prenatal care.  She is currently monitored for the following issues for this high-risk pregnancy and has Hypothyroidism; Antepartum multigravida of advanced maternal age; Prolapse of anterior vaginal wall; and Supervision of high risk pregnancy, antepartum on their problem list.  Patient reports occasional contractions.  Contractions: Irritability. Vag. Bleeding: None.  Movement: Present. Denies leaking of fluid.   The following portions of the patient's history were reviewed and updated as appropriate: allergies, current medications, past family history, past medical history, past social history, past surgical history and problem list.   Objective:   Vitals:   05/07/23 1330  BP: 108/69  Pulse: 71  Weight: 186 lb 1.6 oz (84.4 kg)    Fetal Status: Fetal Heart Rate (bpm): 145   Movement: Present  Presentation: Vertex  General:  Alert, oriented and cooperative. Patient is in no acute distress.  Skin: Skin is warm and dry. No rash noted.   Cardiovascular: Normal heart rate noted  Respiratory: Normal respiratory effort, no problems with respiration noted  Abdomen: Soft, gravid, appropriate for gestational age.  Pain/Pressure: Present     Pelvic: Cervical exam performed in the presence of a chaperone Dilation: 2 Effacement (%): 20 Station: -3  Extremities: Normal range of motion.  Edema: None  Mental Status: Normal mood and affect. Normal behavior. Normal judgment and thought content.   Assessment and Plan:  Pregnancy: G2P1001 at [redacted]w[redacted]d 1. Supervision of high risk pregnancy, antepartum Membranes sweep today  2. Antepartum multigravida of advanced maternal age   Term labor symptoms and general obstetric precautions including but not limited to vaginal bleeding, contractions, leaking of fluid and fetal movement were reviewed in detail with the patient. Please refer  to After Visit Summary for other counseling recommendations.   Return in about 1 week (around 05/14/2023).  Future Appointments  Date Time Provider Department Center  05/14/2023  1:10 PM Warden Fillers, MD [facility] None  05/21/2023  1:50 PM Hermina Staggers, MD [facility] None  07/22/2023 11:00 AM Pollyann Savoy, MD [facility] None    Scheryl Darter, MD

## 2023-05-14 ENCOUNTER — Ambulatory Visit: Payer: 59 | Admitting: Obstetrics and Gynecology

## 2023-05-14 VITALS — BP 117/67 | HR 68 | Wt 185.0 lb

## 2023-05-14 DIAGNOSIS — Z3A39 39 weeks gestation of pregnancy: Secondary | ICD-10-CM

## 2023-05-14 DIAGNOSIS — E039 Hypothyroidism, unspecified: Secondary | ICD-10-CM

## 2023-05-14 DIAGNOSIS — O0990 Supervision of high risk pregnancy, unspecified, unspecified trimester: Secondary | ICD-10-CM

## 2023-05-14 DIAGNOSIS — O09523 Supervision of elderly multigravida, third trimester: Secondary | ICD-10-CM

## 2023-05-14 DIAGNOSIS — O09529 Supervision of elderly multigravida, unspecified trimester: Secondary | ICD-10-CM

## 2023-05-14 DIAGNOSIS — O0993 Supervision of high risk pregnancy, unspecified, third trimester: Secondary | ICD-10-CM

## 2023-05-14 DIAGNOSIS — Z3490 Encounter for supervision of normal pregnancy, unspecified, unspecified trimester: Secondary | ICD-10-CM

## 2023-05-14 NOTE — Progress Notes (Signed)
Pt would like cervical check and membrane sweep.

## 2023-05-14 NOTE — Progress Notes (Signed)
   PRENATAL VISIT NOTE  Subjective:  Brooke Sherman is a 37 y.o. G2P1001 at [redacted]w[redacted]d being seen today for ongoing prenatal care.  She is currently monitored for the following issues for this low-risk pregnancy and has Hypothyroidism; Antepartum multigravida of advanced maternal age; Prolapse of anterior vaginal wall; and Supervision of high risk pregnancy, antepartum on their problem list.  Patient doing well with no acute concerns today. She reports no complaints.  Contractions: Irregular.  .  Movement: Present. Denies leaking of fluid. Pt is awaiting the onset of labor.  The following portions of the patient's history were reviewed and updated as appropriate: allergies, current medications, past family history, past medical history, past social history, past surgical history and problem list. Problem list updated.  Objective:   Vitals:   05/14/23 1318  BP: 117/67  Pulse: 68  Weight: 185 lb (83.9 kg)    Fetal Status: Fetal Heart Rate (bpm): 150 Fundal Height: 40 cm Movement: Present     General:  Alert, oriented and cooperative. Patient is in no acute distress.  Skin: Skin is warm and dry. No rash noted.   Cardiovascular: Normal heart rate noted  Respiratory: Normal respiratory effort, no problems with respiration noted  Abdomen: Soft, gravid, appropriate for gestational age.  Pain/Pressure: Present     Pelvic: Cervical exam performed Dilation: 2 Effacement (%): 60 Station: -3  Extremities: Normal range of motion.     Mental Status:  Normal mood and affect. Normal behavior. Normal judgment and thought content.   Assessment and Plan:  Pregnancy: G2P1001 at [redacted]w[redacted]d  1. [redacted] weeks gestation of pregnancy   2. Hypothyroidism, unspecified type Pt continues synthroid  3. Antepartum multigravida of advanced maternal age   74. Supervision of high risk pregnancy, antepartum Continue routine prenatal care, IOL next week, NST early next week for postdates.  Term labor symptoms  and general obstetric precautions including but not limited to vaginal bleeding, contractions, leaking of fluid and fetal movement were reviewed in detail with the patient.  Please refer to After Visit Summary for other counseling recommendations.   Return in about 1 week (around 05/21/2023) for ROB, in person, with NST for postdates, schedule monday or tuesday.   Mariel Aloe, MD Faculty Attending Center for [facility]

## 2023-05-16 ENCOUNTER — Telehealth (HOSPITAL_COMMUNITY): Payer: Self-pay | Admitting: *Deleted

## 2023-05-16 ENCOUNTER — Encounter (HOSPITAL_COMMUNITY): Payer: Self-pay | Admitting: *Deleted

## 2023-05-16 NOTE — Telephone Encounter (Signed)
Preadmission screen  

## 2023-05-18 ENCOUNTER — Encounter: Payer: Self-pay | Admitting: Obstetrics and Gynecology

## 2023-05-19 ENCOUNTER — Other Ambulatory Visit (HOSPITAL_COMMUNITY): Payer: Self-pay | Admitting: Advanced Practice Midwife

## 2023-05-19 ENCOUNTER — Ambulatory Visit (INDEPENDENT_AMBULATORY_CARE_PROVIDER_SITE_OTHER): Payer: 59 | Admitting: Obstetrics and Gynecology

## 2023-05-19 VITALS — BP 115/67 | HR 65 | Wt 185.0 lb

## 2023-05-19 DIAGNOSIS — E039 Hypothyroidism, unspecified: Secondary | ICD-10-CM

## 2023-05-19 DIAGNOSIS — O0990 Supervision of high risk pregnancy, unspecified, unspecified trimester: Secondary | ICD-10-CM

## 2023-05-19 DIAGNOSIS — O09529 Supervision of elderly multigravida, unspecified trimester: Secondary | ICD-10-CM

## 2023-05-19 NOTE — Progress Notes (Signed)
   PRENATAL VISIT NOTE  Subjective:  Brooke Sherman is a 37 y.o. G2P1001 at [redacted]w[redacted]d being seen today for ongoing prenatal care.  She is currently monitored for the following issues for this low-risk pregnancy and has Hypothyroidism; Antepartum multigravida of advanced maternal age; Prolapse of anterior vaginal wall; and Supervision of high risk pregnancy, antepartum on their problem list.  Patient reports no complaints.  Contractions: Irregular. Vag. Bleeding: None.  Movement: Present. Denies leaking of fluid.   The following portions of the patient's history were reviewed and updated as appropriate: allergies, current medications, past family history, past medical history, past social history, past surgical history and problem list.   Objective:   Vitals:   05/19/23 1329  BP: 115/67  Pulse: 65  Weight: 185 lb (83.9 kg)    Fetal Status: Fetal Heart Rate (bpm): NST   Movement: Present     General:  Alert, oriented and cooperative. Patient is in no acute distress.  Skin: Skin is warm and dry. No rash noted.   Cardiovascular: Normal heart rate noted  Respiratory: Normal respiratory effort, no problems with respiration noted  Abdomen: Soft, gravid, appropriate for gestational age.  Pain/Pressure: Present     Pelvic: Cervical exam performed in the presence of a chaperone        Extremities: Normal range of motion.     Mental Status: Normal mood and affect. Normal behavior. Normal judgment and thought content.   Assessment and Plan:  Pregnancy: G2P1001 at [redacted]w[redacted]d 1. Supervision of high risk pregnancy, antepartum Patient is doing well without complaints Scheduled for IOL on 8/23 Postdate NST reviewed and reactive with a baseline 150, mod variability, + accels, no decels  2. Antepartum multigravida of advanced maternal age   67. Hypothyroidism, unspecified type Continue synthroid  Term labor symptoms and general obstetric precautions including but not limited to vaginal  bleeding, contractions, leaking of fluid and fetal movement were reviewed in detail with the patient. Please refer to After Visit Summary for other counseling recommendations.   No follow-ups on file.  Future Appointments  Date Time Provider Department Center  05/23/2023  6:45 AM [facility] SCHED ROOM [facility] None  07/22/2023 11:00 AM Pollyann Savoy, MD [facility] None    Catalina Antigua, MD

## 2023-05-21 ENCOUNTER — Other Ambulatory Visit: Payer: Self-pay | Admitting: Advanced Practice Midwife

## 2023-05-21 ENCOUNTER — Encounter: Payer: 59 | Admitting: Obstetrics and Gynecology

## 2023-05-22 ENCOUNTER — Inpatient Hospital Stay (HOSPITAL_COMMUNITY)
Admission: AD | Admit: 2023-05-22 | Discharge: 2023-05-23 | DRG: 807 | Disposition: A | Discharge status: Home / Self Care | Payer: 59 | Attending: Obstetrics and Gynecology | Admitting: Obstetrics and Gynecology

## 2023-05-22 ENCOUNTER — Inpatient Hospital Stay (HOSPITAL_COMMUNITY): Payer: 59 | Admitting: Anesthesiology

## 2023-05-22 ENCOUNTER — Other Ambulatory Visit: Payer: Self-pay

## 2023-05-22 ENCOUNTER — Encounter (HOSPITAL_COMMUNITY): Payer: Self-pay | Admitting: Obstetrics and Gynecology

## 2023-05-22 DIAGNOSIS — E039 Hypothyroidism, unspecified: Secondary | ICD-10-CM | POA: Diagnosis present

## 2023-05-22 DIAGNOSIS — Z3A41 41 weeks gestation of pregnancy: Secondary | ICD-10-CM | POA: Diagnosis not present

## 2023-05-22 DIAGNOSIS — Z79890 Hormone replacement therapy (postmenopausal): Secondary | ICD-10-CM | POA: Diagnosis not present

## 2023-05-22 DIAGNOSIS — O99284 Endocrine, nutritional and metabolic diseases complicating childbirth: Secondary | ICD-10-CM | POA: Diagnosis not present

## 2023-05-22 DIAGNOSIS — O09523 Supervision of elderly multigravida, third trimester: Secondary | ICD-10-CM | POA: Diagnosis not present

## 2023-05-22 DIAGNOSIS — O480 Post-term pregnancy: Principal | ICD-10-CM | POA: Diagnosis present

## 2023-05-22 DIAGNOSIS — Z3490 Encounter for supervision of normal pregnancy, unspecified, unspecified trimester: Secondary | ICD-10-CM

## 2023-05-22 LAB — CBC
HCT: 35.7 % — ABNORMAL LOW (ref 36.0–46.0)
Hemoglobin: 12.7 g/dL (ref 12.0–15.0)
MCH: 29.4 pg (ref 26.0–34.0)
MCHC: 35.6 g/dL (ref 30.0–36.0)
MCV: 82.6 fL (ref 80.0–100.0)
Platelets: 160 10*3/uL (ref 150–400)
RBC: 4.32 MIL/uL (ref 3.87–5.11)
RDW: 13.4 % (ref 11.5–15.5)
WBC: 11.6 10*3/uL — ABNORMAL HIGH (ref 4.0–10.5)
nRBC: 0 % (ref 0.0–0.2)

## 2023-05-22 LAB — RPR: RPR Ser Ql: NONREACTIVE

## 2023-05-22 MED ORDER — EPHEDRINE 5 MG/ML INJ: 10.0000 mg | INTRAVENOUS | Status: DC | PRN

## 2023-05-22 MED ORDER — WITCH HAZEL-GLYCERIN EX PADS: 1.0000 | MEDICATED_PAD | CUTANEOUS | Status: DC | PRN

## 2023-05-22 MED ORDER — ACETAMINOPHEN 325 MG PO TABS: 650.0000 mg | ORAL_TABLET | ORAL | Status: DC | PRN

## 2023-05-22 MED ORDER — COCONUT OIL OIL: 1.0000 | TOPICAL_OIL | Status: DC | PRN

## 2023-05-22 MED ORDER — LEVOTHYROXINE SODIUM 25 MCG PO TABS
50.0000 ug | ORAL_TABLET | Freq: Every day | ORAL | Status: DC
  Administered 2023-05-22 – 2023-05-23 (×2): 50 ug via ORAL
  Filled 2023-05-22: qty 2
  Filled 2023-05-22: qty 1
  Filled 2023-05-22: qty 2

## 2023-05-22 MED ORDER — SODIUM CHLORIDE 0.9% FLUSH: 3.0000 mL | INTRAVENOUS | Status: DC | PRN

## 2023-05-22 MED ORDER — FENTANYL CITRATE (PF) 100 MCG/2ML IJ SOLN: INTRAMUSCULAR | Status: DC | PRN

## 2023-05-22 MED ORDER — DIPHENHYDRAMINE HCL 25 MG PO CAPS: 25.0000 mg | ORAL_CAPSULE | Freq: Four times a day (QID) | ORAL | Status: DC | PRN

## 2023-05-22 MED ORDER — LACTATED RINGERS IV SOLN
500.0000 mL | Freq: Once | INTRAVENOUS | Status: AC
  Administered 2023-05-22: 500 mL via INTRAVENOUS

## 2023-05-22 MED ORDER — FENTANYL-BUPIVACAINE-NACL 0.5-0.125-0.9 MG/250ML-% EP SOLN
EPIDURAL | Status: DC | PRN
  Administered 2023-05-22: 12 mL/h via EPIDURAL

## 2023-05-22 MED ORDER — PHENYLEPHRINE 80 MCG/ML (10ML) SYRINGE FOR IV PUSH (FOR BLOOD PRESSURE SUPPORT): 80.0000 ug | PREFILLED_SYRINGE | INTRAVENOUS | Status: DC | PRN

## 2023-05-22 MED ORDER — FENTANYL-BUPIVACAINE-NACL 0.5-0.125-0.9 MG/250ML-% EP SOLN
12.0000 mL/h | EPIDURAL | Status: DC | PRN
  Filled 2023-05-22: qty 250

## 2023-05-22 MED ORDER — SODIUM CHLORIDE 0.9 % IV SOLN: 250.0000 mL | INTRAVENOUS | Status: DC | PRN

## 2023-05-22 MED ORDER — PRENATAL MULTIVITAMIN CH
1.0000 | ORAL_TABLET | Freq: Every day | ORAL | Status: DC
  Administered 2023-05-22 – 2023-05-23 (×2): 1 via ORAL
  Filled 2023-05-22 (×2): qty 1

## 2023-05-22 MED ORDER — FENTANYL CITRATE (PF) 100 MCG/2ML IJ SOLN
INTRAMUSCULAR | Status: AC
  Filled 2023-05-22: qty 2

## 2023-05-22 MED ORDER — SODIUM CHLORIDE 0.9% FLUSH
3.0000 mL | Freq: Two times a day (BID) | INTRAVENOUS | Status: DC
  Administered 2023-05-22 (×2): 3 mL via INTRAVENOUS

## 2023-05-22 MED ORDER — SIMETHICONE 80 MG PO CHEW: 80.0000 mg | CHEWABLE_TABLET | ORAL | Status: DC | PRN

## 2023-05-22 MED ORDER — BENZOCAINE-MENTHOL 20-0.5 % EX AERO
1.0000 | INHALATION_SPRAY | CUTANEOUS | Status: DC | PRN
  Administered 2023-05-22: 1 via TOPICAL
  Filled 2023-05-22: qty 56

## 2023-05-22 MED ORDER — OXYTOCIN-SODIUM CHLORIDE 30-0.9 UT/500ML-% IV SOLN
INTRAVENOUS | Status: AC
  Administered 2023-05-22: 333 mL
  Filled 2023-05-22: qty 500

## 2023-05-22 MED ORDER — BUPIVACAINE HCL (PF) 0.25 % IJ SOLN
INTRAMUSCULAR | Status: DC | PRN
  Administered 2023-05-22: 1.2 mL via INTRATHECAL

## 2023-05-22 MED ORDER — IBUPROFEN 600 MG PO TABS
600.0000 mg | ORAL_TABLET | Freq: Four times a day (QID) | ORAL | Status: DC
  Administered 2023-05-22 – 2023-05-23 (×5): 600 mg via ORAL
  Filled 2023-05-22 (×6): qty 1

## 2023-05-22 MED ORDER — LACTATED RINGERS IV SOLN: INTRAVENOUS | Status: DC

## 2023-05-22 MED ORDER — DIPHENHYDRAMINE HCL 50 MG/ML IJ SOLN: 12.5000 mg | INTRAMUSCULAR | Status: DC | PRN

## 2023-05-22 MED ORDER — FENTANYL CITRATE (PF) 100 MCG/2ML IJ SOLN
INTRAMUSCULAR | Status: DC | PRN
  Administered 2023-05-22: 100 ug via EPIDURAL

## 2023-05-22 MED ORDER — ONDANSETRON HCL 4 MG PO TABS: 4.0000 mg | ORAL_TABLET | ORAL | Status: DC | PRN

## 2023-05-22 MED ORDER — FENTANYL CITRATE (PF) 100 MCG/2ML IJ SOLN: 50.0000 ug | INTRAMUSCULAR | Status: DC | PRN

## 2023-05-22 MED ORDER — SENNOSIDES-DOCUSATE SODIUM 8.6-50 MG PO TABS
2.0000 | ORAL_TABLET | ORAL | Status: DC
  Administered 2023-05-22 – 2023-05-23 (×2): 2 via ORAL
  Filled 2023-05-22 (×2): qty 2

## 2023-05-22 MED ORDER — ONDANSETRON HCL 4 MG/2ML IJ SOLN: 4.0000 mg | INTRAMUSCULAR | Status: DC | PRN

## 2023-05-22 MED ORDER — DIBUCAINE (PERIANAL) 1 % EX OINT: 1.0000 | TOPICAL_OINTMENT | CUTANEOUS | Status: DC | PRN

## 2023-05-22 NOTE — Discharge Summary (Signed)
Postpartum Discharge Summary       Patient Name: Brooke Sherman DOB: [birth year] MRN: [id number]  Date of admission: 05/22/2023 Delivery date:05/22/2023 Delivering provider: Sundra Aland Date of discharge: 05/23/2023  Admitting diagnosis: [redacted] weeks gestation of pregnancy [O48.0, Z3A.41] Intrauterine pregnancy: [redacted]w[redacted]d     Secondary diagnosis:  Principal Problem:   SVD (spontaneous vaginal delivery) Active Problems:   [redacted] weeks gestation of pregnancy  Additional problems:     Discharge diagnosis: Term Pregnancy Delivered                                              Post partum procedures: Augmentation: N/A Complications: None  Hospital course: Onset of Labor With Vaginal Delivery      37 y.o. yo [id number] at [redacted]w[redacted]d was admitted in Latent Labor on 05/22/2023. Labor course was uncomplicated.   Membrane Rupture Time/Date: 4:18 AM,05/22/2023  Delivery Method:Vaginal, Spontaneous Operative Delivery:N/A Episiotomy: None Lacerations:  Labial;1st degree Patient had a postpartum course complicated by none.  She is ambulating, tolerating a regular diet, passing flatus, and urinating well. Patient is discharged home in stable condition on 05/23/23.  Newborn Data: Birth date:05/22/2023 Birth time:4:41 AM Gender:Female Living status:Living Apgars:9 ,9  Weight:3700 g  Magnesium Sulfate received: No BMZ received: No Rhophylac:N/A MMR:N/A T-DaP:Given prenatally Flu: N/A Transfusion:No  Physical exam  Vitals:   05/22/23 1200 05/22/23 1639 05/22/23 2025 05/23/23 0851  BP: 120/67 95/61 121/73 105/70  Pulse: 62 78 65 69  Resp: 17 16 16 16   Temp: 98.1 F (36.7 C) 98 F (36.7 C) 98.2 F (36.8 C) 97.9 F (36.6 C)  TempSrc: Oral Oral Oral Oral  SpO2: 98% 100% 99% 99%  Weight:      Height:       General: alert, cooperative, and no distress Lochia: appropriate Uterine Fundus: firm Incision: N/A DVT Evaluation:  Labs: Lab Results  Component Value Date   WBC 8.8  05/23/2023   HGB 9.7 (L) 05/23/2023   HCT 28.8 (L) 05/23/2023   MCV 84.5 05/23/2023   PLT 160 05/23/2023      Latest Ref Rng & Units 02/20/2022    8:57 AM  CMP  Glucose 65 - 99 mg/dL 80   BUN 7 - 25 mg/dL 15   Creatinine 4.03 - 0.97 mg/dL 4.74   Sodium 259 - 563 mmol/L 139   Potassium 3.5 - 5.3 mmol/L 4.3   Chloride 98 - 110 mmol/L 105   CO2 20 - 32 mmol/L 26   Calcium 8.6 - 10.2 mg/dL 9.7   Total Protein 6.1 - 8.1 g/dL 7.1   Total Bilirubin 0.2 - 1.2 mg/dL 0.7   AST 10 - 30 U/L 18   ALT 6 - 29 U/L 12    Edinburgh Score:    05/23/2023    8:15 AM  Edinburgh Postnatal Depression Scale Screening Tool  I have been able to laugh and see the funny side of things. 0  I have looked forward with enjoyment to things. 0  I have blamed myself unnecessarily when things went wrong. 0  I have been anxious or worried for no good reason. 0  I have felt scared or panicky for no good reason. 0  Things have been getting on top of me. 0  I have been so unhappy that I have had difficulty sleeping. 0  I have felt sad  or miserable. 0  I have been so unhappy that I have been crying. 0  The thought of harming myself has occurred to me. 0  Edinburgh Postnatal Depression Scale Total 0     After visit meds:  Allergies as of 05/23/2023   No Known Allergies      Medication List     TAKE these medications    ibuprofen 600 MG tablet Commonly known as: ADVIL Take 1 tablet (600 mg total) by mouth every 6 (six) hours.   levothyroxine 50 MCG tablet Commonly known as: Synthroid Take 1 tablet (50 mcg total) by mouth daily.   Omron 3 Series BP Monitor Devi Use as directed once a week.   PRENATAL PO Take by mouth daily.         Discharge home in stable condition Infant Feeding: Breast Infant Disposition:home with mother Discharge instruction: per After Visit Summary and Postpartum booklet. Activity: Advance as tolerated. Pelvic rest for 6 weeks.  Diet: routine diet Future  Appointments: Future Appointments  Date Time Provider Department Center  07/03/2023  1:10 PM Warden Fillers, MD [facility] None  07/22/2023 11:00 AM Pollyann Savoy, MD [facility] None   Follow up Visit:  Follow-up Information     [facility] Follow up on 07/03/2023.   Specialty: Obstetrics and Gynecology Why: postpartum visit Contact information: [address], [address] [place] [address] [phone]               Message sent to [facility] 8/22  Please schedule this patient for a In person postpartum visit in 6 weeks with the following provider: Any provider. Additional Postpartum F/U: TSH   High risk pregnancy complicated by:  AMA, hypothyroidism Delivery mode:  Vaginal, Spontaneous Anticipated Birth Control:  IUD   05/23/2023 Lazaro Arms, MD

## 2023-05-22 NOTE — MAU Note (Signed)
..  Brooke Sherman is a 37 y.o. at [redacted]w[redacted]d here in MAU reporting: contractions since 8pm that are now every 3-5 minutes apart. Reports bloody show, denies leaking of fluid. +FM

## 2023-05-22 NOTE — Anesthesia Procedure Notes (Signed)
Epidural Patient location during procedure: OB Start time: 05/22/2023 3:53 AM End time: 05/22/2023 4:03 AM  Staffing Anesthesiologist: Lannie Fields, DO Performed: anesthesiologist   Preanesthetic Checklist Completed: patient identified, IV checked, risks and benefits discussed, monitors and equipment checked, pre-op evaluation and timeout performed  Epidural Patient position: sitting Prep: DuraPrep and site prepped and draped Patient monitoring: continuous pulse ox, blood pressure, heart rate and cardiac monitor Approach: midline Location: L3-L4 Injection technique: LOR air  Needle:  Needle type: Tuohy  Needle gauge: 17 G Needle length: 9 cm Needle insertion depth: 5 cm Catheter type: closed end flexible Catheter size: 19 Gauge Catheter at skin depth: 10 cm Test dose: negative  Assessment Sensory level: T8 Events: blood not aspirated, no cerebrospinal fluid, injection not painful, no injection resistance, no paresthesia and negative IV test  Additional Notes Patient identified. Risks/Benefits/Options discussed with patient including but not limited to bleeding, infection, nerve damage, paralysis, failed block, incomplete pain control, headache, blood pressure changes, nausea, vomiting, reactions to medication both or allergic, itching and postpartum back pain. Confirmed with bedside nurse the patient's most recent platelet count. Confirmed with patient that they are not currently taking any anticoagulation, have any bleeding history or any family history of bleeding disorders. Patient expressed understanding and wished to proceed. All questions were answered. Sterile technique was used throughout the entire procedure. Please see nursing notes for vital signs. Test dose was given through epidural catheter and negative prior to continuing to dose epidural or start infusion. Warning signs of high block given to the patient including shortness of breath, tingling/numbness in  hands, complete motor block, or any concerning symptoms with instructions to call for help. Patient was given instructions on fall risk and not to get out of bed. All questions and concerns addressed with instructions to call with any issues or inadequate analgesia.    Advanced cervical dilation at time of epidural request- CSE performed w/ 24G sprotte, through tuohy, clear CSF, no issues.Reason for block:procedure for pain

## 2023-05-22 NOTE — H&P (Signed)
OBSTETRIC ADMISSION HISTORY AND PHYSICAL  Brooke Sherman is a 37 y.o. female G2P1001 with IUP at [redacted]w[redacted]d by LMP presenting for supervision of labor and delivery. She reports +FMs, No LOF, no VB, no blurry vision, headaches or peripheral edema, and RUQ pain.  She plans on breast feeding. She requests IUD at postpartum visit. She received her prenatal care at [facility]   Dating: By LMP --->  Estimated Date of Delivery: 05/15/23  Sono:    @[redacted]w[redacted]d , CWD, normal anatomy, cephalic presentation, 3081g, 16% EFW   Prenatal History/Complications: AMA Hypothyroidism Prolapse of anterior vaginal wall  Past Medical History: Past Medical History:  Diagnosis Date   Carpal tunnel syndrome    Hypothyroid    Raynaud's disease     Past Surgical History: Past Surgical History:  Procedure Laterality Date   ANTERIOR CRUCIATE LIGAMENT REPAIR Left 2019   WISDOM TOOTH EXTRACTION      Obstetrical History: OB History     Gravida  2   Para  1   Term  1   Preterm      AB      Living  1      SAB      IAB      Ectopic      Multiple  0   Live Births  1           Social History Social History   Socioeconomic History   Marital status: Married    Spouse name: Christiane Ha   Number of children: Not on file   Years of education: Not on file   Highest education level: Not on file  Occupational History   Not on file  Tobacco Use   Smoking status: Never    Passive exposure: Never   Smokeless tobacco: Never  Vaping Use   Vaping status: Never Used  Substance and Sexual Activity   Alcohol use: Not Currently   Drug use: Not Currently   Sexual activity: Yes    Partners: Male    Birth control/protection: None  Other Topics Concern   Not on file  Social History Narrative   Not on file   Social Determinants of Health   Financial Resource Strain: Not on file  Food Insecurity: Not on file  Transportation Needs: Not on file  Physical Activity: Not on file  Stress: Not on  file  Social Connections: Not on file    Family History: Family History  Problem Relation Age of Onset   Cervical cancer Mother     Allergies: No Known Allergies  Medications Prior to Admission  Medication Sig Dispense Refill Last Dose   levothyroxine (SYNTHROID) 50 MCG tablet Take 1 tablet (50 mcg total) by mouth daily. 30 tablet 5 05/21/2023   Prenatal Vit-Fe Fumarate-FA (PRENATAL PO) Take by mouth daily.   05/21/2023   Blood Pressure Monitoring (BLOOD PRESSURE KIT) DEVI Use as directed once a week. 1 each 0      Review of Systems   All systems reviewed and negative except as stated in HPI  Blood pressure 117/73, pulse 65, height 5\' 5"  (1.651 m), weight 85.2 kg, last menstrual period 08/08/2022, SpO2 99%, currently breastfeeding. General appearance: uncomfortable with contractions Lungs: breathing comfortably on room air Heart: regular rate Abdomen: gravid Extremities: mild edema Presentation: cephalic Fetal monitoring:150/mod/+a/-d Uterine activity: every 1-2 min Dilation: 5.5 Effacement (%): 90 Station: -1 Exam by:: A Sherlon Handing RN   Prenatal labs: ABO, Rh: B/Positive/-- (01/09 1096) Antibody: Negative (01/09 0454) Rubella: 8.02 (01/09 0981)  RPR: Non Reactive (05/31 0846)  HBsAg: Negative (01/09 0924)  HIV: Non Reactive (05/31 0846)  GBS: Negative/-- (07/24 1414)  1 hr Glucola: 81 (wnl) Genetic screening: negative Anatomy US: normal anatomy  Prenatal Transfer Tool  Maternal Diabetes: No Genetic Screening: Normal Maternal Ultrasounds/Referrals: Normal Fetal Ultrasounds or other Referrals:  None Maternal Substance Abuse:  No Significant Maternal Medications:  Meds include: Syntroid Significant Maternal Lab Results:  Group B Strep negative Number of Prenatal Visits:greater than 3 verified prenatal visits Other Comments:  None  No results found for this or any previous visit (from the past 24 hour(s)).  Patient Active Problem List   Diagnosis Date Noted    Supervision of high risk pregnancy, antepartum 03/17/2023   Prolapse of anterior vaginal wall 01/07/2023   Antepartum multigravida of advanced maternal age 65/13/2024   Hypothyroidism 05/14/2020    Assessment/Plan:  Brooke Sherman is a 37 y.o. G2P1001 at [redacted]w[redacted]d here for supervision of labor and delivery.  #Labor: Anticipate NSVD. Continue to monitor progress. #Pain: Desires epidural #FWB: Cat I strip, GBS neg. #ID:  GBS neg #MOF: breast #MOC: postpartum IUD #Circ: no  Hanley Ben, Medical Student  05/22/2023, 3:21 AM  Attestation of Supervision of Student:  I confirm that I have verified the information documented in the medical student's note and that I have also personally reperformed the history, physical exam and all medical decision making activities.  I have verified that all services and findings are accurately documented in this student's note; and I agree with management and plan as outlined in the documentation. I have also made any necessary editorial changes.  37 y.o. G2P1001 at [redacted]w[redacted]d, pregnancy complicated by hypothyroidism (well controlled on Synthroid) presented with contractions, rapidly progressed 3cm --> 5.5cm. Rh positive, GBS neg. Appears uncomfortable, requesting epidural. Anticipate NSVD.  Sundra Aland, MD Center for [facility], [facility] 05/22/2023 3:53 AM

## 2023-05-22 NOTE — Lactation Note (Signed)
This note was copied from a baby's chart. Lactation Consultation Note  Patient Name: Brooke Sherman [id number] Date: 05/22/2023 Age:37 years Reason for consult: Initial assessment;Term;Maternal endocrine disorder  Visited with family of 12 hours FT female twice, the first time for initial assessment and the second time for a feeding assist. Brooke Sherman is a P2 and experienced breastfeeding, her husband is a Producer, television/film/video and she would like to get her employee pump through the hospital. Left the pump form for her to look at the options and let lactation know once she decides. Baby already nursing when entered the room the second time (visit) but noticed poor positioning. Broke the latch and she was able to re-latched with ease (see LATCH score). She was still nursing when exiting the room at the 5 minutes mark. Reviewed normal newborn behavior, feeding cues, cluster feeding, size of baby's stomach, lactogenesis II and anticipatory guidelines.  Maternal Data Has patient been taught Hand Expression?: Yes Does the patient have breastfeeding experience prior to this delivery?: Yes How long did the patient breastfeed?: 2 years (he's 2 years and 9 months now :-)  Feeding Mother's Current Feeding Choice: Breast Milk  LATCH Score Latch: Grasps breast easily, tongue down, lips flanged, rhythmical sucking.  Audible Swallowing: A few with stimulation  Type of Nipple: Everted at rest and after stimulation  Comfort (Breast/Nipple): Soft / non-tender  Hold (Positioning): Assistance needed to correctly position infant at breast and maintain latch.  LATCH Score: 37  Interventions Interventions: Breast feeding basics reviewed;Education;LC Services brochure  Plan Encouraged to continue putting baby to breast +8 times/24 hours or sooner if feeding cues are present Breast massage and hand expression were also encouraged prior latching  FOB present and supportive. All questions and concerns answered,  family to contact [facility] services PRN.  Discharge Pump: Personal;Manual (DEBP at home)  Consult Status Consult Status: Follow-up Date: 05/23/23 Follow-up type: In-patient   Brooke Sherman 05/22/2023, 5:22 PM

## 2023-05-22 NOTE — Anesthesia Preprocedure Evaluation (Signed)
Anesthesia Evaluation  Patient identified by MRN, date of birth, ID band Patient awake    Reviewed: Allergy & Precautions, Patient's Chart, lab work & pertinent test results  Airway Mallampati: II  TM Distance: >3 FB Neck ROM: Full    Dental no notable dental hx.    Pulmonary neg pulmonary ROS   Pulmonary exam normal breath sounds clear to auscultation       Cardiovascular negative cardio ROS Normal cardiovascular exam Rhythm:Regular Rate:Normal     Neuro/Psych negative neurological ROS  negative psych ROS   GI/Hepatic negative GI ROS, Neg liver ROS,,,  Endo/Other  Hypothyroidism    Renal/GU negative Renal ROS  negative genitourinary   Musculoskeletal negative musculoskeletal ROS (+)    Abdominal   Peds negative pediatric ROS (+)  Hematology negative hematology ROS (+) Hb 12.7, plt 160   Anesthesia Other Findings   Reproductive/Obstetrics (+) Pregnancy                             Anesthesia Physical Anesthesia Plan  ASA: 2  Anesthesia Plan: Combined Spinal and Epidural   Post-op Pain Management:    Induction:   PONV Risk Score and Plan: 2  Airway Management Planned: Natural Airway  Additional Equipment: None  Intra-op Plan:   Post-operative Plan:   Informed Consent: I have reviewed the patients History and Physical, chart, labs and discussed the procedure including the risks, benefits and alternatives for the proposed anesthesia with the patient or authorized representative who has indicated his/her understanding and acceptance.       Plan Discussed with:   Anesthesia Plan Comments: (9cm dilated upon epidural request- will proceed w/ CSE)       Anesthesia Quick Evaluation

## 2023-05-23 ENCOUNTER — Other Ambulatory Visit (HOSPITAL_COMMUNITY): Payer: Self-pay

## 2023-05-23 ENCOUNTER — Inpatient Hospital Stay (HOSPITAL_COMMUNITY): Payer: 59

## 2023-05-23 ENCOUNTER — Inpatient Hospital Stay (HOSPITAL_COMMUNITY): Admission: AD | Admit: 2023-05-23 | Payer: 59 | Source: Home / Self Care | Admitting: Obstetrics & Gynecology

## 2023-05-23 LAB — CBC
HCT: 28.8 % — ABNORMAL LOW (ref 36.0–46.0)
Hemoglobin: 9.7 g/dL — ABNORMAL LOW (ref 12.0–15.0)
MCH: 28.4 pg (ref 26.0–34.0)
MCHC: 33.7 g/dL (ref 30.0–36.0)
MCV: 84.5 fL (ref 80.0–100.0)
Platelets: 160 10*3/uL (ref 150–400)
RBC: 3.41 MIL/uL — ABNORMAL LOW (ref 3.87–5.11)
RDW: 14 % (ref 11.5–15.5)
WBC: 8.8 10*3/uL (ref 4.0–10.5)
nRBC: 0 % (ref 0.0–0.2)

## 2023-05-23 LAB — TYPE AND SCREEN
ABO/RH(D): B POS
Antibody Screen: NEGATIVE

## 2023-05-23 LAB — SURGICAL PATHOLOGY

## 2023-05-23 MED ORDER — IBUPROFEN 600 MG PO TABS
600.0000 mg | ORAL_TABLET | Freq: Four times a day (QID) | ORAL | 0 refills | Status: DC
  Filled 2023-05-23: qty 30, 8d supply, fill #0

## 2023-05-23 NOTE — Plan of Care (Signed)
Patient to be discharged home with printed instruction. Toya Smothers, RN

## 2023-05-23 NOTE — Lactation Note (Signed)
This note was copied from a baby's chart. Lactation Consultation Note  Patient Name: Brooke Sherman [id number] Date: 05/23/2023 Age:37 hours Reason for consult: Follow-up assessment;Mother's request;Term;Maternal endocrine disorder;Breastfeeding assistance  LC in to assist/assess baby on the breast.  Baby currently at a 4% weight loss with 1 stool at birth, but not since.   Baby in cradle hold, Mom not supporting her breast.  Baby's latch looked good, but could be a deep latch.  Assisted Mom to support her breast in a U hold to sandwich her breast to facilitate a deeper latch.  Baby sucking with deep jaw extensions and swallows.  Taught alternate breast compression to increase milk transfer.  Baby swallowing regularly.  Took baby off after 15 mins and nipple pulled out and not compressed. Baby contented. Mom has been worried about baby's output and that is why she has been supplementing with donor breast milk.  Mom does not want to give formula.  Mom should pump and give her breast milk once she is home.   Engorgement prevention and treatment reviewed.  Encouraged lots of STS, offering the breast often with cues (baby cluster fed all night), if baby is supplemented, Mom should double pump.   LATCH Score Latch: Grasps breast easily, tongue down, lips flanged, rhythmical sucking.  Audible Swallowing: Spontaneous and intermittent  Type of Nipple: Everted at rest and after stimulation  Comfort (Breast/Nipple): Soft / non-tender  Hold (Positioning): Assistance needed to correctly position infant at breast and maintain latch. (Minimal assistance, hand support of breast and learning breast compression)  LATCH Score: 9   Lactation Tools Discussed/Used Tools: Pump;Flanges;Bottle Flange Size: 21 Breast pump type: Double-Electric Breast Pump Pump Education: Setup, frequency, and cleaning;Milk Storage Reason for Pumping: Mom supplementing baby with donor milk Pumping frequency: Hasn't  pumped since last evening due to baby cluster feeding Pumped volume: 4 mL  Interventions Interventions: Breast feeding basics reviewed;Assisted with latch;Skin to skin;Breast massage;Hand express;Breast compression;Support pillows;Position options;Adjust position;DEBP  Discharge Discharge Education: Engorgement and breast care;Outpatient recommendation (Pediatrician office has IBCLC, Jimmye Norman) Pump: Personal;DEBP  Consult Status Consult Status: Complete Date: 05/23/23 Follow-up type: Call as needed    Judee Clara 05/23/2023, 1:06 PM

## 2023-05-23 NOTE — Anesthesia Postprocedure Evaluation (Signed)
Anesthesia Post Note  Patient: Brooke Sherman  Procedure(s) Performed: AN AD HOC LABOR EPIDURAL     Patient location during evaluation: OB High Risk Anesthesia Type: Epidural Level of consciousness: awake, oriented and awake and alert Pain management: pain level controlled Vital Signs Assessment: post-procedure vital signs reviewed and stable Respiratory status: spontaneous breathing, respiratory function stable and nonlabored ventilation Cardiovascular status: stable Postop Assessment: no headache, adequate PO intake, able to ambulate, patient able to bend at knees and no apparent nausea or vomiting Anesthetic complications: no   No notable events documented.  Last Vitals:  Vitals:   05/22/23 2025 05/23/23 0851  BP: 121/73 105/70  Pulse: 65 69  Resp: 16 16  Temp: 36.8 C 36.6 C  SpO2: 99% 99%    Last Pain:  Vitals:   05/23/23 0851  TempSrc: Oral  PainSc:    Pain Goal:                Epidural/Spinal Function Cutaneous sensation: Normal sensation (05/23/23 0815), Patient able to flex knees: Yes (05/23/23 0815), Patient able to lift hips off bed: Yes (05/23/23 0815), Back pain beyond tenderness at insertion site: No (05/23/23 0815), Progressively worsening motor and/or sensory loss: No (05/23/23 0815), Bowel and/or bladder incontinence post epidural: No (05/23/23 0815)  Samual Beals

## 2023-05-27 ENCOUNTER — Other Ambulatory Visit (HOSPITAL_COMMUNITY): Payer: Self-pay

## 2023-05-27 ENCOUNTER — Telehealth: Payer: Self-pay

## 2023-05-27 ENCOUNTER — Ambulatory Visit (INDEPENDENT_AMBULATORY_CARE_PROVIDER_SITE_OTHER): Payer: 59 | Admitting: Obstetrics and Gynecology

## 2023-05-27 ENCOUNTER — Encounter: Payer: Self-pay | Admitting: Obstetrics and Gynecology

## 2023-05-27 MED ORDER — CEFADROXIL 500 MG PO CAPS
500.0000 mg | ORAL_CAPSULE | Freq: Two times a day (BID) | ORAL | 0 refills | Status: AC
Start: 2023-05-27 — End: ?
  Filled 2023-05-27 – 2023-05-28 (×2): qty 14, 7d supply, fill #0

## 2023-05-27 MED ORDER — SULFAMETHOXAZOLE-TRIMETHOPRIM 800-160 MG PO TABS
1.0000 | ORAL_TABLET | Freq: Two times a day (BID) | ORAL | 1 refills | Status: DC
  Filled 2023-05-27: qty 14, 7d supply, fill #0

## 2023-05-27 NOTE — Telephone Encounter (Signed)
Patient called stating that she is having really bad pain from her lacerations. She states that the pain makes it hard for her to walk or sit sometime. She is taking the ibuprofen and tylenol with relief. States that the pain has not gotten any better since leaving the hospital. She has also used the derma plast spray, but states that the spray burns. She has also stopped using ice packs about 2 days ago she used her last one from the hospital. Patient would like to have the area looked at because she has noticed some swelling and sharp pains in the area.  Patient transferred to front desk to schedule appt

## 2023-05-27 NOTE — Progress Notes (Signed)
37 y.o. GYN, 5 days PP SVD presents for FU of 1st degree laceration.  C/o Tylenol nor Ibuprofen is working for the pain.

## 2023-05-27 NOTE — Progress Notes (Signed)
   POSTPARTUM PROBLEM VISIT   Subjective:  Brooke Sherman is a 37 y.o. [id number] who is 5d PP presenting for laceration check.   Pt's delivery was complicated by a sulcal laceration, 1st degree and a right labial laceration. Pt reports pain is just getting worse since she was discharged from the hospital and has limited relief with tylenol and ibuprofen. Had issues with her laceration healing last pregnancy, too. Is worried she may an allergy/sensitivity to suture because she had an issue with suture in her knee in the past, too. Has not started sitz baths. No n/v/f//HVB.  She is breastfeeding, delivered at term.  Objective:   Vitals:   05/27/23 1440  BP: 123/73  Pulse: 60  Weight: 172 lb (78 kg)  Height: 5\' 5"  (1.651 m)    General:  Alert, oriented and cooperative. Patient is in no acute distress.  Skin: Skin is warm and dry. No rash noted.   Cardiovascular: Normal heart rate noted  Respiratory: Normal respiratory effort, no problems with respiration noted  Abdomen: Soft, non-tender, non-distended   Pelvic: Superficial separation of the right labial laceration and sulcal laceration.  Diffusely tender, mildly erythematous, and a small area of drainage noted at the level of the hymen.  The area was sprayed with benzocaine. Dissolving/extraneous suture material was trimmed and removed without issue. No palpable hematoma or abscess.   Exam performed in the presence of a chaperone  Assessment and Plan:  Brooke Sherman is a 37 y.o. presenting for laceration follow up with potential mild cellulitis  - Extraneous suture removed as outlined above. Discussed plan for healing via secondary intention - Continue tylenol/ibuprofen with benzocaine spray prn - Sitz baths at bedtime - Plan for cefadroxil x 7d. If symptoms are worsening/fail to improve will transition to bactrim for MRSA coverage - Follow up in 1 week for laceration check -     cefadroxil (DURICEF) 500 MG  capsule; Take 1 capsule (500 mg total) by mouth 2 (two) times daily.  Return in about 1 week (around 06/03/2023) for laceration check.  Future Appointments  Date Time Provider Department Center  06/03/2023  2:30 PM Lennart Pall, MD [facility] None  07/02/2023  3:50 PM Lennart Pall, MD [facility] None  07/22/2023 11:00 AM Pollyann Savoy, MD [facility] None   Lennart Pall, MD

## 2023-05-28 ENCOUNTER — Other Ambulatory Visit (HOSPITAL_COMMUNITY): Payer: Self-pay

## 2023-06-03 ENCOUNTER — Ambulatory Visit (INDEPENDENT_AMBULATORY_CARE_PROVIDER_SITE_OTHER): Payer: 59 | Admitting: Obstetrics and Gynecology

## 2023-06-03 NOTE — Progress Notes (Signed)
   POSTPARTUM PROBLEM VISIT   Subjective:  Brooke Sherman is a 37 y.o. [id number] who is 2wk PP presenting for laceration check.   Had sulcal & R labial lacs. Seen last week for worsening pain and on exam appeared to have a mild cellulitis of the laceration. Any dissolving/extraneous suture was removed and patient was started on duricef.   Today, pt reports significant improvement in her pain after 48h of abx. Is doing well overall. Doing sitz baths nightly, has a few pills left of her antibiotics.   Notes more pelvic pressure after a long day of standing yesterday  She is breastfeeding, delivered at term.  Objective:   Vitals:   06/03/23 1438  BP: 123/75  Pulse: 60  Weight: 168 lb (76.2 kg)    General:  Alert, oriented and cooperative. Patient is in no acute distress.  Skin: Skin is warm and dry. No rash noted.   Cardiovascular: Normal heart rate noted  Respiratory: Normal respiratory effort, no problems with respiration noted  Abdomen: Soft, non-tender, non-distended   Pelvic: Right labial laceration has epithelialized in the gap from last exam. Perineal/sulcal lac well approximated and healing well.   Exam performed in the presence of a chaperone  Assessment and Plan:  Brooke Sherman is a 37 y.o. presenting for laceration follow up, healing well  - Complete duricef (total 7d course) - Continue nightly sitz baths x 2 weeks  - Discussed possibility of persistent granulation tissue and ability to cauterize with  - Follow up for routine postpartum care or sooner as needed  Return in 29 days (on 07/02/2023) for routine postpartum visit or sooner as needed.  Future Appointments  Date Time Provider Department Center  07/02/2023  3:50 PM Lennart Pall, MD [facility] None  07/22/2023 11:00 AM Pollyann Savoy, MD [facility] None   Lennart Pall, MD

## 2023-06-09 ENCOUNTER — Other Ambulatory Visit (HOSPITAL_COMMUNITY): Payer: Self-pay

## 2023-06-10 ENCOUNTER — Encounter: Payer: Self-pay | Admitting: Obstetrics and Gynecology

## 2023-07-02 ENCOUNTER — Ambulatory Visit: Payer: 59 | Admitting: Obstetrics and Gynecology

## 2023-07-02 ENCOUNTER — Encounter: Payer: Self-pay | Admitting: Obstetrics and Gynecology

## 2023-07-02 NOTE — Progress Notes (Signed)
Post Partum Visit Note  Brooke Sherman is a 37 y.o. [id number] female who presents for a postpartum visit. She is 6 weeks postpartum following a normal spontaneous vaginal delivery.  I have fully reviewed the prenatal and intrapartum course. The delivery was at 41.0 gestational weeks.  Anesthesia: epidural. Postpartum course notable for cellulitis of perineal repair - symptoms have since resolved; just has slightly different odor that she did not expect. Baby is doing well. Baby is feeding by breast. Bleeding no bleeding. Bowel function is normal. Bladder function is normal. Patient is not sexually active. Contraception method is none. Postpartum depression screening: negative  Notes her carpal tunnel returning likely d/t breastfeeding. Has braces she can wear.  Having dryness in her fingers in carpal tunnel distribution   Upstream - 07/02/23 1555       Pregnancy Intention Screening   Does the patient want to become pregnant in the next year? No    Does the patient's partner want to become pregnant in the next year? No    Would the patient like to discuss contraceptive options today? No      Contraception Wrap Up   Current Method Abstinence    End Method Abstinence    Contraception Counseling Provided No    How was the end contraceptive method provided? N/A            The pregnancy intention screening data noted above was reviewed. Potential methods of contraception were discussed. The patient elected to proceed with Abstinence.   Edinburgh Postnatal Depression Scale - 07/02/23 1554       Edinburgh Postnatal Depression Scale:  In the Past 7 Days   I have been able to laugh and see the funny side of things. 0    I have looked forward with enjoyment to things. 0    I have blamed myself unnecessarily when things went wrong. 0    I have been anxious or worried for no good reason. 0    I have felt scared or panicky for no good reason. 0    Things have been getting on  top of me. 0    I have been so unhappy that I have had difficulty sleeping. 0    I have felt sad or miserable. 0    I have been so unhappy that I have been crying. 0    The thought of harming myself has occurred to me. 0    Edinburgh Postnatal Depression Scale Total 0             Health Maintenance Due  Topic Date Due   COVID-19 Vaccine (6 - 2023-24 season) 06/01/2023    The following portions of the patient's history were reviewed and updated as appropriate: allergies, current medications, past family history, past medical history, past social history, past surgical history, and problem list.  Review of Systems Pertinent items are noted in HPI.  Objective:  BP 112/70   Pulse (!) 57   Ht 5\' 5"  (1.651 m)   Wt 166 lb (75.3 kg)   LMP 08/08/2022 (Exact Date)   Breastfeeding Yes   BMI 27.62 kg/m    General:  alert, cooperative, and no distress   Breasts:  not indicated  Lungs: Normal effort  Heart:  Normal peripheral perfusion  Abdomen: Soft, nontender   GU exam:   deferred       Assessment:   Normal postpartum exam.   Plan:   Essential components of care per ACOG  recommendations:  1.  Mood and well being: Patient with negative depression screening today. Reviewed local resources for support.  - Patient tobacco use? No.   - hx of drug use? No.    2. Infant care and feeding:  -Patient currently breastmilk feeding? Yes. Reviewed importance of draining breast regularly to support lactation.  -Social determinants of health (SDOH) reviewed in EPIC. No concerns  3. Sexuality, contraception and birth spacing - Reviewed reproductive life planning. Reviewed contraceptive methods based on pt preferences and effectiveness.  Patient desired Abstinence today.  Is considering IUD but will let us know if/when she wants it placed - Discussed birth spacing of 18 months  4. Sleep and fatigue -Encouraged family/partner/community support of 4 hrs of uninterrupted sleep to help  with mood and fatigue  5. Physical Recovery  - Discussed patients delivery and complications. She describes her labor as mixed. L&D was super full so she actively labored in MAU. Couldn't get epidural until she was 9.5 centimeters. Overall everything went OK though.  - Patient had a Vaginal, no problems at delivery. Patient had a 2nd degree laceration. Perineal healing reviewed. Patient expressed understanding - Patient has urinary incontinence? No. - Patient is safe to resume physical and sexual activity  6.  Health Maintenance - HM due items addressed Yes - Last pap smear  Diagnosis  Date Value Ref Range Status  11/12/2022   Final   - Negative for intraepithelial lesion or malignancy (NILM)   Pap smear not done at today's visit.  -Breast Cancer screening indicated? No.   7. Chronic Disease/Pregnancy Condition follow up: Hypothyroidism - PCP follow up   Center for [employer], [facility]

## 2023-07-03 ENCOUNTER — Ambulatory Visit: Payer: 59 | Admitting: Obstetrics and Gynecology

## 2023-07-09 NOTE — Progress Notes (Signed)
Office Visit Note  Patient: Brooke Sherman             Date of Birth: 03-07-1986           MRN: [id number]             PCP: Etta Grandchild, MD Referring: Etta Grandchild, MD Visit Date: 07/22/2023 Occupation: @GUAROCC @  Subjective:  Left knee pain and bilateral carpal tunnel    History of Present Illness: Brooke Sherman is a 37 y.o. female with Raynauds and positive ANA.  She returns today after her last visit in December 2023.  She is 2 months postpartum now.  She states she had no complications during the pregnancy.  She had carpal tunnel syndrome symptoms with her prior pregnancy and the symptoms have come back now.  She states she has been having bilateral hand paresthesias more prominent in her right hand.  She is also noticed peeling of the skin in her bilateral first 3 fingers.  Raynauds has been stable.  There is no history of fatigue, dry mouth, dry eyes, inflammatory arthritis or photosensitivity.  She states she notices some bumps under her tongue which happens twice a year and associated with lymph node enlargement.  She has not noticed oral ulcers.  Has been experiencing some left knee joint discomfort.  She had arthroscopic surgery on the left knee in the past.  She is associating the knee joint discomfort with the weight gain during the pregnancy.  She has not noticed any inflammation.    Activities of Daily Living:  Patient reports morning stiffness for 0 minutes.   Patient Reports nocturnal pain.  Difficulty dressing/grooming: Denies Difficulty climbing stairs: Denies Difficulty getting out of chair: Denies Difficulty using hands for taps, buttons, cutlery, and/or writing: Denies  Review of Systems  Constitutional:  Negative for fatigue.  HENT:  Negative for mouth sores and mouth dryness.   Eyes:  Negative for dryness.  Respiratory:  Negative for shortness of breath.   Cardiovascular:  Negative for chest pain and palpitations.   Gastrointestinal:  Negative for blood in stool, constipation and diarrhea.  Endocrine: Negative for increased urination.  Genitourinary:  Negative for involuntary urination.  Musculoskeletal:  Positive for joint pain and joint pain. Negative for gait problem, joint swelling, myalgias, muscle weakness, morning stiffness, muscle tenderness and myalgias.  Skin:  Positive for color change. Negative for rash, hair loss and sensitivity to sunlight.  Allergic/Immunologic: Negative for susceptible to infections.  Neurological:  Positive for numbness. Negative for dizziness and headaches.  Hematological:  Negative for swollen glands.  Psychiatric/Behavioral:  Negative for depressed mood and sleep disturbance. The patient is not nervous/anxious.     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    Past Medical History:  Diagnosis Date   Carpal tunnel syndrome    Hypothyroid    Raynaud's disease     Family History  Problem Relation Age of Onset   Cervical cancer Mother    Healthy Son    Healthy Daughter    Past Surgical History:  Procedure Laterality Date   ANTERIOR CRUCIATE LIGAMENT REPAIR Left 2019   WISDOM TOOTH EXTRACTION     Social History   Social History Narrative   Not on file   Immunization History  Administered Date(s) Administered   Influenza, Quadrivalent, Recombinant, Inj, Pf 07/02/2022   Influenza,inj,Quad PF,6+ Mos 06/12/2020   Influenza-Unspecified 07/11/2021   PFIZER  Comirnaty(Gray Top)Covid-19 Tri-Sucrose Vaccine 07/01/2022   Pfizer Covid-19 Vaccine Bivalent Booster 20yrs & up 07/11/2021   Tdap 06/12/2020, 02/28/2023   Unspecified SARS-COV-2 Vaccination 12/12/2019, 01/09/2020, 09/06/2020     Objective: Vital Signs: BP 105/75 (BP Location: Left Arm, Patient Position: Sitting, Cuff Size: Normal)   Pulse 76   Resp 14   Ht 5\' 5"  (1.651 m)   Wt 167 lb 3.2 oz (75.8 kg)   LMP 08/08/2022 (Exact  Date)   Breastfeeding Yes   BMI 27.82 kg/m    Physical Exam Vitals and nursing note reviewed.  Constitutional:      Appearance: She is well-developed.  HENT:     Head: Normocephalic and atraumatic.  Eyes:     Conjunctiva/sclera: Conjunctivae normal.  Cardiovascular:     Rate and Rhythm: Normal rate and regular rhythm.     Heart sounds: Normal heart sounds.  Pulmonary:     Effort: Pulmonary effort is normal.     Breath sounds: Normal breath sounds.  Abdominal:     General: Bowel sounds are normal.     Palpations: Abdomen is soft.  Musculoskeletal:     Cervical back: Normal range of motion.  Lymphadenopathy:     Cervical: No cervical adenopathy.  Skin:    General: Skin is warm and dry.     Capillary Refill: Capillary refill takes less than 2 seconds.  Neurological:     Mental Status: She is alert and oriented to person, place, and time.  Psychiatric:        Behavior: Behavior normal.      Musculoskeletal Exam: Cervical spine was in good range of motion.  She has some stiffness with right lateral rotation.  Shoulder joints, elbow joints, wrist joints, MCPs PIPs and DIPs in good range of motion with no synovitis.  Hip joints, knee joints, ankles, MTPs and PIPs were in good range of motion with no synovitis.  Manual compression and Tinel's were negative but Phalen's was positive.  CDAI Exam: CDAI Score: -- Patient Global: --; Provider Global: -- Swollen: --; Tender: -- Joint Exam 07/22/2023   No joint exam has been documented for this visit   There is currently no information documented on the homunculus. Go to the Rheumatology activity and complete the homunculus joint exam.  Investigation: No additional findings.  Imaging: No results found.  Recent Labs: Lab Results  Component Value Date   WBC 8.8 05/23/2023   HGB 9.7 (L) 05/23/2023   PLT 160 05/23/2023   NA 139 02/20/2022   K 4.3 02/20/2022   CL 105 02/20/2022   CO2 26 02/20/2022   GLUCOSE 80 02/20/2022    BUN 15 02/20/2022   CREATININE 0.64 02/20/2022   BILITOT 0.7 02/20/2022   AST 18 02/20/2022   ALT 12 02/20/2022   PROT 7.1 02/20/2022   CALCIUM 9.7 02/20/2022    Speciality Comments: No specialty comments available.  Procedures:  No procedures performed Allergies: Patient has no known allergies.   Assessment / Plan:     Visit Diagnoses: Pain in right wrist -patient states that right wrist joint pain has resolved now.  She had MRI by Dr. Melvyn Novas of her wrist joint which showed mild degenerative changes and interstitial fraying of the ulnar attachment of the TFCC.  Primary osteoarthritis of both hands-she had bilateral mild PIP and DIP thickening.  She continues to have some stiffness in her hands.  No synovitis was noted.  Bilateral carpal tunnel syndrome-patient had recurrence of carpal tunnel syndrome symptoms in her  third trimester.  She continues to have discomfort in her hands due to carpal tunnel syndrome as she is nursing.  She has been using carpal tunnel braces at nighttime.  I advised her to contact me if her symptoms do not improve.  She states with the previous pregnancy after she stopped nursing the symptoms resolved by itself.  A handout on carpal tunnel syndrome was provided.  Chronic pain of left knee-she has chronic intermittent discomfort in her left knee joint.  She had previous arthroscopic surgery.  She has been experiencing more discomfort in her left knees because of weight gain per patient.  She has been doing a bike at home.  A handout on exercises was given.  Raynaud's syndrome without gangrene-she has mild Raynaud's symptoms which usually occur during fall and winter.  She does not require any additional medications.  She had good capillary refill without any sclerodactyly or nailbed capillary changes.  Positive ANA (antinuclear antibody) - Feb 20, 2022 WBC 3.7, ANA 1: 40NS, ENA negative, C3 94, C4 11. -She had low titer ANA in the past.  I will recheck labs  today.  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  Patient has intermittent thoracic radiculopathy per patient.  History of hypothyroidism  Orders: Orders Placed This Encounter  Procedures   Protein / creatinine ratio, urine   CBC with Differential/Platelet   COMPLETE METABOLIC PANEL WITH GFR   Anti-DNA antibody, double-stranded   C3 and C4   Sedimentation rate   ANA   No orders of the defined types were placed in this encounter.    Follow-Up Instructions: Return in about 1 year (around 07/21/2024) for +ANA, Raynauds.   Pollyann Savoy, MD  Note - This record has been created using Animal nutritionist.  Chart creation errors have been sought, but may not always  have been located. Such creation errors do not reflect on  the standard of medical care.

## 2023-07-22 ENCOUNTER — Encounter: Payer: Self-pay | Admitting: Rheumatology

## 2023-07-22 ENCOUNTER — Ambulatory Visit: Payer: 59 | Attending: Rheumatology | Admitting: Rheumatology

## 2023-07-22 VITALS — BP 105/75 | HR 76 | Resp 14 | Ht 65.0 in | Wt 167.2 lb

## 2023-07-22 DIAGNOSIS — M25562 Pain in left knee: Secondary | ICD-10-CM

## 2023-07-22 DIAGNOSIS — G5603 Carpal tunnel syndrome, bilateral upper limbs: Secondary | ICD-10-CM | POA: Diagnosis not present

## 2023-07-22 DIAGNOSIS — G8929 Other chronic pain: Secondary | ICD-10-CM

## 2023-07-22 DIAGNOSIS — M19042 Primary osteoarthritis, left hand: Secondary | ICD-10-CM

## 2023-07-22 DIAGNOSIS — R768 Other specified abnormal immunological findings in serum: Secondary | ICD-10-CM

## 2023-07-22 DIAGNOSIS — Z8639 Personal history of other endocrine, nutritional and metabolic disease: Secondary | ICD-10-CM | POA: Diagnosis not present

## 2023-07-22 DIAGNOSIS — I7300 Raynaud's syndrome without gangrene: Secondary | ICD-10-CM | POA: Diagnosis not present

## 2023-07-22 DIAGNOSIS — M25531 Pain in right wrist: Secondary | ICD-10-CM

## 2023-07-22 DIAGNOSIS — M5124 Other intervertebral disc displacement, thoracic region: Secondary | ICD-10-CM

## 2023-07-22 DIAGNOSIS — M19041 Primary osteoarthritis, right hand: Secondary | ICD-10-CM | POA: Diagnosis not present

## 2023-07-22 DIAGNOSIS — M5414 Radiculopathy, thoracic region: Secondary | ICD-10-CM

## 2023-07-22 NOTE — Patient Instructions (Signed)
Carpal Tunnel Syndrome  Carpal tunnel syndrome is a condition that causes pain, numbness, and weakness in your hand and fingers. The carpal tunnel is a narrow area located on the palm side of your wrist. Repeated wrist motion or certain diseases may cause swelling within the tunnel. This swelling pinches the main nerve in the wrist. The main nerve in the wrist is called the median nerve. What are the causes? This condition may be caused by: Repeated and forceful wrist and hand motions. Wrist injuries. Arthritis. A cyst or tumor in the carpal tunnel. Fluid buildup during pregnancy. Use of tools that vibrate. Sometimes the cause of this condition is not known. What increases the risk? The following factors may make you more likely to develop this condition: Having a job that requires you to repeatedly or forcefully move your wrist or hand or requires you to use tools that vibrate. This may include jobs that involve using computers, working on an [employer], or working with power tools such as Radiographer, therapeutic. Being a woman. Having certain conditions, such as: Diabetes. Obesity. An underactive thyroid (hypothyroidism). Kidney failure. Rheumatoid arthritis. What are the signs or symptoms? Symptoms of this condition include: A tingling feeling in your fingers, especially in your thumb, index, and middle fingers. Tingling or numbness in your hand. An aching feeling in your entire arm, especially when your wrist and elbow are bent for a long time. Wrist pain that goes up your arm to your shoulder. Pain that goes down into your palm or fingers. A weak feeling in your hands. You may have trouble grabbing and holding items. Your symptoms may feel worse during the night. How is this diagnosed? This condition is diagnosed with a medical history and physical exam. You may also have tests, including: Electromyogram (EMG). This test measures electrical signals sent by your nerves into the  muscles. Nerve conduction study. This test measures how well electrical signals pass through your nerves. Imaging tests, such as X-rays, ultrasound, and MRI. These tests check for possible causes of your condition. How is this treated? This condition may be treated with: Lifestyle changes. It is important to stop or change the activity that caused your condition. Doing exercise and activities to strengthen and stretch your muscles and tendons (physical therapy). Making lifestyle changes to help with your condition and learning how to do your daily activities safely (occupational therapy). Medicines for pain and inflammation. This may include medicine that is injected into your wrist. A wrist splint or brace. Surgery. Follow these instructions at home: If you have a splint or brace: Wear the splint or brace as told by your health care provider. Remove it only as told by your health care provider. Loosen the splint or brace if your fingers tingle, become numb, or turn cold and blue. Keep the splint or brace clean. If the splint or brace is not waterproof: Do not let it get wet. Cover it with a watertight covering when you take a bath or shower. Managing pain, stiffness, and swelling If directed, put ice on the painful area. To do this: If you have a removeable splint or brace, remove it as told by your health care provider. Put ice in a plastic bag. Place a towel between your skin and the bag or between the splint or brace and the bag. Leave the ice on for 20 minutes, 2-3 times a day. Do not fall asleep with the cold pack on your skin. Remove the ice if your skin turns  bright red. This is very important. If you cannot feel pain, heat, or cold, you have a greater risk of damage to the area. Move your fingers often to reduce stiffness and swelling. General instructions Take over-the-counter and prescription medicines only as told by your health care provider. Rest your wrist and hand from  any activity that may be causing your pain. If your condition is work related, talk with your employer about changes that can be made, such as getting a wrist pad to use while typing. Do any exercises as told by your health care provider, physical therapist, or occupational therapist. Keep all follow-up visits. This is important. Contact a health care provider if: You have new symptoms. Your pain is not controlled with medicines. Your symptoms get worse. Get help right away if: You have severe numbness or tingling in your wrist or hand. Summary Carpal tunnel syndrome is a condition that causes pain, numbness, and weakness in your hand and fingers. It is usually caused by repeated wrist motions. Lifestyle changes and medicines are used to treat carpal tunnel syndrome. Surgery may be recommended. Follow your health care provider's instructions about wearing a splint, resting from activity, keeping follow-up visits, and calling for help. This information is not intended to replace advice given to you by your health care provider. Make sure you discuss any questions you have with your health care provider.  Exercises for Chronic Knee Pain Chronic knee pain is pain that lasts longer than 3 months. For most people with chronic knee pain, exercise and weight loss is an important part of treatment. Your health care provider may want you to focus on: Making the muscles that support your knee stronger. This can take pressure off your knee and reduce pain. Preventing knee stiffness. How far you can move your knee, keeping it there or making it farther. Losing weight (if this applies) to take pressure off your knee, lower your risk for injury, and make it easier for you to exercise. Your provider will help you make an exercise program that fits your needs and physical abilities. Below are simple, low-impact exercises you can do at home. Ask your provider or physical therapist how often you should do your  exercise program and how many times to repeat each exercise. General safety tips  Get your provider's approval before doing any exercises. Start slowly and stop any time you feel pain. Do not exercise if your knee pain is flaring up. Warm up first. Stretching a cold muscle can cause an injury. Do 5-10 minutes of easy movement or light stretching before beginning your exercises. Do 5-10 minutes of low-impact activity (like walking or cycling) before starting strengthening exercises. Contact your provider any time you have pain during or after exercising. Exercise can cause discomfort but should not be painful. It is normal to be a little stiff or sore after exercising. Stretching and range-of-motion exercises Front thigh stretch  Stand up straight and support your body by holding on to a chair or resting one hand on a wall. With your legs straight and close together, bend one knee to lift your heel up toward your butt. Using one hand for support, grab your ankle with your free hand. Pull your foot up closer toward your butt to feel the stretch in front of your thigh. Hold the stretch for 30 seconds. Repeat __________ times. Complete this exercise __________ times a day. Back thigh stretch  Sit on the floor with your back straight and your legs out straight  in front of you. Place the palms of your hands on the floor and slide them toward your feet as you bend at the hip. Try to touch your nose to your knees and feel the stretch in the back of your thighs. Hold for 30 seconds. Repeat __________ times. Complete this exercise __________ times a day. Calf stretch  Stand facing a wall. Place the palms of your hands flat against the wall, arms extended, and lean slightly against the wall. Get into a lunge position with one leg bent at the knee and the other leg stretched out straight behind you. Keep both feet facing the wall and increase the bend in your knee while keeping the heel of the  other leg flat on the ground. You should feel the stretch in your calf. Hold for 30 seconds. Repeat __________ times. Complete this exercise __________ times a day. Strengthening exercises Straight leg lift  Lie on your back with one knee bent and the other leg out straight. Slowly lift the straight leg without bending the knee. Lift until your foot is about 12 inches (30 cm) off the floor. Hold for 3-5 seconds and slowly lower your leg. Repeat __________ times. Complete this exercise __________ times a day. Single leg dip  Stand between two chairs and put both hands on the backs of the chairs for support. Extend one leg out straight with your body weight resting on the heel of the standing leg. Slowly bend your standing knee to dip your body to the level that is comfortable for you. Hold for 3-5 seconds. Repeat __________ times. Complete this exercise __________ times a day. Hamstring curls  Stand straight, knees close together, facing the back of a chair. Hold on to the back of a chair with both hands. Keep one leg straight. Bend the other knee while bringing the heel up toward the butt until the knee is bent at a 90-degree angle (right angle). Hold for 3-5 seconds. Repeat __________ times. Complete this exercise __________ times a day. Wall squat  Stand straight with your back, hips, and head against a wall. Step forward one foot at a time with your back still against the wall. Your feet should be 2 feet (61 cm) from the wall at shoulder width. Keeping your back, hips, and head against the wall, slide down the wall to as close to a sitting position as you can get. Hold for 5-10 seconds, then slowly slide back up. Repeat __________ times. Complete this exercise __________ times a day. Step-ups  Stand in front of a sturdy platform or stool that is about 6 inches (15 cm) high. Slowly step up with your left / right foot, keeping your knee in line with your hip and foot. Do not let  your knee bend so far that you cannot see your toes. Hold on to a chair for balance, but do not use it for support. Slowly unlock your knee and lower yourself to the starting position. Repeat __________ times. Complete this exercise __________ times a day. Contact a health care provider if: Your exercises cause pain. Your pain is worse after you exercise. Your pain prevents you from doing your exercises. This information is not intended to replace advice given to you by your health care provider. Make sure you discuss any questions you have with your health care provider. Document Revised: 10/01/2022 Document Reviewed: 10/01/2022 Elsevier Patient Education  2024 Elsevier Inc.  Document Revised: 01/27/2020 Document Reviewed: 01/27/2020 Elsevier Patient Education  2024 [employer].

## 2023-07-23 ENCOUNTER — Other Ambulatory Visit (HOSPITAL_COMMUNITY): Payer: Self-pay

## 2023-07-24 LAB — CBC WITH DIFFERENTIAL/PLATELET
Absolute Lymphocytes: 1263 {cells}/uL (ref 850–3900)
Absolute Monocytes: 381 {cells}/uL (ref 200–950)
Basophils Absolute: 49 {cells}/uL (ref 0–200)
Basophils Relative: 1.2 %
Eosinophils Absolute: 180 {cells}/uL (ref 15–500)
Eosinophils Relative: 4.4 %
HCT: 40 % (ref 35.0–45.0)
Hemoglobin: 13 g/dL (ref 11.7–15.5)
MCH: 26.6 pg — ABNORMAL LOW (ref 27.0–33.0)
MCHC: 32.5 g/dL (ref 32.0–36.0)
MCV: 82 fL (ref 80.0–100.0)
MPV: 10.9 fL (ref 7.5–12.5)
Monocytes Relative: 9.3 %
Neutro Abs: 2226 {cells}/uL (ref 1500–7800)
Neutrophils Relative %: 54.3 %
Platelets: 284 10*3/uL (ref 140–400)
RBC: 4.88 10*6/uL (ref 3.80–5.10)
RDW: 11.9 % (ref 11.0–15.0)
Total Lymphocyte: 30.8 %
WBC: 4.1 10*3/uL (ref 3.8–10.8)

## 2023-07-24 LAB — ANTI-NUCLEAR AB-TITER (ANA TITER): ANA Titer 1: 1:80 {titer} — ABNORMAL HIGH

## 2023-07-24 LAB — COMPLETE METABOLIC PANEL WITH GFR
AG Ratio: 1.6 (calc) (ref 1.0–2.5)
ALT: 36 U/L — ABNORMAL HIGH (ref 6–29)
AST: 24 U/L (ref 10–30)
Albumin: 4.6 g/dL (ref 3.6–5.1)
Alkaline phosphatase (APISO): 71 U/L (ref 31–125)
BUN: 15 mg/dL (ref 7–25)
CO2: 27 mmol/L (ref 20–32)
Calcium: 10.3 mg/dL — ABNORMAL HIGH (ref 8.6–10.2)
Chloride: 103 mmol/L (ref 98–110)
Creat: 0.72 mg/dL (ref 0.50–0.97)
Globulin: 2.9 g/dL (ref 1.9–3.7)
Glucose, Bld: 88 mg/dL (ref 65–99)
Potassium: 4.5 mmol/L (ref 3.5–5.3)
Sodium: 140 mmol/L (ref 135–146)
Total Bilirubin: 0.3 mg/dL (ref 0.2–1.2)
Total Protein: 7.5 g/dL (ref 6.1–8.1)
eGFR: 111 mL/min/{1.73_m2} (ref >=60)

## 2023-07-24 LAB — PROTEIN / CREATININE RATIO, URINE
Creatinine, Urine: 46 mg/dL (ref 20–275)
Total Protein, Urine: <4 mg/dL — ABNORMAL LOW (ref 5–24)

## 2023-07-24 LAB — ANTI-DNA ANTIBODY, DOUBLE-STRANDED: ds DNA Ab: 1 [IU]/mL

## 2023-07-24 LAB — C3 AND C4
C3 Complement: 140 mg/dL (ref 83–193)
C4 Complement: 19 mg/dL (ref 15–57)

## 2023-07-24 LAB — SEDIMENTATION RATE: Sed Rate: 11 mm/h (ref 0–20)

## 2023-07-24 LAB — ANA: Anti Nuclear Antibody (ANA): POSITIVE — AB

## 2023-07-25 NOTE — Progress Notes (Signed)
Urine protein creatinine ratio normal, CBC normal, CMP shows mildly elevated calcium.  Patient should avoid calcium supplement.  Liver function is mildly elevated.  Patient should avoid NSAIDs.  ANA is low titer positive and not significant.  Double-stranded ENA negative, complements normal, sed rate normal.

## 2023-07-28 ENCOUNTER — Encounter: Payer: Self-pay | Admitting: Internal Medicine

## 2023-07-28 ENCOUNTER — Encounter: Payer: Self-pay | Admitting: Rheumatology

## 2023-07-28 ENCOUNTER — Encounter: Payer: Self-pay | Admitting: Obstetrics and Gynecology

## 2023-07-28 NOTE — Telephone Encounter (Signed)
I returned patient's call.  I advised her to avoid calcium intake.  She stated that she will check if she is taking any calcium with her multivitamin.  She was also advised to increase water intake.  She stated she was taking ibuprofen after the childbirth.  She is also gained some weight.  I advised her to avoid NSAIDs.  She will have repeat labs with her PCP in 2 months.  Please forward a copy of her labs to her PCP.

## 2023-07-28 NOTE — Telephone Encounter (Signed)
Copy of labs sent to PCP. 

## 2023-08-04 ENCOUNTER — Telehealth: Payer: Self-pay | Admitting: Internal Medicine

## 2023-08-04 ENCOUNTER — Other Ambulatory Visit (HOSPITAL_COMMUNITY): Payer: Self-pay

## 2023-08-04 MED ORDER — AMOXICILLIN-POT CLAVULANATE 875-125 MG PO TABS
1.0000 | ORAL_TABLET | Freq: Two times a day (BID) | ORAL | 0 refills | Status: DC
  Filled 2023-08-04: qty 14, 7d supply, fill #0

## 2023-08-04 NOTE — Telephone Encounter (Signed)
Sinusitis Rx augmentin

## 2023-08-20 ENCOUNTER — Other Ambulatory Visit (HOSPITAL_COMMUNITY): Payer: Self-pay

## 2023-08-20 ENCOUNTER — Ambulatory Visit: Payer: 59 | Admitting: Family Medicine

## 2023-08-20 VITALS — BP 110/72 | HR 55 | Temp 97.6°F | Ht 65.0 in | Wt 162.0 lb

## 2023-08-20 DIAGNOSIS — J019 Acute sinusitis, unspecified: Secondary | ICD-10-CM | POA: Diagnosis not present

## 2023-08-20 DIAGNOSIS — R058 Other specified cough: Secondary | ICD-10-CM | POA: Diagnosis not present

## 2023-08-20 MED ORDER — AMOXICILLIN-POT CLAVULANATE 875-125 MG PO TABS
1.0000 | ORAL_TABLET | Freq: Two times a day (BID) | ORAL | 0 refills | Status: DC
Start: 2023-08-20 — End: 2024-01-29
  Filled 2023-08-20: qty 20, 10d supply, fill #0

## 2023-08-20 NOTE — Progress Notes (Signed)
Subjective:  Brooke Sherman is a 37 y.o. female who presents for a 2 wk hx of URI Sxs.  She is breastfeeding.   Completed 5 days of Augmentin and her sinus symptoms have not resolved.   New symptoms of ST x 5-7 days and cough 2-3 days  Son in preschool.   2nd child- almost 47 months old   ROS as in subjective.   Objective: Vitals:   08/20/23 0932  BP: 110/72  Pulse: (!) 55  Temp: 97.6 F (36.4 C)  SpO2: 99%    General appearance: Alert, WD/WN, no distress, mildly ill appearing                             Skin: warm, no rash                           Head: no sinus tenderness                            Eyes: conjunctiva normal, corneas clear, PERRLA                            Ears: pearly TMs, external ear canals normal                          Nose: septum midline, turbinates swollen, with erythema and clear discharge             Mouth/throat: MMM, tongue normal, mild pharyngeal erythema                           Neck: supple, no adenopathy, no thyromegaly, nontender                          Heart: RRR                         Lungs: CTA bilaterally, no wheezes, rales, or rhonchi      Assessment: Acute sinusitis with symptoms > 10 days - Plan: amoxicillin-clavulanate (AUGMENTIN) 875-125 MG tablet  Productive cough - Plan: amoxicillin-clavulanate (AUGMENTIN) 875-125 MG tablet   Plan: Augmentin prescribed. Discussed symptom management safe for breastfeeding.  Nasal saline spray for congestion.  Tylenol prn. Salt water gargles.  Follow up prn.

## 2023-08-20 NOTE — Patient Instructions (Signed)
Take the antibiotic as prescribed with food.  Stay hydrated.  Do salt water gargles, throat lozenges, tea with honey and lemon.  Take Tylenol if needed 500 mg or 1000 mg 2 or 3 times daily.  Continue saline nasal spray and try Flonase nasal spray.  Let us know if you are not back to baseline when you complete the antibiotic.

## 2023-08-21 ENCOUNTER — Encounter: Payer: Self-pay | Admitting: Family Medicine

## 2023-08-22 NOTE — Telephone Encounter (Signed)
Please advise on cough, pt was just seen.

## 2023-08-22 NOTE — Telephone Encounter (Signed)
Please advise, pt is pregnant and wondering if there is anything she can take for cough

## 2023-08-27 ENCOUNTER — Ambulatory Visit: Payer: 59 | Admitting: Internal Medicine

## 2023-08-27 ENCOUNTER — Encounter: Payer: Self-pay | Admitting: Internal Medicine

## 2023-08-27 DIAGNOSIS — R7989 Other specified abnormal findings of blood chemistry: Secondary | ICD-10-CM

## 2023-08-27 DIAGNOSIS — E039 Hypothyroidism, unspecified: Secondary | ICD-10-CM

## 2023-08-27 LAB — BASIC METABOLIC PANEL
BUN: 18 mg/dL (ref 6–23)
CO2: 26 meq/L (ref 19–32)
Calcium: 9.7 mg/dL (ref 8.4–10.5)
Chloride: 104 meq/L (ref 96–112)
Creatinine, Ser: 0.79 mg/dL (ref 0.40–1.20)
GFR: 95.85 mL/min (ref >60.00)
Glucose, Bld: 93 mg/dL (ref 70–99)
Potassium: 4.1 meq/L (ref 3.5–5.1)
Sodium: 138 meq/L (ref 135–145)

## 2023-08-27 LAB — TSH: TSH: 1.87 u[IU]/mL (ref 0.35–5.50)

## 2023-08-27 LAB — HEPATIC FUNCTION PANEL
ALT: 29 U/L (ref 0–35)
AST: 25 U/L (ref 0–37)
Albumin: 4.6 g/dL (ref 3.5–5.2)
Alkaline Phosphatase: 65 U/L (ref 39–117)
Bilirubin, Direct: 0.1 mg/dL (ref 0.0–0.3)
Total Bilirubin: 0.4 mg/dL (ref 0.2–1.2)
Total Protein: 7.3 g/dL (ref 6.0–8.3)

## 2023-08-27 LAB — PHOSPHORUS: Phosphorus: 4.7 mg/dL — ABNORMAL HIGH (ref 2.3–4.6)

## 2023-08-27 LAB — VITAMIN D 25 HYDROXY (VIT D DEFICIENCY, FRACTURES): VITD: 28.43 ng/mL — ABNORMAL LOW (ref 30.00–100.00)

## 2023-08-27 NOTE — Patient Instructions (Signed)

## 2023-08-27 NOTE — Progress Notes (Signed)
Subjective:  Patient ID: Brooke Sherman, female    DOB: 03-Jul-1986  Age: 37 y.o. MRN: [id number]  CC: Hypothyroidism   HPI Brooke Sherman presents for f/up ---  Discussed the use of AI scribe software for clinical note transcription with the patient, who gave verbal consent to proceed.  History of Present Illness   The patient presents for follow-up after a recent sinus infection. She reports resolution of the cough and facial and ear pain associated with the infection but continues to experience some postnasal drainage. She is nearing the end of her antibiotic course and denies any systemic symptoms such as fever, chills, or night sweats.  In addition, the patient reports a recent finding of elevated calcium and liver enzymes on routine labs drawn by her rheumatologist. She denies any symptoms typically associated with hypercalcemia such as constipation, muscle or joint aches, or abdominal pain. She does note some aching in the left knee, but attributes this to post-surgical changes following ACL repair.  The patient is currently breastfeeding and continues to take a prenatal vitamin with calcium, despite advice to possibly discontinue it. She denies any significant alcohol intake or use of pain medications beyond a short course of Tylenol and ibuprofen post-delivery. She denies any symptoms suggestive of liver disease such as jaundice, chronic abdominal pain, nausea, vomiting, or loss of appetite.  The patient also has a history of thyroid disease and feels her current thyroid medication dose is adequate. She denies any symptoms of hypothyroidism such as constipation, significant weight changes, or leg swelling. She does note some residual weight from the recent pregnancy but is actively trying to lose it through exercise.       Outpatient Medications Prior to Visit  Medication Sig Dispense Refill   amoxicillin-clavulanate (AUGMENTIN) 875-125 MG tablet Take 1 tablet  by mouth 2 (two) times daily. 20 tablet 0   levothyroxine (SYNTHROID) 50 MCG tablet Take 1 tablet (50 mcg total) by mouth daily. 30 tablet 5   Prenatal Vit-Fe Fumarate-FA (PRENATAL PO) Take by mouth daily.     No facility-administered medications prior to visit.    ROS Review of Systems  Constitutional:  Positive for fatigue. Negative for appetite change, chills, diaphoresis and unexpected weight change.  HENT: Negative.    Eyes:  Negative for visual disturbance.  Respiratory:  Negative for cough, chest tightness, shortness of breath and wheezing.   Cardiovascular:  Negative for chest pain, palpitations and leg swelling.  Gastrointestinal:  Negative for abdominal pain, constipation, diarrhea, nausea and vomiting.  Endocrine: Negative.  Negative for cold intolerance and heat intolerance.  Genitourinary:  Negative for difficulty urinating.  Musculoskeletal: Negative.   Skin: Negative.   Neurological: Negative.   Hematological:  Negative for adenopathy. Does not bruise/bleed easily.  Psychiatric/Behavioral: Negative.      Objective:  BP 112/78   Pulse 65   Temp 97.6 F (36.4 C) (Oral)   Resp 16   Ht 5\' 5"  (1.651 m)   Wt 165 lb 6.4 oz (75 kg)   SpO2 98%   Breastfeeding Yes   BMI 27.52 kg/m   BP Readings from Last 3 Encounters:  08/27/23 112/78  08/20/23 110/72  07/22/23 105/75    Wt Readings from Last 3 Encounters:  08/27/23 165 lb 6.4 oz (75 kg)  08/20/23 162 lb (73.5 kg)  07/22/23 167 lb 3.2 oz (75.8 kg)    Physical Exam Vitals reviewed.  Constitutional:      Appearance: Normal appearance.  HENT:  Nose: Nose normal.     Mouth/Throat:     Mouth: Mucous membranes are moist.  Eyes:     General: No scleral icterus.    Conjunctiva/sclera: Conjunctivae normal.  Cardiovascular:     Rate and Rhythm: Normal rate and regular rhythm.     Heart sounds: No murmur heard.    No friction rub. No gallop.  Pulmonary:     Effort: Pulmonary effort is normal.     Breath  sounds: No stridor. No wheezing, rhonchi or rales.  Abdominal:     General: Abdomen is flat.     Palpations: There is no mass.     Tenderness: There is no abdominal tenderness. There is no guarding.     Hernia: No hernia is present.  Musculoskeletal:        General: Normal range of motion.     Cervical back: Neck supple.     Right lower leg: No edema.     Left lower leg: No edema.  Lymphadenopathy:     Cervical: No cervical adenopathy.  Skin:    General: Skin is warm and dry.  Neurological:     General: No focal deficit present.     Mental Status: She is alert. Mental status is at baseline.  Psychiatric:        Mood and Affect: Mood normal.        Behavior: Behavior normal.     Lab Results  Component Value Date   WBC 4.1 07/22/2023   HGB 13.0 07/22/2023   HCT 40.0 07/22/2023   PLT 284 07/22/2023   GLUCOSE 93 08/27/2023   CHOL 195 11/28/2020   TRIG 100.0 11/28/2020   HDL 60.30 11/28/2020   LDLCALC 115 (H) 11/28/2020   ALT 29 08/27/2023   AST 25 08/27/2023   NA 138 08/27/2023   K 4.1 08/27/2023   CL 104 08/27/2023   CREATININE 0.79 08/27/2023   BUN 18 08/27/2023   CO2 26 08/27/2023   TSH 1.87 08/27/2023    No results found.  Assessment & Plan:   Hypercalcemia- Ca++ is normal. Vit D is slightly low. -     PTH, intact and calcium; Future -     VITAMIN D 25 Hydroxy (Vit-D Deficiency, Fractures); Future -     Phosphorus; Future -     Basic metabolic panel; Future  Elevated LFTs - LFTs have normalized. -     Hepatic function panel; Future  Acquired hypothyroidism - She is euthyroid. -     TSH; Future -     Basic metabolic panel; Future     Follow-up: Return in about 6 months (around 02/24/2024).  Brooke Linger, MD

## 2023-08-29 LAB — PTH, INTACT AND CALCIUM
Calcium: 9.8 mg/dL (ref 8.6–10.2)
PTH: 18 pg/mL (ref 16–77)

## 2023-09-25 ENCOUNTER — Encounter: Payer: Self-pay | Admitting: Physical Therapy

## 2023-10-09 ENCOUNTER — Encounter: Payer: Self-pay | Admitting: Obstetrics and Gynecology

## 2023-11-10 ENCOUNTER — Other Ambulatory Visit (HOSPITAL_COMMUNITY): Payer: Self-pay

## 2023-11-12 NOTE — Progress Notes (Signed)
 Office Visit Note  Patient: Brooke Sherman             Date of Birth: 1985/10/22           MRN: [id number]             PCP: Etta Grandchild, MD Referring: Etta Grandchild, MD Visit Date: 11/26/2023 Occupation: @GUAROCC @  Subjective:  Increased pain and hands  History of Present Illness: Brooke Sherman is a 38 y.o. female with Raynaud's and positive ANA.  She returns for the follow-up visit.  She is 6 months postpartum now.  She states recently she been having increased pain and discomfort in her hands which she describes over the PIP joints.  She states the symptoms got worse about 3 weeks ago with increased pain and discomfort in her bilateral third and fourth PIP joints.  She states gradually the pain gradually improved.  She did not notice any visible swelling.  She continues to have some carpal tunnel symptoms in her right hand.  She works out on a regular basis.  She also notices discomfort in her elbows and her knee joints.  She states she has been carrying her baby.    Activities of Daily Living:  Patient reports morning stiffness for 0 minutes.   Patient Reports nocturnal pain.  Difficulty dressing/grooming: Denies Difficulty climbing stairs: Denies Difficulty getting out of chair: Denies Difficulty using hands for taps, buttons, cutlery, and/or writing: Reports  Review of Systems  Constitutional:  Negative for fatigue.  HENT:  Negative for mouth sores, mouth dryness and nose dryness.   Eyes:  Positive for dryness. Negative for pain.  Respiratory:  Negative for shortness of breath and difficulty breathing.   Cardiovascular:  Negative for chest pain and palpitations.  Gastrointestinal:  Negative for blood in stool, constipation and diarrhea.  Endocrine: Negative for increased urination.  Genitourinary:  Negative for involuntary urination.  Musculoskeletal:  Positive for joint pain, joint pain and joint swelling. Negative for gait problem, myalgias,  muscle weakness, morning stiffness, muscle tenderness and myalgias.  Skin:  Positive for color change and hair loss. Negative for rash and sensitivity to sunlight.  Allergic/Immunologic: Negative for susceptible to infections.  Neurological:  Positive for numbness. Negative for dizziness and headaches.  Hematological:  Negative for swollen glands.  Psychiatric/Behavioral:  Negative for depressed mood and sleep disturbance. The patient is not nervous/anxious.     PMFS History:  Patient Active Problem List   Diagnosis Date Noted   Elevated LFTs 08/27/2023   Hypercalcemia 08/27/2023   Acquired hypothyroidism 08/27/2023    Past Medical History:  Diagnosis Date   Carpal tunnel syndrome    Hypothyroid    Raynaud's disease     Family History  Problem Relation Age of Onset   Cervical cancer Mother    Healthy Son    Healthy Daughter    Past Surgical History:  Procedure Laterality Date   ANTERIOR CRUCIATE LIGAMENT REPAIR Left 2019   WISDOM TOOTH EXTRACTION     Social History   Social History Narrative   Not on file   Immunization History  Administered Date(s) Administered   Influenza, Quadrivalent, Recombinant, Inj, Pf 07/02/2022   Influenza,inj,Quad PF,6+ Mos 06/12/2020   Influenza-Unspecified 07/11/2021, 06/08/2023   PFIZER Comirnaty(Gray Top)Covid-19 Tri-Sucrose Vaccine 07/01/2022   Pfizer Covid-19 Vaccine Bivalent Booster 82yrs & up 07/11/2021   Tdap 06/12/2020, 02/28/2023   Unspecified SARS-COV-2 Vaccination 12/12/2019, 01/09/2020, 09/06/2020     Objective: Vital Signs: BP 103/67 (BP Location:  Left Arm, Patient Position: Sitting, Cuff Size: Normal)   Pulse 73   Resp 14   Ht 5\' 5"  (1.651 m)   Wt 162 lb 6.4 oz (73.7 kg)   Breastfeeding Yes   BMI 27.02 kg/m    Physical Exam Vitals and nursing note reviewed.  Constitutional:      Appearance: She is well-developed.  HENT:     Head: Normocephalic and atraumatic.  Eyes:     Conjunctiva/sclera: Conjunctivae normal.   Cardiovascular:     Rate and Rhythm: Normal rate and regular rhythm.     Heart sounds: Normal heart sounds.  Pulmonary:     Effort: Pulmonary effort is normal.     Breath sounds: Normal breath sounds.  Abdominal:     General: Bowel sounds are normal.     Palpations: Abdomen is soft.  Musculoskeletal:     Cervical back: Normal range of motion.  Lymphadenopathy:     Cervical: No cervical adenopathy.  Skin:    General: Skin is warm and dry.     Capillary Refill: Capillary refill takes less than 2 seconds.  Neurological:     Mental Status: She is alert and oriented to person, place, and time.  Psychiatric:        Behavior: Behavior normal.      Musculoskeletal Exam: Cervical, thoracic and lumbar spine 1 good range of motion.  Shoulders, elbows, wrist joints, MCPs PIPs and DIPs with good range of motion with no synovitis.  She has some discomfort over bilateral PIP joints.  Hip joints and knee joints with good range of motion without any warmth swelling or effusion.  She has scarring on her left knee.  There was no tenderness over ankles or MTPs.  CDAI Exam: CDAI Score: -- Patient Global: --; Provider Global: -- Swollen: --; Tender: -- Joint Exam 11/26/2023   No joint exam has been documented for this visit   There is currently no information documented on the homunculus. Go to the Rheumatology activity and complete the homunculus joint exam.  Investigation: No additional findings.  Imaging: [place] LIMITED JOINT SPACE STRUCTURES UP BILAT Result Date: 11/26/2023 Ultrasound examination of bilateral hands was performed per EULAR recommendations. Using 15 MHz transducer, grayscale and power Doppler bilateral second and third MCP joints and PIP joints both dorsal and volar aspects were evaluated to look for synovitis or tenosynovitis. The findings were there was no synovitis or tenosynovitis on ultrasound examination. Right median nerve was 0.06 cm squares which was within normal limits  and left median nerve was Ro 0.06 cm squares which was within normal limits. Impression: No synovitis or tenosynovitis was noted on the ultrasound examination.  Bilateral median nerves are within normal limits.   Recent Labs: Lab Results  Component Value Date   WBC 4.1 07/22/2023   HGB 13.0 07/22/2023   PLT 284 07/22/2023   NA 138 08/27/2023   K 4.1 08/27/2023   CL 104 08/27/2023   CO2 26 08/27/2023   GLUCOSE 93 08/27/2023   BUN 18 08/27/2023   CREATININE 0.79 08/27/2023   BILITOT 0.4 08/27/2023   ALKPHOS 65 08/27/2023   AST 25 08/27/2023   ALT 29 08/27/2023   PROT 7.3 08/27/2023   ALBUMIN 4.6 08/27/2023   CALCIUM 9.7 08/27/2023   CALCIUM 9.8 08/27/2023    Speciality Comments: No specialty comments available.  Procedures:  No procedures performed Allergies: Patient has no known allergies.   Assessment / Plan:     Visit Diagnoses: Pain in both hands -  Brooke Sherman is 6 months postpartum now.  She states she has been working out does [employer]  She has been having increased pain and discomfort in the bilateral hands.  She has not noticed any visible swelling.  She describes discomfort over the PIP joints.  Her serology is negative in the past except for low titer positive ANA.  Plan: Sedimentation rate, ANA, Cyclic citrul peptide antibody, IgG, Rheumatoid factor, Anti-DNA antibody, double-stranded, [place] LIMITED JOINT SPACE STRUCTURES UP BILAT.  A limited ultrasound examination of bilateral hands was performed.  No synovitis was noted in the bilateral second and third MCP and PIP joints.  Bilateral median nerves are within normal limits.  I advised her to contact me if she develops any increased swelling.  I will also obtain labs today which will include sed rate, ANA, dsDNA, anti-CCP and rheumatoid factor.  Will contact her with the lab results.  Pain in right wrist - She had MRI by Dr. Melvyn Novas of her wrist joint which showed mild degenerative changes and interstitial fraying of the  ulnar attachment of the TFCC.  Primary osteoarthritis of both hands-she had bilateral PIP and DIP thickening.  Joint protection muscle strengthening was discussed.  Bilateral carpal tunnel syndrome - She states with the previous pregnancy after she stopped nursing the symptoms resolved by itself.  Bilateral median nerves are within normal limits today.  Pain in both elbows-there was no tenderness or synovitis on the examination today.  Chronic pain of left knee-she continues to have discomfort in her knee joints.  No warmth swelling or effusion was noted today.  Raynaud's syndrome without gangrene-she has mild symptoms and currently not very active.  Positive ANA (antinuclear antibody) - Feb 20, 2022 WBC 3.7, ANA 1: 40NS, ENA negative, C3 94, C4 11.  Protrusion of thoracic intervertebral disc - diagnosed in [place].  Patient has intermittent thoracic radiculopathy per patient.  History of hypothyroidism  Orders: Orders Placed This Encounter  Procedures   [place] LIMITED JOINT SPACE STRUCTURES UP BILAT   Sedimentation rate   ANA   Cyclic citrul peptide antibody, IgG   Rheumatoid factor   Anti-DNA antibody, double-stranded   No orders of the defined types were placed in this encounter.    Follow-Up Instructions: Return in about 1 year (around 11/25/2024) for Polyarthralgia.   Pollyann Savoy, MD  Note - This record has been created using Animal nutritionist.  Chart creation errors have been sought, but may not always  have been located. Such creation errors do not reflect on  the standard of medical care.

## 2023-11-26 ENCOUNTER — Ambulatory Visit: Payer: 59

## 2023-11-26 ENCOUNTER — Ambulatory Visit: Payer: 59 | Attending: Rheumatology | Admitting: Rheumatology

## 2023-11-26 ENCOUNTER — Encounter: Payer: Self-pay | Admitting: Rheumatology

## 2023-11-26 VITALS — BP 103/67 | HR 73 | Resp 14 | Ht 65.0 in | Wt 162.4 lb

## 2023-11-26 DIAGNOSIS — M25531 Pain in right wrist: Secondary | ICD-10-CM | POA: Diagnosis not present

## 2023-11-26 DIAGNOSIS — I7300 Raynaud's syndrome without gangrene: Secondary | ICD-10-CM | POA: Diagnosis not present

## 2023-11-26 DIAGNOSIS — M19042 Primary osteoarthritis, left hand: Secondary | ICD-10-CM

## 2023-11-26 DIAGNOSIS — G5603 Carpal tunnel syndrome, bilateral upper limbs: Secondary | ICD-10-CM | POA: Diagnosis not present

## 2023-11-26 DIAGNOSIS — M79642 Pain in left hand: Secondary | ICD-10-CM | POA: Diagnosis not present

## 2023-11-26 DIAGNOSIS — R768 Other specified abnormal immunological findings in serum: Secondary | ICD-10-CM

## 2023-11-26 DIAGNOSIS — M79641 Pain in right hand: Secondary | ICD-10-CM | POA: Diagnosis not present

## 2023-11-26 DIAGNOSIS — M19041 Primary osteoarthritis, right hand: Secondary | ICD-10-CM

## 2023-11-26 DIAGNOSIS — M25562 Pain in left knee: Secondary | ICD-10-CM

## 2023-11-26 DIAGNOSIS — G8929 Other chronic pain: Secondary | ICD-10-CM

## 2023-11-26 DIAGNOSIS — M25522 Pain in left elbow: Secondary | ICD-10-CM

## 2023-11-26 DIAGNOSIS — M5124 Other intervertebral disc displacement, thoracic region: Secondary | ICD-10-CM | POA: Diagnosis not present

## 2023-11-26 DIAGNOSIS — M25521 Pain in right elbow: Secondary | ICD-10-CM

## 2023-11-26 DIAGNOSIS — Z8639 Personal history of other endocrine, nutritional and metabolic disease: Secondary | ICD-10-CM

## 2023-11-29 LAB — ANA: Anti Nuclear Antibody (ANA): POSITIVE — AB

## 2023-11-29 LAB — ANTI-DNA ANTIBODY, DOUBLE-STRANDED: ds DNA Ab: 1 [IU]/mL

## 2023-11-29 LAB — CYCLIC CITRUL PEPTIDE ANTIBODY, IGG: Cyclic Citrullin Peptide Ab: <16 U

## 2023-11-29 LAB — ANTI-NUCLEAR AB-TITER (ANA TITER): ANA Titer 1: 1:40 {titer} — ABNORMAL HIGH

## 2023-11-29 LAB — RHEUMATOID FACTOR: Rheumatoid fact SerPl-aCnc: 13 [IU]/mL (ref <14)

## 2023-11-29 LAB — SEDIMENTATION RATE: Sed Rate: 6 mm/h (ref 0–20)

## 2023-11-30 NOTE — Progress Notes (Signed)
 ANA is low titer positive and not significant.  Double-stranded DNA negative sed rate (which indicates inflammation) normal, tests for rheumatoid arthritis (rheumatoid factor negative, anti-CCP negative).

## 2023-12-01 ENCOUNTER — Encounter: Payer: Self-pay | Admitting: Internal Medicine

## 2023-12-02 ENCOUNTER — Telehealth: Payer: Self-pay | Admitting: Student in an Organized Health Care Education/Training Program

## 2023-12-02 ENCOUNTER — Other Ambulatory Visit (HOSPITAL_COMMUNITY): Payer: Self-pay

## 2023-12-02 DIAGNOSIS — J019 Acute sinusitis, unspecified: Secondary | ICD-10-CM

## 2023-12-02 MED ORDER — AMOXICILLIN-POT CLAVULANATE 875-125 MG PO TABS
1.0000 | ORAL_TABLET | Freq: Two times a day (BID) | ORAL | 0 refills | Status: AC
Start: 2023-12-02 — End: 2023-12-13
  Filled 2023-12-02: qty 20, 10d supply, fill #0

## 2023-12-02 NOTE — Telephone Encounter (Signed)
 Augmentin script sent for acute bacterial sinusitis

## 2023-12-09 ENCOUNTER — Other Ambulatory Visit: Payer: Self-pay | Admitting: Obstetrics and Gynecology

## 2023-12-09 DIAGNOSIS — E039 Hypothyroidism, unspecified: Secondary | ICD-10-CM

## 2023-12-10 ENCOUNTER — Other Ambulatory Visit (HOSPITAL_COMMUNITY): Payer: Self-pay

## 2023-12-10 MED ORDER — LEVOTHYROXINE SODIUM 50 MCG PO TABS
50.0000 ug | ORAL_TABLET | Freq: Every day | ORAL | 5 refills | Status: DC
Start: 2023-12-10 — End: 2024-03-03
  Filled 2023-12-10: qty 30, 30d supply, fill #0
  Filled 2024-01-07: qty 30, 30d supply, fill #1
  Filled 2024-02-09: qty 30, 30d supply, fill #2

## 2023-12-11 ENCOUNTER — Other Ambulatory Visit: Payer: Self-pay

## 2023-12-11 ENCOUNTER — Ambulatory Visit: Attending: Obstetrics and Gynecology | Admitting: Physical Therapy

## 2023-12-11 ENCOUNTER — Encounter: Payer: Self-pay | Admitting: Physical Therapy

## 2023-12-11 DIAGNOSIS — R262 Difficulty in walking, not elsewhere classified: Secondary | ICD-10-CM | POA: Diagnosis not present

## 2023-12-11 DIAGNOSIS — R293 Abnormal posture: Secondary | ICD-10-CM | POA: Insufficient documentation

## 2023-12-11 DIAGNOSIS — R279 Unspecified lack of coordination: Secondary | ICD-10-CM | POA: Insufficient documentation

## 2023-12-11 DIAGNOSIS — R252 Cramp and spasm: Secondary | ICD-10-CM | POA: Diagnosis not present

## 2023-12-11 DIAGNOSIS — M6281 Muscle weakness (generalized): Secondary | ICD-10-CM | POA: Diagnosis not present

## 2023-12-11 NOTE — Therapy (Signed)
 OUTPATIENT PHYSICAL THERAPY FEMALE PELVIC EVALUATION   Patient Name: Brooke Sherman MRN: [id number] DOB:[birth year], 38 y.o., female Today's Date: 12/11/2023  END OF SESSION:  PT End of Session - 12/11/23 1558     Visit Number 1    Date for PT Re-Evaluation 06/12/24    Authorization Type cone    PT Start Time 1445    PT Stop Time 1531    PT Time Calculation (min) 46 min    Activity Tolerance Patient tolerated treatment well    Behavior During Therapy [facility] for tasks assessed/performed             Past Medical History:  Diagnosis Date   Carpal tunnel syndrome    Hypothyroid    Raynaud's disease    Past Surgical History:  Procedure Laterality Date   ANTERIOR CRUCIATE LIGAMENT REPAIR Left 2019   WISDOM TOOTH EXTRACTION     Patient Active Problem List   Diagnosis Date Noted   Elevated LFTs 08/27/2023   Hypercalcemia 08/27/2023   Acquired hypothyroidism 08/27/2023    PCP: Etta Grandchild, MD   REFERRING PROVIDER: Lennart Pall, MD  REFERRING DIAG: O70.9 (ICD-10-CM) - Perineal laceration during delivery, delivered Z39.2 (ICD-10-CM) - Postpartum exam  THERAPY DIAG:  Muscle weakness (generalized)  Abnormal posture  Unspecified lack of coordination  Rationale for Evaluation and Treatment: Rehabilitation  ONSET DATE: 6 months postpartum  SUBJECTIVE:                                                                                                                                                                                           SUBJECTIVE STATEMENT: Hasn't really returned to running yet, when attempted only trying 20 mins and next day low back is painful.  Also concerned with possible DRA separation  Has been doing beginner core exercises and trying to introduce   Fluid intake: 80-100oz (breast feeding)  PAIN:  Are you having pain? Yes NPRS scale: 5/10 Pain location:  low back   Pain type: aching Pain description: dull    Aggravating factors: running Relieving factors: rest/ stretching  PRECAUTIONS: Other: postpartum   RED FLAGS: None   WEIGHT BEARING RESTRICTIONS: No  FALLS:  Has patient fallen in last 6 months? No    ACTIVITY LEVEL : moderate   PLOF: Independent  PATIENT GOALS: to be stronger and be able to have strength for a 3rd baby   PERTINENT HISTORY:  Asthma, Carpal tunnel syndrome, Hypothyroid, and Raynaud's disease, ACL repair 2019, x2 vaginal births with 2nd deg tearing  Sexual abuse: No  BOWEL MOVEMENT: Pain with bowel movement: No Denies concerns   URINATION:  Pain with urination: No Fully empty bladder: Yes:   Stream: Strong Urgency: No Frequency: right at 2 hours Leakage:  possibly with a very strong cough/sneeze, doesn't think so with running Pads: No  INTERCOURSE:  Ability to have vaginal penetration Yes  Pain with intercourse: Initial Penetration DrynessYes  Climax: not painful Marinoff Scale: 0/3 Laxative:  PREGNANCY: Vaginal deliveries 2 Tearing Yes: grade 2 with first and second babies (sulcal and Rt labia second baby) Episiotomy No C-section deliveries 0 Currently pregnant No  PROLAPSE: Pressure Vaginally   OBJECTIVE:  Note: Objective measures were completed at Evaluation unless otherwise noted.  DIAGNOSTIC FINDINGS:    COGNITION: Overall cognitive status: Within functional limits for tasks assessed     SENSATION: Light touch: Appears intact  LUMBAR SPECIAL TESTS:  SI Compression/distraction test: Negative and Thomas test: Negative Hip drop bil with single leg stance  FUNCTIONAL TESTS:  Functional squat - good mechanics but decreased descent by 25% and slight breath holding  GAIT: WFL  POSTURE: rounded shoulders, forward head, and anterior pelvic tilt   LUMBARAROM/PROM:  A/PROM A/PROM  eval  Flexion WFL  Extension WFL  Right lateral flexion WFL  Left lateral flexion WFL  Right rotation Limited by 25%  Left rotation  Limited by 25%   (Blank rows = not tested)  LOWER EXTREMITY ROM:  Bil  hip flexors and hamstrings and adductors and IR/ER limited by 25%  LOWER EXTREMITY MMT:  Bil hips grossly 4+/5; knees 5/5 PALPATION:   General: tightness noted in bil thoracic and lumbar paraspinals   Pelvic Alignment: WFL  Abdominal: [facility]                External Perineal Exam: mild TTP at Rt labia (a previous tear site)                             Internal Pelvic Floor: mild TTP with tightness at Rt superficial pelvic floor   Patient confirms identification and approves PT to assess internal pelvic floor and treatment Yes No emotional/communication barriers or cognitive limitation. Patient is motivated to learn. Patient understands and agrees with treatment goals and plan. PT explains patient will be examined in standing, sitting, and lying down to see how their muscles and joints work. When they are ready, they will be asked to remove their underwear so PT can examine their perineum. The patient is also given the option of providing their own chaperone as one is not provided in our facility. The patient also has the right and is explained the right to defer or refuse any part of the evaluation or treatment including the internal exam. With the patient's consent, PT will use one gloved finger to gently assess the muscles of the pelvic floor, seeing how well it contracts and relaxes and if there is muscle symmetry. After, the patient will get dressed and PT and patient will discuss exam findings and plan of care. PT and patient discuss plan of care, schedule, attendance policy and HEP activities.  PELVIC MMT:   MMT eval  Vaginal 2/5>3/5 with reps 7s, 6 reps  Internal Anal Sphincter   External Anal Sphincter   Puborectalis   Diastasis Recti 1.5 finger width for 2 inches above umbilicus, 1 finger width below for 1 in. Poor density noted   (Blank rows = not tested)        TONE: WFL  PROLAPSE: Possible grade 1  anterior vaginal wall laxity with cough  in hooklying   TODAY'S TREATMENT:                                                                                                                              DATE:   12/11/23 EVAL Examination completed, findings reviewed, pt educated on POC, HEP, and importance of proper core activation x10 transverse abdominis activations completed to insure carry over for home and pt demonstrated improved DRA measurements with activating transverse abdominis for sit up. Pt also educated on knack to decreased stress at pelvic floor with coughing/sneezing and to decreased urinary incontinence with this. Pt motivated to participate in PT and agreeable to attempt recommendations.     PATIENT EDUCATION:  Education details: [id number] Person educated: Patient Education method: Programmer, multimedia, Demonstration, Tactile cues, Verbal cues, and Handouts Education comprehension: verbalized understanding, returned demonstration, verbal cues required, tactile cues required, and needs further education  HOME EXERCISE PROGRAM: [id number]  ASSESSMENT:  CLINICAL IMPRESSION: Patient is a 38 y.o. female  who was seen today for physical therapy evaluation and treatment for postpartum weakness status post second degree lacerations  at sulcus and Rt labia with second vaginal delivery. Pt found to have decreased core strength and noted DRA separation with bulging with active sit up and 1.5 knuckle depth. Pt also demonstrated bil hip tightness and mild weakness and tightness in paraspinals. Does endorse low back pain after running for ~20 mins. Pt reports she has been trying to workout more but does note doming at abdomen with core work and has had a little urinary incontinence with stressors. Patient consented to internal pelvic floor assessment vaginally this date and found to have decreased strength, endurance, and coordination, tension at superficial pelvic floor and TTP at Rt side of superficial  pelvic floor. Pt has history of tearing with past two vaginal deliveries and limitations in mobility noted along these areas. Pt did demonstrate anterior wall laxity with coughing but with cues for pelvic floor contraction then cough not noted. Pt would benefit from additional PT to further address deficits.    OBJECTIVE IMPAIRMENTS: decreased activity tolerance, decreased coordination, decreased endurance, decreased mobility, decreased strength, increased fascial restrictions, increased muscle spasms, impaired flexibility, improper body mechanics, postural dysfunction, and pain.   ACTIVITY LIMITATIONS: lifting, standing, squatting, continence, and locomotion level  PARTICIPATION LIMITATIONS: interpersonal relationship, community activity, and yard work  PERSONAL FACTORS: Fitness, Time since onset of injury/illness/exacerbation, and 1 comorbidity: medical history   are also affecting patient's functional outcome.   REHAB POTENTIAL: Good  CLINICAL DECISION MAKING: Stable/uncomplicated  EVALUATION COMPLEXITY: Low   GOALS: Goals reviewed with patient? Yes  SHORT TERM GOALS: Target date: 01/08/24  Pt to be I with HEP.  Baseline: Goal status: INITIAL  2.  Pt to be I with knack and pressure management with lifting mechanics to decreased strain at pelvic floor.  Baseline:  Goal status: INITIAL  3.  Pt to demonstrate improved coordination of pelvic floor and breathing mechanics with 10#  squat with appropriate synergistic patterns to decrease pain and leakage at least 50% of the time.    Baseline:  Goal status: INITIAL   LONG TERM GOALS: Target date: 06/12/25  Pt to be I with advanced HEP.  Baseline:  Goal status: INITIAL  2.  Pt to demonstrate improved coordination of pelvic floor and breathing mechanics with 50# squat with appropriate synergistic patterns to decrease pain and leakage at least 75% of the time.    Baseline:  Goal status: INITIAL  3.  Pt will be able to run/workout  for 30 minutes without leakage or discomfort  Baseline:  Goal status: INITIAL  4.  Pt to demonstrate no compensatory strategies for x10 single leg squats 10# each leg for improved tolerance to return to running with decreased strain at pelvic floor.  Baseline:  Goal status: INITIAL  5.  Pt to report no more than 2/10  pain with intercourse due to improved mobility and lubrication.  Baseline:  Goal status: INITIAL  6.  Pt to demonstrate improved core activation consistently with exercises to improved DRA separation to maintain moderate tissue density.  Baseline:  Goal status: INITIAL  PLAN:  PT FREQUENCY: 1x/week  PT DURATION:  12 sessions  PLANNED INTERVENTIONS: 97110-Therapeutic exercises, 97530- Therapeutic activity, 97112- Neuromuscular re-education, 97535- Self Care, 10272- Manual therapy, Patient/Family education, Taping, Dry Needling, Joint mobilization, Spinal mobilization, Scar mobilization, DME instructions, Cryotherapy, Moist heat, and Biofeedback  PLAN FOR NEXT SESSION: transverse abdominis activation, glute and core strength, DRA based exercises (opp glute and lat motions), exhale with all push/pull/lifts   Otelia Sergeant, PT, DPT 03/13/254:02 PM

## 2023-12-15 ENCOUNTER — Encounter: Payer: Self-pay | Admitting: Physical Therapy

## 2023-12-15 ENCOUNTER — Ambulatory Visit: Admitting: Physical Therapy

## 2023-12-15 DIAGNOSIS — R262 Difficulty in walking, not elsewhere classified: Secondary | ICD-10-CM | POA: Diagnosis not present

## 2023-12-15 DIAGNOSIS — M6281 Muscle weakness (generalized): Secondary | ICD-10-CM | POA: Diagnosis not present

## 2023-12-15 DIAGNOSIS — R279 Unspecified lack of coordination: Secondary | ICD-10-CM

## 2023-12-15 DIAGNOSIS — R293 Abnormal posture: Secondary | ICD-10-CM | POA: Diagnosis not present

## 2023-12-15 DIAGNOSIS — R252 Cramp and spasm: Secondary | ICD-10-CM | POA: Diagnosis not present

## 2023-12-15 NOTE — Patient Instructions (Signed)

## 2023-12-15 NOTE — Therapy (Signed)
 OUTPATIENT PHYSICAL THERAPY FEMALE PELVIC EVALUATION   Patient Name: Brooke Sherman MRN: [id number] DOB:May 16, 1986, 38 y.o., female Today's Date: 12/15/2023  END OF SESSION:  PT End of Session - 12/15/23 1617     Visit Number 2    Date for PT Re-Evaluation 06/12/24    Authorization Type cone    PT Start Time 1532    PT Stop Time 1617    PT Time Calculation (min) 45 min    Activity Tolerance Patient tolerated treatment well    Behavior During Therapy [facility] for tasks assessed/performed              Past Medical History:  Diagnosis Date   Carpal tunnel syndrome    Hypothyroid    Raynaud's disease    Past Surgical History:  Procedure Laterality Date   ANTERIOR CRUCIATE LIGAMENT REPAIR Left 2019   WISDOM TOOTH EXTRACTION     Patient Active Problem List   Diagnosis Date Noted   Elevated LFTs 08/27/2023   Hypercalcemia 08/27/2023   Acquired hypothyroidism 08/27/2023    PCP: Etta Grandchild, MD   REFERRING PROVIDER: Lennart Pall, MD  REFERRING DIAG: O70.9 (ICD-10-CM) - Perineal laceration during delivery, delivered Z39.2 (ICD-10-CM) - Postpartum exam  THERAPY DIAG:  Muscle weakness (generalized)  Abnormal posture  Unspecified lack of coordination  Rationale for Evaluation and Treatment: Rehabilitation  ONSET DATE: 6 months postpartum  SUBJECTIVE:                                                                                                                                                                                           SUBJECTIVE STATEMENT:   Fluid intake: 80-100oz (breast feeding)  PAIN:  Are you having pain? Yes NPRS scale: 5/10 Pain location:  low back   Pain type: aching Pain description: dull   Aggravating factors: running Relieving factors: rest/ stretching  PRECAUTIONS: Other: postpartum   RED FLAGS: None   WEIGHT BEARING RESTRICTIONS: No  FALLS:  Has patient fallen in last 6 months? No    ACTIVITY  LEVEL : moderate   PLOF: Independent  PATIENT GOALS: to be stronger and be able to have strength for a 3rd baby   PERTINENT HISTORY:  Asthma, Carpal tunnel syndrome, Hypothyroid, and Raynaud's disease, ACL repair 2019, x2 vaginal births with 2nd deg tearing  Sexual abuse: No  BOWEL MOVEMENT: Pain with bowel movement: No Denies concerns   URINATION: Pain with urination: No Fully empty bladder: Yes:   Stream: Strong Urgency: No Frequency: right at 2 hours Leakage:  possibly with a very strong cough/sneeze, doesn't think so with running Pads: No  INTERCOURSE:  Ability to have vaginal penetration Yes  Pain with intercourse: Initial Penetration DrynessYes  Climax: not painful Marinoff Scale: 0/3 Laxative:  PREGNANCY: Vaginal deliveries 2 Tearing Yes: grade 2 with first and second babies (sulcal and Rt labia second baby) Episiotomy No C-section deliveries 0 Currently pregnant No  PROLAPSE: Pressure Vaginally   OBJECTIVE:  Note: Objective measures were completed at Evaluation unless otherwise noted.  DIAGNOSTIC FINDINGS:    COGNITION: Overall cognitive status: Within functional limits for tasks assessed     SENSATION: Light touch: Appears intact  LUMBAR SPECIAL TESTS:  SI Compression/distraction test: Negative and Thomas test: Negative Hip drop bil with single leg stance  FUNCTIONAL TESTS:  Functional squat - good mechanics but decreased descent by 25% and slight breath holding  GAIT: WFL  POSTURE: rounded shoulders, forward head, and anterior pelvic tilt   LUMBARAROM/PROM:  A/PROM A/PROM  eval  Flexion WFL  Extension WFL  Right lateral flexion WFL  Left lateral flexion WFL  Right rotation Limited by 25%  Left rotation Limited by 25%   (Blank rows = not tested)  LOWER EXTREMITY ROM:  Bil  hip flexors and hamstrings and adductors and IR/ER limited by 25%  LOWER EXTREMITY MMT:  Bil hips grossly 4+/5; knees 5/5 PALPATION:   General:  tightness noted in bil thoracic and lumbar paraspinals   Pelvic Alignment: WFL  Abdominal: [facility]                External Perineal Exam: mild TTP at Rt labia (a previous tear site)                             Internal Pelvic Floor: mild TTP with tightness at Rt superficial pelvic floor   Patient confirms identification and approves PT to assess internal pelvic floor and treatment Yes No emotional/communication barriers or cognitive limitation. Patient is motivated to learn. Patient understands and agrees with treatment goals and plan. PT explains patient will be examined in standing, sitting, and lying down to see how their muscles and joints work. When they are ready, they will be asked to remove their underwear so PT can examine their perineum. The patient is also given the option of providing their own chaperone as one is not provided in our facility. The patient also has the right and is explained the right to defer or refuse any part of the evaluation or treatment including the internal exam. With the patient's consent, PT will use one gloved finger to gently assess the muscles of the pelvic floor, seeing how well it contracts and relaxes and if there is muscle symmetry. After, the patient will get dressed and PT and patient will discuss exam findings and plan of care. PT and patient discuss plan of care, schedule, attendance policy and HEP activities.  PELVIC MMT:   MMT eval  Vaginal 2/5>3/5 with reps 7s, 6 reps  Internal Anal Sphincter   External Anal Sphincter   Puborectalis   Diastasis Recti 1.5 finger width for 2 inches above umbilicus, 1 finger width below for 1 in. Poor density noted   (Blank rows = not tested)        TONE: WFL  PROLAPSE: Possible grade 1 anterior vaginal wall laxity with cough in hooklying   TODAY'S TREATMENT:  DATE:   12/15/23 Quadriped  exercises completed on purple yoga mat on floor due to wrist pain when on mat table.  Quadriped opp arm/leg x 20 B cues  (verbal and tactile) for level pelvis Quad thoracic rotation x 10 Quad mule kicks x 10 B Prone opp/leg x 10 ea - some LBP 90/90 unilateral isometric press with opp hand to knee 5 sec hold x 10 B 90/90 heel taps x 10 B some LBP reported  Self Care: DN education and aftercare  Manual therapy: Trigger Point Dry Needling  Initial Treatment: Pt instructed on Dry Needling rational, procedures, and possible side effects. Pt instructed to expect mild to moderate muscle soreness later in the day and/or into the next day.  Pt instructed in methods to reduce muscle soreness. Pt instructed to continue prescribed HEP. Patient was educated on signs and symptoms of infection and other risk factors and advised to seek medical attention should they occur.  Patient verbalized understanding of these instructions and education.   Patient Verbal Consent Given: Yes Education Handout Provided: Yes Muscles Treated: B lumbar multifidi L3-L5 Electrical Stimulation Performed: No Treatment Response/Outcome: Utilized skilled palpation to identify bony landmarks and trigger points.  Able to illicit twitch response and muscle elongation.  Soft tissue mobilization to B lumbar following DN to further promote tissue elongation and decreased pain.      12/11/23 EVAL Examination completed, findings reviewed, pt educated on POC, HEP, and importance of proper core activation x10 transverse abdominis activations completed to insure carry over for home and pt demonstrated improved DRA measurements with activating transverse abdominis for sit up. Pt also educated on knack to decreased stress at pelvic floor with coughing/sneezing and to decreased urinary incontinence with this. Pt motivated to participate in PT and agreeable to attempt recommendations.     PATIENT EDUCATION:  Education details:  [id number] Person educated: Patient Education method: Explanation, Demonstration, Tactile cues, Verbal cues, and Handouts Education comprehension: verbalized understanding, returned demonstration, verbal cues required, tactile cues required, and needs further education  HOME EXERCISE PROGRAM: [id number]  ASSESSMENT:  CLINICAL IMPRESSION: Patient presents for first f/u visit. We reviewed some of her HEP, but pt reported some back pain with some exercises. She demonstrates weak stabilizers in the low back with quadriped exercises and reported LBP with certain exercises today as well.  PT advised she complete prone hip extension prior to quadriped exercises to activate muscles with her hips fixed. Initial trial of DN to B lumbar completed after education and consent of pt. Excellent response.   Patient is a 38 y.o. female  who was seen today for physical therapy evaluation and treatment for postpartum weakness status post second degree lacerations  at sulcus and Rt labia with second vaginal delivery. Pt found to have decreased core strength and noted DRA separation with bulging with active sit up and 1.5 knuckle depth. Pt also demonstrated bil hip tightness and mild weakness and tightness in paraspinals. Does endorse low back pain after running for ~20 mins. Pt reports she has been trying to workout more but does note doming at abdomen with core work and has had a little urinary incontinence with stressors. Patient consented to internal pelvic floor assessment vaginally this date and found to have decreased strength, endurance, and coordination, tension at superficial pelvic floor and TTP at Rt side of superficial pelvic floor. Pt has history of tearing with past two vaginal deliveries and limitations in mobility noted along these areas. Pt did demonstrate anterior wall laxity with  coughing but with cues for pelvic floor contraction then cough not noted. Pt would benefit from additional PT to further address  deficits.    OBJECTIVE IMPAIRMENTS: decreased activity tolerance, decreased coordination, decreased endurance, decreased mobility, decreased strength, increased fascial restrictions, increased muscle spasms, impaired flexibility, improper body mechanics, postural dysfunction, and pain.   ACTIVITY LIMITATIONS: lifting, standing, squatting, continence, and locomotion level  PARTICIPATION LIMITATIONS: interpersonal relationship, community activity, and yard work  PERSONAL FACTORS: Fitness, Time since onset of injury/illness/exacerbation, and 1 comorbidity: medical history   are also affecting patient's functional outcome.   REHAB POTENTIAL: Good  CLINICAL DECISION MAKING: Stable/uncomplicated  EVALUATION COMPLEXITY: Low   GOALS: Goals reviewed with patient? Yes  SHORT TERM GOALS: Target date: 01/08/24  Pt to be I with HEP.  Baseline: Goal status: INITIAL  2.  Pt to be I with knack and pressure management with lifting mechanics to decreased strain at pelvic floor.  Baseline:  Goal status: INITIAL  3.  Pt to demonstrate improved coordination of pelvic floor and breathing mechanics with 10# squat with appropriate synergistic patterns to decrease pain and leakage at least 50% of the time.    Baseline:  Goal status: INITIAL   LONG TERM GOALS: Target date: 06/12/25  Pt to be I with advanced HEP.  Baseline:  Goal status: INITIAL  2.  Pt to demonstrate improved coordination of pelvic floor and breathing mechanics with 50# squat with appropriate synergistic patterns to decrease pain and leakage at least 75% of the time.    Baseline:  Goal status: INITIAL  3.  Pt will be able to run/workout for 30 minutes without leakage or discomfort  Baseline:  Goal status: INITIAL  4.  Pt to demonstrate no compensatory strategies for x10 single leg squats 10# each leg for improved tolerance to return to running with decreased strain at pelvic floor.  Baseline:  Goal status: INITIAL  5.  Pt to  report no more than 2/10  pain with intercourse due to improved mobility and lubrication.  Baseline:  Goal status: INITIAL  6.  Pt to demonstrate improved core activation consistently with exercises to improved DRA separation to maintain moderate tissue density.  Baseline:  Goal status: INITIAL  PLAN:  PT FREQUENCY: 1x/week  PT DURATION:  12 sessions  PLANNED INTERVENTIONS: 97110-Therapeutic exercises, 97530- Therapeutic activity, 97112- Neuromuscular re-education, 97535- Self Care, [id number]- Manual therapy, Patient/Family education, Taping, Dry Needling, Joint mobilization, Spinal mobilization, Scar mobilization, DME instructions, Cryotherapy, Moist heat, and Biofeedback  PLAN FOR NEXT SESSION: Assess response to DN, continue with transverse abdominis activation, glute and core strength, DRA based exercises (opp glute and lat motions), exhale with all push/pull/lifts   Solon Palm, PT  03/17/255:36 PM

## 2023-12-16 ENCOUNTER — Other Ambulatory Visit: Payer: Self-pay | Admitting: Student in an Organized Health Care Education/Training Program

## 2023-12-16 ENCOUNTER — Other Ambulatory Visit (HOSPITAL_COMMUNITY): Payer: Self-pay

## 2023-12-16 DIAGNOSIS — Z20828 Contact with and (suspected) exposure to other viral communicable diseases: Secondary | ICD-10-CM

## 2023-12-16 MED ORDER — OSELTAMIVIR PHOSPHATE 75 MG PO CAPS
75.0000 mg | ORAL_CAPSULE | Freq: Every day | ORAL | 0 refills | Status: AC
  Filled 2023-12-16: qty 10, 10d supply, fill #0

## 2023-12-16 NOTE — Progress Notes (Signed)
 Tamiflu for influenza post exposure prophylaxis.

## 2023-12-25 ENCOUNTER — Ambulatory Visit

## 2023-12-25 DIAGNOSIS — R262 Difficulty in walking, not elsewhere classified: Secondary | ICD-10-CM | POA: Diagnosis not present

## 2023-12-25 DIAGNOSIS — M6281 Muscle weakness (generalized): Secondary | ICD-10-CM

## 2023-12-25 DIAGNOSIS — R279 Unspecified lack of coordination: Secondary | ICD-10-CM

## 2023-12-25 DIAGNOSIS — R252 Cramp and spasm: Secondary | ICD-10-CM | POA: Diagnosis not present

## 2023-12-25 DIAGNOSIS — R293 Abnormal posture: Secondary | ICD-10-CM

## 2023-12-25 NOTE — Therapy (Signed)
 OUTPATIENT PHYSICAL THERAPY FEMALE PELVIC EVALUATION   Patient Name: Brooke Sherman MRN: [id number] DOB:May 22, 1986, 38 y.o., female Today's Date: 12/25/2023  END OF SESSION:  PT End of Session - 12/25/23 1405     Visit Number 3    Date for PT Re-Evaluation 06/12/24    Authorization Type cone    PT Start Time 1405    PT Stop Time 1445    PT Time Calculation (min) 40 min    Activity Tolerance Patient tolerated treatment well    Behavior During Therapy [facility] for tasks assessed/performed              Past Medical History:  Diagnosis Date   Carpal tunnel syndrome    Hypothyroid    Raynaud's disease    Past Surgical History:  Procedure Laterality Date   ANTERIOR CRUCIATE LIGAMENT REPAIR Left 2019   WISDOM TOOTH EXTRACTION     Patient Active Problem List   Diagnosis Date Noted   Elevated LFTs 08/27/2023   Hypercalcemia 08/27/2023   Acquired hypothyroidism 08/27/2023    PCP: Etta Grandchild, MD   REFERRING PROVIDER: Lennart Pall, MD  REFERRING DIAG: O70.9 (ICD-10-CM) - Perineal laceration during delivery, delivered Z39.2 (ICD-10-CM) - Postpartum exam  THERAPY DIAG:  Muscle weakness (generalized)  Abnormal posture  Difficulty in walking, not elsewhere classified  Cramp and spasm  Unspecified lack of coordination  Rationale for Evaluation and Treatment: Rehabilitation  ONSET DATE: 6 months postpartum  SUBJECTIVE:                                                                                                                                                                                           SUBJECTIVE STATEMENT: Patient reports she is doing ok.  Back is the main issue today.    Fluid intake: 80-100oz (breast feeding)  PAIN:  Are you having pain? Yes NPRS scale: 5/10 Pain location:  low back   Pain type: aching Pain description: dull   Aggravating factors: running Relieving factors: rest/ stretching  PRECAUTIONS: Other:  postpartum   RED FLAGS: None   WEIGHT BEARING RESTRICTIONS: No  FALLS:  Has patient fallen in last 6 months? No    ACTIVITY LEVEL : moderate   PLOF: Independent  PATIENT GOALS: to be stronger and be able to have strength for a 3rd baby   PERTINENT HISTORY:  Asthma, Carpal tunnel syndrome, Hypothyroid, and Raynaud's disease, ACL repair 2019, x2 vaginal births with 2nd deg tearing  Sexual abuse: No  BOWEL MOVEMENT: Pain with bowel movement: No Denies concerns   URINATION: Pain with urination: No Fully empty bladder: Yes:   Stream:  Strong Urgency: No Frequency: right at 2 hours Leakage:  possibly with a very strong cough/sneeze, doesn't think so with running Pads: No  INTERCOURSE:  Ability to have vaginal penetration Yes  Pain with intercourse: Initial Penetration DrynessYes  Climax: not painful Marinoff Scale: 0/3 Laxative:  PREGNANCY: Vaginal deliveries 2 Tearing Yes: grade 2 with first and second babies (sulcal and Rt labia second baby) Episiotomy No C-section deliveries 0 Currently pregnant No  PROLAPSE: Pressure Vaginally   OBJECTIVE:  Note: Objective measures were completed at Evaluation unless otherwise noted.  DIAGNOSTIC FINDINGS:    COGNITION: Overall cognitive status: Within functional limits for tasks assessed     SENSATION: Light touch: Appears intact  LUMBAR SPECIAL TESTS:  SI Compression/distraction test: Negative and Thomas test: Negative Hip drop bil with single leg stance  FUNCTIONAL TESTS:  Functional squat - good mechanics but decreased descent by 25% and slight breath holding  GAIT: WFL  POSTURE: rounded shoulders, forward head, and anterior pelvic tilt   LUMBARAROM/PROM:  A/PROM A/PROM  eval  Flexion WFL  Extension WFL  Right lateral flexion WFL  Left lateral flexion WFL  Right rotation Limited by 25%  Left rotation Limited by 25%   (Blank rows = not tested)  LOWER EXTREMITY ROM:  Bil  hip flexors and  hamstrings and adductors and IR/ER limited by 25%  LOWER EXTREMITY MMT:  Bil hips grossly 4+/5; knees 5/5 PALPATION:   General: tightness noted in bil thoracic and lumbar paraspinals   Pelvic Alignment: WFL  Abdominal: [facility]                External Perineal Exam: mild TTP at Rt labia (a previous tear site)                             Internal Pelvic Floor: mild TTP with tightness at Rt superficial pelvic floor   Patient confirms identification and approves PT to assess internal pelvic floor and treatment Yes No emotional/communication barriers or cognitive limitation. Patient is motivated to learn. Patient understands and agrees with treatment goals and plan. PT explains patient will be examined in standing, sitting, and lying down to see how their muscles and joints work. When they are ready, they will be asked to remove their underwear so PT can examine their perineum. The patient is also given the option of providing their own chaperone as one is not provided in our facility. The patient also has the right and is explained the right to defer or refuse any part of the evaluation or treatment including the internal exam. With the patient's consent, PT will use one gloved finger to gently assess the muscles of the pelvic floor, seeing how well it contracts and relaxes and if there is muscle symmetry. After, the patient will get dressed and PT and patient will discuss exam findings and plan of care. PT and patient discuss plan of care, schedule, attendance policy and HEP activities.  PELVIC MMT:   MMT eval  Vaginal 2/5>3/5 with reps 7s, 6 reps  Internal Anal Sphincter   External Anal Sphincter   Puborectalis   Diastasis Recti 1.5 finger width for 2 inches above umbilicus, 1 finger width below for 1 in. Poor density noted   (Blank rows = not tested)        TONE: WFL  PROLAPSE: Possible grade 1 anterior vaginal wall laxity with cough in hooklying   TODAY'S TREATMENT:  DATE:   12/25/23 Standing hamstring stretch 3 x 30 sec Standing quad stretch 3 x 30 sec Supine PPT with TA focus x 20 Supine PPT with 90/90 heel tap x 20 with manual abdominal bracing x 20 Quadruped PPT with TA focus x 20 (provided 10 lb dumbbells for more comfortable wrist position) Quadruped thread the needle with thoracic rotation Quadruped donkey kick x 10 on each LE Quadruped alternate arm and leg pull ins with vc's to maintain pelvic tilt x 10 each side Hook lying isometric ball press into knees x 20 holding approx 5 sec Hook lying opposite arm and leg isometric ball press x 10 each side holding 5 sec each Lower trunk rotation x 20 Trigger Point Dry Needling Subsequent Treatment: Instructions provided previously at initial dry needling treatment.  Patient Verbal Consent Given: Yes Education Handout Provided: Previously Provided Muscles Treated: lumbar multifidi Electrical Stimulation Performed: No Treatment Response/Outcome: Skilled palpation used to identify taut bands and trigger points.  Once identified, dry needling techniques used to treat these areas.  Twitch response ellicited on several levels along with palpable elongation of muscle R side > L.   Following treatment, patient reported mild soreness.      DATE:   12/15/23 Quadriped exercises completed on purple yoga mat on floor due to wrist pain when on mat table.  Quadriped opp arm/leg x 20 B cues  (verbal and tactile) for level pelvis Quad thoracic rotation x 10 Quad mule kicks x 10 B Prone opp/leg x 10 ea - some LBP 90/90 unilateral isometric press with opp hand to knee 5 sec hold x 10 B 90/90 heel taps x 10 B some LBP reported  Self Care: DN education and aftercare  Manual therapy: Trigger Point Dry Needling  Initial Treatment: Pt instructed on Dry Needling rational, procedures, and possible side effects. Pt  instructed to expect mild to moderate muscle soreness later in the day and/or into the next day.  Pt instructed in methods to reduce muscle soreness. Pt instructed to continue prescribed HEP. Patient was educated on signs and symptoms of infection and other risk factors and advised to seek medical attention should they occur.  Patient verbalized understanding of these instructions and education.   Patient Verbal Consent Given: Yes Education Handout Provided: Yes Muscles Treated: B lumbar multifidi L3-L5 Electrical Stimulation Performed: No Treatment Response/Outcome: Utilized skilled palpation to identify bony landmarks and trigger points.  Able to illicit twitch response and muscle elongation.  Soft tissue mobilization to B lumbar following DN to further promote tissue elongation and decreased pain.      12/11/23 EVAL Examination completed, findings reviewed, pt educated on POC, HEP, and importance of proper core activation x10 transverse abdominis activations completed to insure carry over for home and pt demonstrated improved DRA measurements with activating transverse abdominis for sit up. Pt also educated on knack to decreased stress at pelvic floor with coughing/sneezing and to decreased urinary incontinence with this. Pt motivated to participate in PT and agreeable to attempt recommendations.     PATIENT EDUCATION:  Education details: [id number] Person educated: Patient Education method: Explanation, Demonstration, Tactile cues, Verbal cues, and Handouts Education comprehension: verbalized understanding, returned demonstration, verbal cues required, tactile cues required, and needs further education  HOME EXERCISE PROGRAM: [id number]  ASSESSMENT:  CLINICAL IMPRESSION: Brooke Sherman was able to tolerate addition of supine core work along with hamstring and hip flexor/quad stretches.  She is having low back pain primarily.  She is very compliant and well motivated.  She  should continue to do  well.    Patient is a 38 y.o. female  who was seen today for physical therapy evaluation and treatment for postpartum weakness status post second degree lacerations  at sulcus and Rt labia with second vaginal delivery. Pt found to have decreased core strength and noted DRA separation with bulging with active sit up and 1.5 knuckle depth. Pt also demonstrated bil hip tightness and mild weakness and tightness in paraspinals. Does endorse low back pain after running for ~20 mins. Pt reports she has been trying to workout more but does note doming at abdomen with core work and has had a little urinary incontinence with stressors. Patient consented to internal pelvic floor assessment vaginally this date and found to have decreased strength, endurance, and coordination, tension at superficial pelvic floor and TTP at Rt side of superficial pelvic floor. Pt has history of tearing with past two vaginal deliveries and limitations in mobility noted along these areas. Pt did demonstrate anterior wall laxity with coughing but with cues for pelvic floor contraction then cough not noted. Pt would benefit from additional PT to further address deficits.    OBJECTIVE IMPAIRMENTS: decreased activity tolerance, decreased coordination, decreased endurance, decreased mobility, decreased strength, increased fascial restrictions, increased muscle spasms, impaired flexibility, improper body mechanics, postural dysfunction, and pain.   ACTIVITY LIMITATIONS: lifting, standing, squatting, continence, and locomotion level  PARTICIPATION LIMITATIONS: interpersonal relationship, community activity, and yard work  PERSONAL FACTORS: Fitness, Time since onset of injury/illness/exacerbation, and 1 comorbidity: medical history   are also affecting patient's functional outcome.   REHAB POTENTIAL: Good  CLINICAL DECISION MAKING: Stable/uncomplicated  EVALUATION COMPLEXITY: Low   GOALS: Goals reviewed with patient? Yes  SHORT TERM  GOALS: Target date: 01/08/24  Pt to be I with HEP.  Baseline: Goal status: In progress  2.  Pt to be I with knack and pressure management with lifting mechanics to decreased strain at pelvic floor.  Baseline:  Goal status: INITIAL  3.  Pt to demonstrate improved coordination of pelvic floor and breathing mechanics with 10# squat with appropriate synergistic patterns to decrease pain and leakage at least 50% of the time.    Baseline:  Goal status: INITIAL   LONG TERM GOALS: Target date: 06/12/25  Pt to be I with advanced HEP.  Baseline:  Goal status: INITIAL  2.  Pt to demonstrate improved coordination of pelvic floor and breathing mechanics with 50# squat with appropriate synergistic patterns to decrease pain and leakage at least 75% of the time.    Baseline:  Goal status: INITIAL  3.  Pt will be able to run/workout for 30 minutes without leakage or discomfort  Baseline:  Goal status: INITIAL  4.  Pt to demonstrate no compensatory strategies for x10 single leg squats 10# each leg for improved tolerance to return to running with decreased strain at pelvic floor.  Baseline:  Goal status: INITIAL  5.  Pt to report no more than 2/10  pain with intercourse due to improved mobility and lubrication.  Baseline:  Goal status: INITIAL  6.  Pt to demonstrate improved core activation consistently with exercises to improved DRA separation to maintain moderate tissue density.  Baseline:  Goal status: INITIAL  PLAN:  PT FREQUENCY: 1x/week  PT DURATION:  12 sessions  PLANNED INTERVENTIONS: 97110-Therapeutic exercises, 97530- Therapeutic activity, 97112- Neuromuscular re-education, 97535- Self Care, 16109- Manual therapy, Patient/Family education, Taping, Dry Needling, Joint mobilization, Spinal mobilization, Scar mobilization, DME instructions, Cryotherapy, Moist heat, and Biofeedback  PLAN FOR NEXT SESSION: Assess response to DN # 2, continue with transverse abdominis activation,  glute and core strength, DRA based exercises (opp glute and lat motions), exhale with all push/pull/lifts   Victorino Dike B. Kaiden Dardis, PT 12/25/23 3:31 PM [facility] [address], [address] [place], [place] [address] Phone # [phone] Fax [phone]

## 2023-12-30 ENCOUNTER — Ambulatory Visit: Payer: 59 | Attending: Obstetrics and Gynecology | Admitting: Physical Therapy

## 2023-12-30 DIAGNOSIS — R252 Cramp and spasm: Secondary | ICD-10-CM | POA: Insufficient documentation

## 2023-12-30 DIAGNOSIS — R279 Unspecified lack of coordination: Secondary | ICD-10-CM | POA: Diagnosis not present

## 2023-12-30 DIAGNOSIS — M6281 Muscle weakness (generalized): Secondary | ICD-10-CM | POA: Insufficient documentation

## 2023-12-30 DIAGNOSIS — R293 Abnormal posture: Secondary | ICD-10-CM | POA: Insufficient documentation

## 2023-12-30 NOTE — Therapy (Signed)
 OUTPATIENT PHYSICAL THERAPY FEMALE PELVIC EVALUATION   Patient Name: Brooke Sherman MRN: [id number] DOB:11/17/1985, 38 y.o., female Today's Date: 12/30/2023  END OF SESSION:  PT End of Session - 12/30/23 0932     Visit Number 4    Date for PT Re-Evaluation 06/12/24    Authorization Type cone    PT Start Time 0930    PT Stop Time 1014    PT Time Calculation (min) 44 min    Activity Tolerance Patient tolerated treatment well    Behavior During Therapy [facility] for tasks assessed/performed              Past Medical History:  Diagnosis Date   Carpal tunnel syndrome    Hypothyroid    Raynaud's disease    Past Surgical History:  Procedure Laterality Date   ANTERIOR CRUCIATE LIGAMENT REPAIR Left 2019   WISDOM TOOTH EXTRACTION     Patient Active Problem List   Diagnosis Date Noted   Elevated LFTs 08/27/2023   Hypercalcemia 08/27/2023   Acquired hypothyroidism 08/27/2023    PCP: Etta Grandchild, MD   REFERRING PROVIDER: Lennart Pall, MD  REFERRING DIAG: O70.9 (ICD-10-CM) - Perineal laceration during delivery, delivered Z39.2 (ICD-10-CM) - Postpartum exam  THERAPY DIAG:  Muscle weakness (generalized)  Abnormal posture  Unspecified lack of coordination  Cramp and spasm  Rationale for Evaluation and Treatment: Rehabilitation  ONSET DATE: 6 months postpartum  SUBJECTIVE:                                                                                                                                                                                           SUBJECTIVE STATEMENT: Pt reports she was sore after stretching but went well. Pt reports she is feeling core activation more but does still has low back pain.  Fluid intake: 80-100oz (breast feeding)  PAIN:  Are you having pain? Yes NPRS scale: 3/10 Pain location:  low back   Pain type: aching Pain description: dull   Aggravating factors: after working out, or long day with kids  alone Relieving factors: rest/ stretching  PRECAUTIONS: Other: postpartum   RED FLAGS: None   WEIGHT BEARING RESTRICTIONS: No  FALLS:  Has patient fallen in last 6 months? No    ACTIVITY LEVEL : moderate   PLOF: Independent  PATIENT GOALS: to be stronger and be able to have strength for a 3rd baby   PERTINENT HISTORY:  Asthma, Carpal tunnel syndrome, Hypothyroid, and Raynaud's disease, ACL repair 2019, x2 vaginal births with 2nd deg tearing  Sexual abuse: No  BOWEL MOVEMENT: Pain with bowel movement: No Denies concerns   URINATION:  Pain with urination: No Fully empty bladder: Yes:   Stream: Strong Urgency: No Frequency: right at 2 hours Leakage:  possibly with a very strong cough/sneeze, doesn't think so with running Pads: No  INTERCOURSE:  Ability to have vaginal penetration Yes  Pain with intercourse: Initial Penetration DrynessYes  Climax: not painful Marinoff Scale: 0/3 Laxative:  PREGNANCY: Vaginal deliveries 2 Tearing Yes: grade 2 with first and second babies (sulcal and Rt labia second baby) Episiotomy No C-section deliveries 0 Currently pregnant No  PROLAPSE: Pressure Vaginally   OBJECTIVE:  Note: Objective measures were completed at Evaluation unless otherwise noted.  DIAGNOSTIC FINDINGS:    COGNITION: Overall cognitive status: Within functional limits for tasks assessed     SENSATION: Light touch: Appears intact  LUMBAR SPECIAL TESTS:  SI Compression/distraction test: Negative and Thomas test: Negative Hip drop bil with single leg stance  FUNCTIONAL TESTS:  Functional squat - good mechanics but decreased descent by 25% and slight breath holding  GAIT: WFL  POSTURE: rounded shoulders, forward head, and anterior pelvic tilt   LUMBARAROM/PROM:  A/PROM A/PROM  eval  Flexion WFL  Extension WFL  Right lateral flexion WFL  Left lateral flexion WFL  Right rotation Limited by 25%  Left rotation Limited by 25%   (Blank rows =  not tested)  LOWER EXTREMITY ROM:  Bil  hip flexors and hamstrings and adductors and IR/ER limited by 25%  LOWER EXTREMITY MMT:  Bil hips grossly 4+/5; knees 5/5 PALPATION:   General: tightness noted in bil thoracic and lumbar paraspinals   Pelvic Alignment: WFL  Abdominal: [facility]                External Perineal Exam: mild TTP at Rt labia (a previous tear site)                             Internal Pelvic Floor: mild TTP with tightness at Rt superficial pelvic floor   Patient confirms identification and approves PT to assess internal pelvic floor and treatment Yes No emotional/communication barriers or cognitive limitation. Patient is motivated to learn. Patient understands and agrees with treatment goals and plan. PT explains patient will be examined in standing, sitting, and lying down to see how their muscles and joints work. When they are ready, they will be asked to remove their underwear so PT can examine their perineum. The patient is also given the option of providing their own chaperone as one is not provided in our facility. The patient also has the right and is explained the right to defer or refuse any part of the evaluation or treatment including the internal exam. With the patient's consent, PT will use one gloved finger to gently assess the muscles of the pelvic floor, seeing how well it contracts and relaxes and if there is muscle symmetry. After, the patient will get dressed and PT and patient will discuss exam findings and plan of care. PT and patient discuss plan of care, schedule, attendance policy and HEP activities.  PELVIC MMT:   MMT eval  Vaginal 2/5>3/5 with reps 7s, 6 reps  Internal Anal Sphincter   External Anal Sphincter   Puborectalis   Diastasis Recti 1.5 finger width for 2 inches above umbilicus, 1 finger width below for 1 in. Poor density noted   (Blank rows = not tested)        TONE: WFL  PROLAPSE: Possible grade 1 anterior vaginal wall laxity with  cough  in hooklying   TODAY'S TREATMENT:                                                                                                                              DATE:   12/30/23: Hooklying yoga block unilaterally with pelvic shift Rt block more of a stretch 3x30s here Windshield wipers x10 Quad yoga block transverse abdominis activation with unilateral lifts 2x10 each 10# single leg Sit to stand 2x10 2x10 single leg heel raises Red loop sidelying full clams 2x10 each Fire hydrants 2x10 red loop  Heel drops - towel rolls for feedback for transverse abdominis activation 2x10 Opp hand/knee 3s press x10 Standing hamstring stretch 3x30s Standing quad stretch 3x30s   12/25/23 Standing hamstring stretch 3 x 30 sec Standing quad stretch 3 x 30 sec Supine PPT with TA focus x 20 Supine PPT with 90/90 heel tap x 20 with manual abdominal bracing x 20 Quadruped PPT with TA focus x 20 (provided 10 lb dumbbells for more comfortable wrist position) Quadruped thread the needle with thoracic rotation Quadruped donkey kick x 10 on each LE Quadruped alternate arm and leg pull ins with vc's to maintain pelvic tilt x 10 each side Hook lying isometric ball press into knees x 20 holding approx 5 sec Hook lying opposite arm and leg isometric ball press x 10 each side holding 5 sec each Lower trunk rotation x 20 Trigger Point Dry Needling Subsequent Treatment: Instructions provided previously at initial dry needling treatment.  Patient Verbal Consent Given: Yes Education Handout Provided: Previously Provided Muscles Treated: lumbar multifidi Electrical Stimulation Performed: No Treatment Response/Outcome: Skilled palpation used to identify taut bands and trigger points.  Once identified, dry needling techniques used to treat these areas.  Twitch response ellicited on several levels along with palpable elongation of muscle R side > L.   Following treatment, patient reported mild soreness.       12/15/23 Quadriped exercises completed on purple yoga mat on floor due to wrist pain when on mat table.  Quadriped opp arm/leg x 20 B cues  (verbal and tactile) for level pelvis Quad thoracic rotation x 10 Quad mule kicks x 10 B Prone opp/leg x 10 ea - some LBP 90/90 unilateral isometric press with opp hand to knee 5 sec hold x 10 B 90/90 heel taps x 10 B some LBP reported  Self Care: DN education and aftercare  Manual therapy: Trigger Point Dry Needling  Initial Treatment: Pt instructed on Dry Needling rational, procedures, and possible side effects. Pt instructed to expect mild to moderate muscle soreness later in the day and/or into the next day.  Pt instructed in methods to reduce muscle soreness. Pt instructed to continue prescribed HEP. Patient was educated on signs and symptoms of infection and other risk factors and advised to seek medical attention should they occur.  Patient verbalized understanding of these instructions and education.   Patient Verbal Consent Given: Yes Education Handout Provided:  Yes Muscles Treated: B lumbar multifidi L3-L5 Electrical Stimulation Performed: No Treatment Response/Outcome: Utilized skilled palpation to identify bony landmarks and trigger points.  Able to illicit twitch response and muscle elongation.  Soft tissue mobilization to B lumbar following DN to further promote tissue elongation and decreased pain.      12/11/23 EVAL Examination completed, findings reviewed, pt educated on POC, HEP, and importance of proper core activation x10 transverse abdominis activations completed to insure carry over for home and pt demonstrated improved DRA measurements with activating transverse abdominis for sit up. Pt also educated on knack to decreased stress at pelvic floor with coughing/sneezing and to decreased urinary incontinence with this. Pt motivated to participate in PT and agreeable to attempt recommendations.     PATIENT EDUCATION:   Education details: [id number] Person educated: Patient Education method: Programmer, multimedia, Demonstration, Tactile cues, Verbal cues, and Handouts Education comprehension: verbalized understanding, returned demonstration, verbal cues required, tactile cues required, and needs further education  HOME EXERCISE PROGRAM: [id number]  ASSESSMENT:  CLINICAL IMPRESSION: Pt presents for treatment and reports she is feeling a little better overall but back pain still present and still has been unable to return to running. Session focused on hip and core strengthing, did progress to single leg strengthening to improve pelvic stability and decreased back pain to return to running. Pt would benefit from additional PT to further address deficits.    OBJECTIVE IMPAIRMENTS: decreased activity tolerance, decreased coordination, decreased endurance, decreased mobility, decreased strength, increased fascial restrictions, increased muscle spasms, impaired flexibility, improper body mechanics, postural dysfunction, and pain.   ACTIVITY LIMITATIONS: lifting, standing, squatting, continence, and locomotion level  PARTICIPATION LIMITATIONS: interpersonal relationship, community activity, and yard work  PERSONAL FACTORS: Fitness, Time since onset of injury/illness/exacerbation, and 1 comorbidity: medical history   are also affecting patient's functional outcome.   REHAB POTENTIAL: Good  CLINICAL DECISION MAKING: Stable/uncomplicated  EVALUATION COMPLEXITY: Low   GOALS: Goals reviewed with patient? Yes  SHORT TERM GOALS: Target date: 01/08/24  Pt to be I with HEP.  Baseline: Goal status: In progress  2.  Pt to be I with knack and pressure management with lifting mechanics to decreased strain at pelvic floor.  Baseline:  Goal status: INITIAL  3.  Pt to demonstrate improved coordination of pelvic floor and breathing mechanics with 10# squat with appropriate synergistic patterns to decrease pain and leakage at  least 50% of the time.    Baseline:  Goal status: INITIAL   LONG TERM GOALS: Target date: 06/12/25  Pt to be I with advanced HEP.  Baseline:  Goal status: INITIAL  2.  Pt to demonstrate improved coordination of pelvic floor and breathing mechanics with 50# squat with appropriate synergistic patterns to decrease pain and leakage at least 75% of the time.    Baseline:  Goal status: INITIAL  3.  Pt will be able to run/workout for 30 minutes without leakage or discomfort  Baseline:  Goal status: INITIAL  4.  Pt to demonstrate no compensatory strategies for x10 single leg squats 10# each leg for improved tolerance to return to running with decreased strain at pelvic floor.  Baseline:  Goal status: INITIAL  5.  Pt to report no more than 2/10  pain with intercourse due to improved mobility and lubrication.  Baseline:  Goal status: INITIAL  6.  Pt to demonstrate improved core activation consistently with exercises to improved DRA separation to maintain moderate tissue density.  Baseline:  Goal status: INITIAL  PLAN:  PT FREQUENCY:  1x/week  PT DURATION:  12 sessions  PLANNED INTERVENTIONS: 97110-Therapeutic exercises, 97530- Therapeutic activity, 97112- Neuromuscular re-education, 97535- Self Care, [id number]- Manual therapy, Patient/Family education, Taping, Dry Needling, Joint mobilization, Spinal mobilization, Scar mobilization, DME instructions, Cryotherapy, Moist heat, and Biofeedback  PLAN FOR NEXT SESSION: Assess response to DN # 2, continue with transverse abdominis activation, glute and core strength, DRA based exercises (opp glute and lat motions), exhale with all push/pull/lifts  Otelia Sergeant, PT, DPT 12/29/2508:18 AM  [facility] [address] [place], [place] [address] Phone # [phone] Fax [phone]

## 2024-01-07 ENCOUNTER — Ambulatory Visit

## 2024-01-07 ENCOUNTER — Ambulatory Visit: Admitting: Physical Therapy

## 2024-01-07 DIAGNOSIS — R252 Cramp and spasm: Secondary | ICD-10-CM

## 2024-01-07 DIAGNOSIS — M6281 Muscle weakness (generalized): Secondary | ICD-10-CM

## 2024-01-07 DIAGNOSIS — R279 Unspecified lack of coordination: Secondary | ICD-10-CM

## 2024-01-07 DIAGNOSIS — R293 Abnormal posture: Secondary | ICD-10-CM | POA: Diagnosis not present

## 2024-01-07 NOTE — Therapy (Signed)
 OUTPATIENT PHYSICAL THERAPY FEMALE PELVIC TREATMENT   Patient Name: Brooke Sherman MRN: [id number] DOB:Jan 29, 1986, 38 y.o., female Today's Date: 01/07/2024  END OF SESSION:  PT End of Session - 01/07/24 1149     Visit Number 5    Date for PT Re-Evaluation 06/12/24    Authorization Type cone    PT Start Time 1148    PT Stop Time 1230    PT Time Calculation (min) 42 min    Activity Tolerance Patient tolerated treatment well    Behavior During Therapy [facility] for tasks assessed/performed              Past Medical History:  Diagnosis Date   Carpal tunnel syndrome    Hypothyroid    Raynaud's disease    Past Surgical History:  Procedure Laterality Date   ANTERIOR CRUCIATE LIGAMENT REPAIR Left 2019   WISDOM TOOTH EXTRACTION     Patient Active Problem List   Diagnosis Date Noted   Elevated LFTs 08/27/2023   Hypercalcemia 08/27/2023   Acquired hypothyroidism 08/27/2023    PCP: Etta Grandchild, MD   REFERRING PROVIDER: Lennart Pall, MD  REFERRING DIAG: O70.9 (ICD-10-CM) - Perineal laceration during delivery, delivered Z39.2 (ICD-10-CM) - Postpartum exam  THERAPY DIAG:  Muscle weakness (generalized)  Abnormal posture  Unspecified lack of coordination  Cramp and spasm  Rationale for Evaluation and Treatment: Rehabilitation  ONSET DATE: 6 months postpartum  SUBJECTIVE:                                                                                                                                                                                           SUBJECTIVE STATEMENT: Pt reports a lot of low back tightness and soreness today has been tolerating workouts well at home but just feels tight today.   Fluid intake: 80-100oz (breast feeding)  PAIN:  Are you having pain? Yes NPRS scale: 3/10 Pain location:  low back   Pain type: aching, soreness Pain description: dull   Aggravating factors: after working out, or long day with kids  alone Relieving factors: rest/ stretching  PRECAUTIONS: Other: postpartum   RED FLAGS: None   WEIGHT BEARING RESTRICTIONS: No  FALLS:  Has patient fallen in last 6 months? No    ACTIVITY LEVEL : moderate   PLOF: Independent  PATIENT GOALS: to be stronger and be able to have strength for a 3rd baby   PERTINENT HISTORY:  Asthma, Carpal tunnel syndrome, Hypothyroid, and Raynaud's disease, ACL repair 2019, x2 vaginal births with 2nd deg tearing  Sexual abuse: No  BOWEL MOVEMENT: Pain with bowel movement: No Denies concerns   URINATION:  Pain with urination: No Fully empty bladder: Yes:   Stream: Strong Urgency: No Frequency: right at 2 hours Leakage:  possibly with a very strong cough/sneeze, doesn't think so with running Pads: No  INTERCOURSE:  Ability to have vaginal penetration Yes  Pain with intercourse: Initial Penetration DrynessYes  Climax: not painful Marinoff Scale: 0/3 Laxative:  PREGNANCY: Vaginal deliveries 2 Tearing Yes: grade 2 with first and second babies (sulcal and Rt labia second baby) Episiotomy No C-section deliveries 0 Currently pregnant No  PROLAPSE: Pressure Vaginally   OBJECTIVE:  Note: Objective measures were completed at Evaluation unless otherwise noted.  DIAGNOSTIC FINDINGS:    COGNITION: Overall cognitive status: Within functional limits for tasks assessed     SENSATION: Light touch: Appears intact  LUMBAR SPECIAL TESTS:  SI Compression/distraction test: Negative and Thomas test: Negative Hip drop bil with single leg stance  FUNCTIONAL TESTS:  Functional squat - good mechanics but decreased descent by 25% and slight breath holding  GAIT: WFL  POSTURE: rounded shoulders, forward head, and anterior pelvic tilt   LUMBARAROM/PROM:  A/PROM A/PROM  eval  Flexion WFL  Extension WFL  Right lateral flexion WFL  Left lateral flexion WFL  Right rotation Limited by 25%  Left rotation Limited by 25%   (Blank rows =  not tested)  LOWER EXTREMITY ROM:  Bil  hip flexors and hamstrings and adductors and IR/ER limited by 25%  LOWER EXTREMITY MMT:  Bil hips grossly 4+/5; knees 5/5 PALPATION:   General: tightness noted in bil thoracic and lumbar paraspinals   Pelvic Alignment: WFL  Abdominal: [facility]                External Perineal Exam: mild TTP at Rt labia (a previous tear site)                             Internal Pelvic Floor: mild TTP with tightness at Rt superficial pelvic floor   Patient confirms identification and approves PT to assess internal pelvic floor and treatment Yes No emotional/communication barriers or cognitive limitation. Patient is motivated to learn. Patient understands and agrees with treatment goals and plan. PT explains patient will be examined in standing, sitting, and lying down to see how their muscles and joints work. When they are ready, they will be asked to remove their underwear so PT can examine their perineum. The patient is also given the option of providing their own chaperone as one is not provided in our facility. The patient also has the right and is explained the right to defer or refuse any part of the evaluation or treatment including the internal exam. With the patient's consent, PT will use one gloved finger to gently assess the muscles of the pelvic floor, seeing how well it contracts and relaxes and if there is muscle symmetry. After, the patient will get dressed and PT and patient will discuss exam findings and plan of care. PT and patient discuss plan of care, schedule, attendance policy and HEP activities.  PELVIC MMT:   MMT eval 01/07/24  Vaginal 2/5>3/5 with reps 7s, 6 reps   Internal Anal Sphincter    External Anal Sphincter    Puborectalis    Diastasis Recti 1.5 finger width for 2 inches above umbilicus, 1 finger width below for 1 in. Poor density noted  1 finger separation above and below, does still have ~1st knuckle depth of separation but improving    (Blank rows =  not tested)        TONE: WFL  PROLAPSE: Possible grade 1 anterior vaginal wall laxity with cough in hooklying   TODAY'S TREATMENT:                                                                                                                              DATE:   01/07/24: Trigger Point Dry Needling Subsequent Treatment: Instructions provided previously at initial dry needling treatment.   Patient Verbal Consent Given: Yes Education Handout Provided: Previously Provided Muscles Treated: Lt multifidi L3-5  Electrical Stimulation Performed: No Treatment Response/Outcome: Skilled palpation used to identify taut bands and trigger points.  Once identified, dry needling techniques used to treat these areas.  Twitch response ellicited on several levels along with palpable elongation of muscle Lt multifidi.   Addday L1-2 used at Lt thoracic and lumbar paraspinal with good effect - pt reports no soreness at end of this Open books x10, 2x30s holds Cat/cow x10 Pelvic shift hooklying with yoga block 2x30s each   12/30/23: Hooklying yoga block unilaterally with pelvic shift Rt block more of a stretch 3x30s here Windshield wipers x10 Quad yoga block transverse abdominis activation with unilateral lifts 2x10 each 10# single leg Sit to stand 2x10 2x10 single leg heel raises Red loop sidelying full clams 2x10 each Fire hydrants 2x10 red loop  Heel drops - towel rolls for feedback for transverse abdominis activation 2x10 Opp hand/knee 3s press x10 Standing hamstring stretch 3x30s Standing quad stretch 3x30s   12/25/23 Standing hamstring stretch 3 x 30 sec Standing quad stretch 3 x 30 sec Supine PPT with TA focus x 20 Supine PPT with 90/90 heel tap x 20 with manual abdominal bracing x 20 Quadruped PPT with TA focus x 20 (provided 10 lb dumbbells for more comfortable wrist position) Quadruped thread the needle with thoracic rotation Quadruped donkey kick x 10 on each  LE Quadruped alternate arm and leg pull ins with vc's to maintain pelvic tilt x 10 each side Hook lying isometric ball press into knees x 20 holding approx 5 sec Hook lying opposite arm and leg isometric ball press x 10 each side holding 5 sec each Lower trunk rotation x 20 Trigger Point Dry Needling Subsequent Treatment: Instructions provided previously at initial dry needling treatment.  Patient Verbal Consent Given: Yes Education Handout Provided: Previously Provided Muscles Treated: lumbar multifidi Electrical Stimulation Performed: No Treatment Response/Outcome: Skilled palpation used to identify taut bands and trigger points.  Once identified, dry needling techniques used to treat these areas.  Twitch response ellicited on several levels along with palpable elongation of muscle R side > L.   Following treatment, patient reported mild soreness.      12/15/23 Quadriped exercises completed on purple yoga mat on floor due to wrist pain when on mat table.  Quadriped opp arm/leg x 20 B cues  (verbal and tactile) for level pelvis Quad thoracic rotation x 10 Quad mule kicks  x 10 B Prone opp/leg x 10 ea - some LBP 90/90 unilateral isometric press with opp hand to knee 5 sec hold x 10 B 90/90 heel taps x 10 B some LBP reported  Self Care: DN education and aftercare  Manual therapy: Trigger Point Dry Needling  Initial Treatment: Pt instructed on Dry Needling rational, procedures, and possible side effects. Pt instructed to expect mild to moderate muscle soreness later in the day and/or into the next day.  Pt instructed in methods to reduce muscle soreness. Pt instructed to continue prescribed HEP. Patient was educated on signs and symptoms of infection and other risk factors and advised to seek medical attention should they occur.  Patient verbalized understanding of these instructions and education.   Patient Verbal Consent Given: Yes Education Handout Provided: Yes Muscles Treated: B  lumbar multifidi L3-L5 Electrical Stimulation Performed: No Treatment Response/Outcome: Utilized skilled palpation to identify bony landmarks and trigger points.  Able to illicit twitch response and muscle elongation.  Soft tissue mobilization to B lumbar following DN to further promote tissue elongation and decreased pain.    PATIENT EDUCATION:  Education details: [id number] Person educated: Patient Education method: Explanation, Demonstration, Tactile cues, Verbal cues, and Handouts Education comprehension: verbalized understanding, returned demonstration, verbal cues required, tactile cues required, and needs further education  HOME EXERCISE PROGRAM: [id number]  ASSESSMENT:  CLINICAL IMPRESSION: Pt presents for treatment and reports she feels she is tolerating workouts at home well, denies pressure/pain. Session focused on stretching today as pt reported low back soreness and pt tolerated well improvement reported and demonstrated at end of session. Pt demonstrated improvement with DRA separation today as well, progressing toward goals. Pt would benefit from additional PT to further address deficits.    OBJECTIVE IMPAIRMENTS: decreased activity tolerance, decreased coordination, decreased endurance, decreased mobility, decreased strength, increased fascial restrictions, increased muscle spasms, impaired flexibility, improper body mechanics, postural dysfunction, and pain.   ACTIVITY LIMITATIONS: lifting, standing, squatting, continence, and locomotion level  PARTICIPATION LIMITATIONS: interpersonal relationship, community activity, and yard work  PERSONAL FACTORS: Fitness, Time since onset of injury/illness/exacerbation, and 1 comorbidity: medical history   are also affecting patient's functional outcome.   REHAB POTENTIAL: Good  CLINICAL DECISION MAKING: Stable/uncomplicated  EVALUATION COMPLEXITY: Low   GOALS: Goals reviewed with patient? Yes  SHORT TERM GOALS: Target date:  01/08/24 Goals updated 01/07/24 Pt to be I with HEP.  Baseline: Goal status: MET  2.  Pt to be I with knack and pressure management with lifting mechanics to decreased strain at pelvic floor.  Baseline:  Goal status: MET  3.  Pt to demonstrate improved coordination of pelvic floor and breathing mechanics with 10# squat with appropriate synergistic patterns to decrease pain and leakage at least 50% of the time.    Baseline:  Goal status:MET   LONG TERM GOALS: Target date: 06/12/25 Goals updated 01/07/24 Pt to be I with advanced HEP.  Baseline:  Goal status: on going  2.  Pt to demonstrate improved coordination of pelvic floor and breathing mechanics with 50# squat with appropriate synergistic patterns to decrease pain and leakage at least 75% of the time.    Baseline:  Goal status: on going  3.  Pt will be able to run/workout for 30 minutes without leakage or discomfort  Baseline:  Goal status: on going  4.  Pt to demonstrate no compensatory strategies for x10 single leg squats 10# each leg for improved tolerance to return to running with decreased strain at pelvic floor.  Baseline:  Goal status: on going  5.  Pt to report no more than 2/10  pain with intercourse due to improved mobility and lubrication.  Baseline:  Goal status: on going  6.  Pt to demonstrate improved core activation consistently with exercises to improved DRA separation to maintain moderate tissue density.  Baseline:  Goal status: on going  PLAN:  PT FREQUENCY: 1x/week  PT DURATION:  12 sessions  PLANNED INTERVENTIONS: 97110-Therapeutic exercises, 97530- Therapeutic activity, 97112- Neuromuscular re-education, 97535- Self Care, 16109- Manual therapy, Patient/Family education, Taping, Dry Needling, Joint mobilization, Spinal mobilization, Scar mobilization, DME instructions, Cryotherapy, Moist heat, and Biofeedback  PLAN FOR NEXT SESSION: Assess response to DN # 2, continue with transverse abdominis  activation, glute and core strength, DRA based exercises (opp glute and lat motions), exhale with all push/pull/lifts  Otelia Sergeant, PT, DPT 04/09/252:07 PM  [facility] [address] [place], [place] [address] Phone # [phone] Fax [phone]

## 2024-01-14 ENCOUNTER — Ambulatory Visit

## 2024-01-19 ENCOUNTER — Ambulatory Visit

## 2024-01-19 DIAGNOSIS — M6281 Muscle weakness (generalized): Secondary | ICD-10-CM | POA: Diagnosis not present

## 2024-01-19 DIAGNOSIS — R252 Cramp and spasm: Secondary | ICD-10-CM | POA: Diagnosis not present

## 2024-01-19 DIAGNOSIS — R279 Unspecified lack of coordination: Secondary | ICD-10-CM | POA: Diagnosis not present

## 2024-01-19 DIAGNOSIS — R293 Abnormal posture: Secondary | ICD-10-CM | POA: Diagnosis not present

## 2024-01-19 NOTE — Therapy (Signed)
 OUTPATIENT PHYSICAL THERAPY FEMALE PELVIC TREATMENT   Patient Name: Brooke Sherman MRN: [id number] DOB:1985/11/13, 38 y.o., female Today's Date: 01/19/2024  END OF SESSION:  PT End of Session - 01/19/24 1012     Visit Number 6    Date for PT Re-Evaluation 06/12/24    Authorization Type cone    PT Start Time 0931    PT Stop Time 1014    PT Time Calculation (min) 43 min    Activity Tolerance Patient tolerated treatment well    Behavior During Therapy [facility] for tasks assessed/performed               Past Medical History:  Diagnosis Date   Carpal tunnel syndrome    Hypothyroid    Raynaud's disease    Past Surgical History:  Procedure Laterality Date   ANTERIOR CRUCIATE LIGAMENT REPAIR Left 2019   WISDOM TOOTH EXTRACTION     Patient Active Problem List   Diagnosis Date Noted   Elevated LFTs 08/27/2023   Hypercalcemia 08/27/2023   Acquired hypothyroidism 08/27/2023    PCP: Arcadio Knuckles, MD   REFERRING PROVIDER: Izell Marsh, MD  REFERRING DIAG: O70.9 (ICD-10-CM) - Perineal laceration during delivery, delivered Z39.2 (ICD-10-CM) - Postpartum exam  THERAPY DIAG:  Muscle weakness (generalized)  Abnormal posture  Unspecified lack of coordination  Cramp and spasm  Rationale for Evaluation and Treatment: Rehabilitation  ONSET DATE: 6 months postpartum  SUBJECTIVE:                                                                                                                                                                                           SUBJECTIVE STATEMENT: I tried running and I have been keeping it to 20 min and 2 miles each.  I feel tight in my low back.  Dry needling helps at the time but I don't notice that I am any less tight after.    Fluid intake: 80-100oz (breast feeding)  PAIN:  Are you having pain? Yes NPRS scale: 0/10- just stiffness  Pain location:  low back   Pain type: aching, soreness Pain description:  dull   Aggravating factors: after working out, or long day with kids alone Relieving factors: rest/ stretching  PRECAUTIONS: Other: postpartum   RED FLAGS: None   WEIGHT BEARING RESTRICTIONS: No  FALLS:  Has patient fallen in last 6 months? No    ACTIVITY LEVEL : moderate   PLOF: Independent  PATIENT GOALS: to be stronger and be able to have strength for a 3rd baby   PERTINENT HISTORY:  Asthma, Carpal tunnel syndrome, Hypothyroid, and Raynaud's disease, ACL repair 2019,  x2 vaginal births with 2nd deg tearing  Sexual abuse: No  BOWEL MOVEMENT: Pain with bowel movement: No Denies concerns   URINATION: Pain with urination: No Fully empty bladder: Yes:   Stream: Strong Urgency: No Frequency: right at 2 hours Leakage:  possibly with a very strong cough/sneeze, doesn't think so with running Pads: No  INTERCOURSE:  Ability to have vaginal penetration Yes  Pain with intercourse: Initial Penetration DrynessYes  Climax: not painful Marinoff Scale: 0/3 Laxative:  PREGNANCY: Vaginal deliveries 2 Tearing Yes: grade 2 with first and second babies (sulcal and Rt labia second baby) Episiotomy No C-section deliveries 0 Currently pregnant No  PROLAPSE: Pressure Vaginally   OBJECTIVE:  Note: Objective measures were completed at Evaluation unless otherwise noted.  DIAGNOSTIC FINDINGS:    COGNITION: Overall cognitive status: Within functional limits for tasks assessed     SENSATION: Light touch: Appears intact  LUMBAR SPECIAL TESTS:  SI Compression/distraction test: Negative and Thomas test: Negative Hip drop bil with single leg stance  FUNCTIONAL TESTS:  Functional squat - good mechanics but decreased descent by 25% and slight breath holding  GAIT: WFL  POSTURE: rounded shoulders, forward head, and anterior pelvic tilt   LUMBARAROM/PROM:  A/PROM A/PROM  eval  Flexion WFL  Extension WFL  Right lateral flexion WFL  Left lateral flexion WFL  Right  rotation Limited by 25%  Left rotation Limited by 25%   (Blank rows = not tested)  LOWER EXTREMITY ROM:  Bil  hip flexors and hamstrings and adductors and IR/ER limited by 25%  LOWER EXTREMITY MMT:  Bil hips grossly 4+/5; knees 5/5 PALPATION:   General: tightness noted in bil thoracic and lumbar paraspinals   Pelvic Alignment: WFL  Abdominal: [facility]                External Perineal Exam: mild TTP at Rt labia (a previous tear site)                             Internal Pelvic Floor: mild TTP with tightness at Rt superficial pelvic floor   Patient confirms identification and approves PT to assess internal pelvic floor and treatment Yes No emotional/communication barriers or cognitive limitation. Patient is motivated to learn. Patient understands and agrees with treatment goals and plan. PT explains patient will be examined in standing, sitting, and lying down to see how their muscles and joints work. When they are ready, they will be asked to remove their underwear so PT can examine their perineum. The patient is also given the option of providing their own chaperone as one is not provided in our facility. The patient also has the right and is explained the right to defer or refuse any part of the evaluation or treatment including the internal exam. With the patient's consent, PT will use one gloved finger to gently assess the muscles of the pelvic floor, seeing how well it contracts and relaxes and if there is muscle symmetry. After, the patient will get dressed and PT and patient will discuss exam findings and plan of care. PT and patient discuss plan of care, schedule, attendance policy and HEP activities.  PELVIC MMT:   MMT eval 01/07/24  Vaginal 2/5>3/5 with reps 7s, 6 reps   Internal Anal Sphincter    External Anal Sphincter    Puborectalis    Diastasis Recti 1.5 finger width for 2 inches above umbilicus, 1 finger width below for 1 in. Poor density  noted  1 finger separation above and  below, does still have ~1st knuckle depth of separation but improving   (Blank rows = not tested)        TONE: WFL  PROLAPSE: Possible grade 1 anterior vaginal wall laxity with cough in hooklying   TODAY'S TREATMENT:                                                                                                                              DATE:    01/19/24: NuStep: Level 5x 6 minutes-PT present to discuss progress  Seated hamstring stretch: 2x20 seconds  Hooklying yoga block unilaterally with pelvic shift Rt block more of a stretch 3x30s here Red loop sidelying full clams 2x10 each Reverse clam: red band 2x10 bil each Open book with foam roll x10 each Fire hydrants 2x10 red loop  Windshield wipers x10 10# single leg Sit to stand 2x10  01/07/24: Trigger Point Dry Needling Subsequent Treatment: Instructions provided previously at initial dry needling treatment.   Patient Verbal Consent Given: Yes Education Handout Provided: Previously Provided Muscles Treated: Lt multifidi L3-5  Electrical Stimulation Performed: No Treatment Response/Outcome: Skilled palpation used to identify taut bands and trigger points.  Once identified, dry needling techniques used to treat these areas.  Twitch response ellicited on several levels along with palpable elongation of muscle Lt multifidi.   Addday L1-2 used at Lt thoracic and lumbar paraspinal with good effect - pt reports no soreness at end of this Open books x10, 2x30s holds Cat/cow x10 Pelvic shift hooklying with yoga block 2x30s each   12/30/23: Hooklying yoga block unilaterally with pelvic shift Rt block more of a stretch 3x30s here Windshield wipers x10 Quad yoga block transverse abdominis activation with unilateral lifts 2x10 each 10# single leg Sit to stand 2x10 2x10 single leg heel raises Red loop sidelying full clams 2x10 each Fire hydrants 2x10 red loop  Heel drops - towel rolls for feedback for transverse abdominis  activation 2x10 Opp hand/knee 3s press x10 Standing hamstring stretch 3x30s Standing quad stretch 3x30s   PATIENT EDUCATION:  Education details: [id number] Person educated: Patient Education method: Explanation, Demonstration, Tactile cues, Verbal cues, and Handouts Education comprehension: verbalized understanding, returned demonstration, verbal cues required, tactile cues required, and needs further education  HOME EXERCISE PROGRAM: [email or web address]  ASSESSMENT:  CLINICAL IMPRESSION: Pt has tried some running, 20 min total a couple of times.  She is able to complete without pain and has not had time to be consistent with this due to time constraints. She did well with all exercise in the clinic and required minor verbal cues for breathing upon exertion.  Pt would benefit from additional PT to further address deficits.    OBJECTIVE IMPAIRMENTS: decreased activity tolerance, decreased coordination, decreased endurance, decreased mobility, decreased strength, increased fascial restrictions, increased muscle spasms, impaired flexibility, improper body mechanics, postural dysfunction, and pain.   ACTIVITY LIMITATIONS: lifting, standing, squatting, continence, and locomotion level  PARTICIPATION LIMITATIONS: interpersonal relationship, community activity, and yard work  PERSONAL FACTORS: Fitness, Time since onset of injury/illness/exacerbation, and 1 comorbidity: medical history   are also affecting patient's functional outcome.   REHAB POTENTIAL: Good  CLINICAL DECISION MAKING: Stable/uncomplicated  EVALUATION COMPLEXITY: Low   GOALS: Goals reviewed with patient? Yes  SHORT TERM GOALS: Target date: 01/08/24 Goals updated 01/07/24 Pt to be I with HEP.  Baseline: Goal status: MET  2.  Pt to be I with knack and pressure management with lifting mechanics to decreased strain at pelvic floor.  Baseline:  Goal status: MET  3.  Pt to demonstrate improved coordination of pelvic floor and  breathing mechanics with 10# squat with appropriate synergistic patterns to decrease pain and leakage at least 50% of the time.    Baseline:  Goal status:MET   LONG TERM GOALS: Target date: 06/12/25 Goals updated 01/07/24 Pt to be I with advanced HEP.  Baseline:  Goal status: on going  2.  Pt to demonstrate improved coordination of pelvic floor and breathing mechanics with 50# squat with appropriate synergistic patterns to decrease pain and leakage at least 75% of the time.    Baseline:  Goal status: on going  3.  Pt will be able to run/workout for 30 minutes without leakage or discomfort  Baseline: 20 min (01/19/24) Goal status: on going  4.  Pt to demonstrate no compensatory strategies for x10 single leg squats 10# each leg for improved tolerance to return to running with decreased strain at pelvic floor.  Baseline: compensation on Lt LE (01/19/24) Goal status: on going  5.  Pt to report no more than 2/10  pain with intercourse due to improved mobility and lubrication.  Baseline:  Goal status: on going  6.  Pt to demonstrate improved core activation consistently with exercises to improved DRA separation to maintain moderate tissue density.  Baseline:  Goal status: on going  PLAN:  PT FREQUENCY: 1x/week  PT DURATION:  12 sessions  PLANNED INTERVENTIONS: 97110-Therapeutic exercises, 97530- Therapeutic activity, 97112- Neuromuscular re-education, 97535- Self Care, 16109- Manual therapy, Patient/Family education, Taping, Dry Needling, Joint mobilization, Spinal mobilization, Scar mobilization, DME instructions, Cryotherapy, Moist heat, and Biofeedback  PLAN FOR NEXT SESSION:  continue with transverse abdominis activation, glute and core strength, DRA based exercises (opp glute and lat motions), exhale with all push/pull/lifts  Luella Sager, PT 01/19/24 10:23 AM  [facility] [address] [place], [place] [address] Phone # [phone] Fax  [phone]

## 2024-01-26 ENCOUNTER — Ambulatory Visit

## 2024-01-26 DIAGNOSIS — R252 Cramp and spasm: Secondary | ICD-10-CM

## 2024-01-26 DIAGNOSIS — R293 Abnormal posture: Secondary | ICD-10-CM

## 2024-01-26 DIAGNOSIS — M6281 Muscle weakness (generalized): Secondary | ICD-10-CM | POA: Diagnosis not present

## 2024-01-26 DIAGNOSIS — R279 Unspecified lack of coordination: Secondary | ICD-10-CM

## 2024-01-26 NOTE — Therapy (Signed)
 OUTPATIENT PHYSICAL THERAPY FEMALE PELVIC TREATMENT   Patient Name: Brooke Sherman MRN: [id number] DOB:June 23, 1986, 38 y.o., female Today's Date: 01/26/2024  END OF SESSION:  PT End of Session - 01/26/24 1059     Visit Number 7    Date for PT Re-Evaluation 06/12/24    Authorization Type cone    PT Start Time 1016    PT Stop Time 1059    PT Time Calculation (min) 43 min    Activity Tolerance Patient tolerated treatment well    Behavior During Therapy [facility] for tasks assessed/performed                Past Medical History:  Diagnosis Date   Carpal tunnel syndrome    Hypothyroid    Raynaud's disease    Past Surgical History:  Procedure Laterality Date   ANTERIOR CRUCIATE LIGAMENT REPAIR Left 2019   WISDOM TOOTH EXTRACTION     Patient Active Problem List   Diagnosis Date Noted   Elevated LFTs 08/27/2023   Hypercalcemia 08/27/2023   Acquired hypothyroidism 08/27/2023    PCP: Arcadio Knuckles, MD   REFERRING PROVIDER: Izell Marsh, MD  REFERRING DIAG: O70.9 (ICD-10-CM) - Perineal laceration during delivery, delivered Z39.2 (ICD-10-CM) - Postpartum exam  THERAPY DIAG:  Muscle weakness (generalized)  Abnormal posture  Unspecified lack of coordination  Cramp and spasm  Rationale for Evaluation and Treatment: Rehabilitation  ONSET DATE: 6 months postpartum  SUBJECTIVE:                                                                                                                                                                                           SUBJECTIVE STATEMENT: I have ran a couple of times and keeping it under 30 min, about 2.5 minutes.  No pelvic or low back issues, just leg fatigue.  My low back is feeling better.    Fluid intake: 80-100oz (breast feeding)  PAIN:  Are you having pain? Yes NPRS scale: 0/10- just stiffness  Pain location:  low back   Pain type: aching, soreness Pain description: dull   Aggravating  factors: after working out, or long day with kids alone Relieving factors: rest/ stretching  PRECAUTIONS: Other: postpartum   RED FLAGS: None   WEIGHT BEARING RESTRICTIONS: No  FALLS:  Has patient fallen in last 6 months? No    ACTIVITY LEVEL : moderate   PLOF: Independent  PATIENT GOALS: to be stronger and be able to have strength for a 3rd baby   PERTINENT HISTORY:  Asthma, Carpal tunnel syndrome, Hypothyroid, and Raynaud's disease, ACL repair 2019, x2 vaginal births with 2nd deg tearing  Sexual abuse: No  BOWEL MOVEMENT: Pain with bowel movement: No Denies concerns   URINATION: Pain with urination: No Fully empty bladder: Yes:   Stream: Strong Urgency: No Frequency: right at 2 hours Leakage:  possibly with a very strong cough/sneeze, doesn't think so with running Pads: No  INTERCOURSE:  Ability to have vaginal penetration Yes  Pain with intercourse: Initial Penetration DrynessYes  Climax: not painful Marinoff Scale: 0/3 Laxative:  PREGNANCY: Vaginal deliveries 2 Tearing Yes: grade 2 with first and second babies (sulcal and Rt labia second baby) Episiotomy No C-section deliveries 0 Currently pregnant No  PROLAPSE: Pressure Vaginally   OBJECTIVE:  Note: Objective measures were completed at Evaluation unless otherwise noted.  DIAGNOSTIC FINDINGS:    COGNITION: Overall cognitive status: Within functional limits for tasks assessed     SENSATION: Light touch: Appears intact  LUMBAR SPECIAL TESTS:  SI Compression/distraction test: Negative and Thomas test: Negative Hip drop bil with single leg stance  FUNCTIONAL TESTS:  Functional squat - good mechanics but decreased descent by 25% and slight breath holding  GAIT: WFL  POSTURE: rounded shoulders, forward head, and anterior pelvic tilt   LUMBARAROM/PROM:  A/PROM A/PROM  eval  Flexion WFL  Extension WFL  Right lateral flexion WFL  Left lateral flexion WFL  Right rotation Limited by  25%  Left rotation Limited by 25%   (Blank rows = not tested)  LOWER EXTREMITY ROM:  Bil  hip flexors and hamstrings and adductors and IR/ER limited by 25%  LOWER EXTREMITY MMT:  Bil hips grossly 4+/5; knees 5/5 PALPATION:   General: tightness noted in bil thoracic and lumbar paraspinals   Pelvic Alignment: WFL  Abdominal: [facility]                External Perineal Exam: mild TTP at Rt labia (a previous tear site)                             Internal Pelvic Floor: mild TTP with tightness at Rt superficial pelvic floor   Patient confirms identification and approves PT to assess internal pelvic floor and treatment Yes No emotional/communication barriers or cognitive limitation. Patient is motivated to learn. Patient understands and agrees with treatment goals and plan. PT explains patient will be examined in standing, sitting, and lying down to see how their muscles and joints work. When they are ready, they will be asked to remove their underwear so PT can examine their perineum. The patient is also given the option of providing their own chaperone as one is not provided in our facility. The patient also has the right and is explained the right to defer or refuse any part of the evaluation or treatment including the internal exam. With the patient's consent, PT will use one gloved finger to gently assess the muscles of the pelvic floor, seeing how well it contracts and relaxes and if there is muscle symmetry. After, the patient will get dressed and PT and patient will discuss exam findings and plan of care. PT and patient discuss plan of care, schedule, attendance policy and HEP activities.  PELVIC MMT:   MMT eval 01/07/24  Vaginal 2/5>3/5 with reps 7s, 6 reps   Internal Anal Sphincter    External Anal Sphincter    Puborectalis    Diastasis Recti 1.5 finger width for 2 inches above umbilicus, 1 finger width below for 1 in. Poor density noted  1 finger separation above and below,  does still have  ~1st knuckle depth of separation but improving   (Blank rows = not tested)        TONE: WFL  PROLAPSE: Possible grade 1 anterior vaginal wall laxity with cough in hooklying   TODAY'S TREATMENT:                                                                                                                              DATE:    01/26/24: NuStep: Level 5x 6 minutes-PT present to discuss progress  Seated hamstring stretch: 2x20 seconds  Hooklying yoga block unilaterally with pelvic shift Rt block more of a stretch 3x30s here Red loop sidelying full clams 2x10 each Reverse clam: red band 2x10 bil each Open book with foam roll x10 each Fire hydrants 2x10 red loop  Windshield wipers x10 10# single leg Sit to stand 2x10 Trigger Point Dry Needling Subsequent Treatment: Instructions provided previously at initial dry needling treatment.   Patient Verbal Consent Given: Yes Education Handout Provided: Previously Provided Muscles Treated: Lt multifidi, bil gluteals  Electrical Stimulation Performed: No Treatment Response/Outcome: Skilled palpation used to identify taut bands and trigger points.  Once identified, dry needling techniques used to treat these areas.  Twitch and improved tissue mobility.   01/19/24: NuStep: Level 5x 6 minutes-PT present to discuss progress  Seated hamstring stretch: 2x20 seconds  Seated figure 4 2x20 seconds bil  Sumo squat with 10# 2x10  10# single leg Sit to stand 2x10  01/07/24: Trigger Point Dry Needling Subsequent Treatment: Instructions provided previously at initial dry needling treatment.   Patient Verbal Consent Given: Yes Education Handout Provided: Previously Provided Muscles Treated: Lt multifidi L3-5  Electrical Stimulation Performed: No Treatment Response/Outcome: Skilled palpation used to identify taut bands and trigger points.  Once identified, dry needling techniques used to treat these areas.  Twitch response ellicited on several  levels along with palpable elongation of muscle Lt multifidi.   Addday L1-2 used at Lt thoracic and lumbar paraspinal with good effect - pt reports no soreness at end of this Open books x10, 2x30s holds Cat/cow x10 Pelvic shift hooklying with yoga block 2x30s each   12/30/23: Hooklying yoga block unilaterally with pelvic shift Rt block more of a stretch 3x30s here Windshield wipers x10 Quad yoga block transverse abdominis activation with unilateral lifts 2x10 each 10# single leg Sit to stand 2x10 2x10 single leg heel raises Red loop sidelying full clams 2x10 each Fire hydrants 2x10 red loop  Heel drops - towel rolls for feedback for transverse abdominis activation 2x10 Opp hand/knee 3s press x10 Standing hamstring stretch 3x30s Standing quad stretch 3x30s   PATIENT EDUCATION:  Education details: [email or web address] Person educated: Patient Education method: Explanation, Demonstration, Tactile cues, Verbal cues, and Handouts Education comprehension: verbalized understanding, returned demonstration, verbal cues required, tactile cues required, and needs further education  HOME EXERCISE PROGRAM: [email or web address]  ASSESSMENT:  CLINICAL IMPRESSION: Pt has been able to run 2.5 miles without  increased LBP or pelvic pain with this.  Pt with good response to dry needling with improved tissue mobility and twitch response in all areas treated.  Worked on timing of breath and contraction with squats with 10# weight today. She is working to increase her activity with running and exercise. Pt would benefit from additional PT to further address deficits.    OBJECTIVE IMPAIRMENTS: decreased activity tolerance, decreased coordination, decreased endurance, decreased mobility, decreased strength, increased fascial restrictions, increased muscle spasms, impaired flexibility, improper body mechanics, postural dysfunction, and pain.   ACTIVITY LIMITATIONS: lifting, standing, squatting, continence, and locomotion  level  PARTICIPATION LIMITATIONS: interpersonal relationship, community activity, and yard work  PERSONAL FACTORS: Fitness, Time since onset of injury/illness/exacerbation, and 1 comorbidity: medical history   are also affecting patient's functional outcome.   REHAB POTENTIAL: Good  CLINICAL DECISION MAKING: Stable/uncomplicated  EVALUATION COMPLEXITY: Low   GOALS: Goals reviewed with patient? Yes  SHORT TERM GOALS: Target date: 01/08/24 Goals updated 01/07/24 Pt to be I with HEP.  Baseline: Goal status: MET  2.  Pt to be I with knack and pressure management with lifting mechanics to decreased strain at pelvic floor.  Baseline:  Goal status: MET  3.  Pt to demonstrate improved coordination of pelvic floor and breathing mechanics with 10# squat with appropriate synergistic patterns to decrease pain and leakage at least 50% of the time.    Baseline:  Goal status:MET   LONG TERM GOALS: Target date: 06/12/25 Goals updated 01/07/24 Pt to be I with advanced HEP.  Baseline:  Goal status: on going  2.  Pt to demonstrate improved coordination of pelvic floor and breathing mechanics with 50# squat with appropriate synergistic patterns to decrease pain and leakage at least 75% of the time.    Baseline:  Goal status: on going  3.  Pt will be able to run/workout for 30 minutes without leakage or discomfort  Baseline: 20 min (01/19/24) Goal status: on going  4.  Pt to demonstrate no compensatory strategies for x10 single leg squats 10# each leg for improved tolerance to return to running with decreased strain at pelvic floor.  Baseline: compensation on Lt LE (01/19/24) Goal status: on going  5.  Pt to report no more than 2/10  pain with intercourse due to improved mobility and lubrication.  Baseline:  Goal status: on going  6.  Pt to demonstrate improved core activation consistently with exercises to improved DRA separation to maintain moderate tissue density.  Baseline:  Goal  status: on going  PLAN:  PT FREQUENCY: 1x/week  PT DURATION:  12 sessions  PLANNED INTERVENTIONS: 97110-Therapeutic exercises, 97530- Therapeutic activity, 97112- Neuromuscular re-education, 97535- Self Care, 16109- Manual therapy, Patient/Family education, Taping, Dry Needling, Joint mobilization, Spinal mobilization, Scar mobilization, DME instructions, Cryotherapy, Moist heat, and Biofeedback  PLAN FOR NEXT SESSION:  continue with transverse abdominis activation, glute and core strength, DRA based exercises (opp glute and lat motions), exhale with all push/pull/lifts. Dry needling again if helpful  Luella Sager, PT 01/26/24 11:24 AM  [facility] [address] [place], [place] [address] Phone # [phone] Fax [phone]

## 2024-01-29 ENCOUNTER — Encounter: Payer: Self-pay | Admitting: Physical Medicine & Rehabilitation

## 2024-01-29 ENCOUNTER — Encounter: Payer: Self-pay | Admitting: Obstetrics and Gynecology

## 2024-01-29 ENCOUNTER — Ambulatory Visit: Admitting: Obstetrics and Gynecology

## 2024-01-29 ENCOUNTER — Encounter: Attending: Physical Medicine & Rehabilitation | Admitting: Physical Medicine & Rehabilitation

## 2024-01-29 VITALS — BP 114/66 | HR 56 | Ht 65.0 in | Wt 160.0 lb

## 2024-01-29 VITALS — BP 114/60 | HR 62 | Ht 65.0 in | Wt 158.6 lb

## 2024-01-29 DIAGNOSIS — G5601 Carpal tunnel syndrome, right upper limb: Secondary | ICD-10-CM | POA: Diagnosis not present

## 2024-01-29 DIAGNOSIS — Z3169 Encounter for other general counseling and advice on procreation: Secondary | ICD-10-CM

## 2024-01-29 MED ORDER — BETAMETHASONE SOD PHOS & ACET 6 (3-3) MG/ML IJ SUSP
3.0000 mg | Freq: Once | INTRAMUSCULAR | Status: AC
Start: 2024-01-29 — End: 2024-01-29
  Administered 2024-01-29: 3 mg via INTRAMUSCULAR

## 2024-01-29 MED ORDER — LIDOCAINE HCL 1 % IJ SOLN
1.0000 mL | Freq: Once | INTRAMUSCULAR | Status: AC
  Administered 2024-01-29: 1 mL

## 2024-01-29 NOTE — Progress Notes (Signed)
 Subjective:    Patient ID: Brooke Sherman, female    DOB: May 26, 1986, 38 y.o.   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  She has undergone both the left and right median nerve injections at the carpal tunnel.  The patient has had very good results with these injections 3 to 4 months relief.  She is approximately 8 months postpartum with her second child.  She has been having increasing pain in the right hand and this wakes her up every night, she has to shake out her hand.  This is affecting her sleep.  She has not been dropping objects during the day. She has no new symptoms such as neck pain.  With fingers affected include thumb index and middle fingers. The patient has additional risk factors of hypothyroidism. Pain Inventory Average Pain 9 Pain Right Now 0 My pain is intermittent, tingling, and aching  In the last 24 hours, has pain interfered with the following? General activity 0 Relation with others 0 Enjoyment of life 0 What TIME of day is your pain at its worst? evening Sleep (in general) Fair  Pain is worse with: some activites and using hand Pain improves with: injections and hand in an downward position  Relief from Meds:  80-90% relief with injections  Family History  Problem Relation Age of Onset   Cervical cancer Mother    Healthy Son    Healthy Daughter    Social History   Socioeconomic History   Marital status: Married    Spouse name: Arlyce Lambert   Number of children: Not on file   Years of education: Not on file   Highest education level: Bachelor's degree (e.g., BA, AB, BS)  Occupational History   Not on file  Tobacco Use   Smoking status: Never    Passive exposure: Never   Smokeless tobacco: Never  Vaping Use   Vaping status: Never Used  Substance and Sexual Activity   Alcohol use: Yes    Comment: occ   Drug use: Not Currently    Sexual activity: Yes    Partners: Male    Birth control/protection: None  Other Topics Concern   Not on file  Social History Narrative   Not on file   Social Drivers of Health   Financial Resource Strain: Low Risk  (08/20/2023)   Overall Financial Resource Strain (CARDIA)    Difficulty of Paying Living Expenses: Not very hard  Food Insecurity: No Food Insecurity (08/20/2023)   Hunger Vital Sign    Worried About Running Out of Food in the Last Year: Never true    Ran Out of Food in the Last Year: Never true  Transportation Needs: No Transportation Needs (08/20/2023)   PRAPARE - Administrator, Civil Service (Medical): No    Lack of Transportation (Non-Medical): No  Physical Activity: Sufficiently Active (08/20/2023)   Exercise Vital Sign    Days of Exercise per Week: 5 days    Minutes of Exercise per Session: 30 min  Stress: No Stress Concern Present (08/20/2023)   [employer] of Occupational Health - Occupational Stress Questionnaire    Feeling of Stress : Not at all  Social Connections: Moderately Integrated (08/20/2023)   Social Connection and Isolation Panel [NHANES]    Frequency of Communication with Friends and Family: Three times a week    Frequency of Social Gatherings with Friends and Family: Twice a  week    Attends Religious Services: 1 to 4 times per year    Active Member of Clubs or Organizations: No    Attends Engineer, structural: Not on file    Marital Status: Married   Past Surgical History:  Procedure Laterality Date   ANTERIOR CRUCIATE LIGAMENT REPAIR Left 2019   WISDOM TOOTH EXTRACTION     Past Surgical History:  Procedure Laterality Date   ANTERIOR CRUCIATE LIGAMENT REPAIR Left 2019   WISDOM TOOTH EXTRACTION     Past Medical History:  Diagnosis Date   Carpal tunnel syndrome    Hypothyroid    Raynaud's disease    BP 114/66   Pulse (!) 56   Ht 5\' 5"  (1.651 m)   Wt 160 lb (72.6 kg)   SpO2 96%   Breastfeeding Yes    BMI 26.63 kg/m   Opioid Risk Score:   Fall Risk Score:  `1  Depression screen PHQ 2/9     01/29/2024   12:00 PM 04/30/2023    1:42 PM 02/28/2023    8:18 AM 11/12/2022    9:51 AM 10/08/2022    8:40 AM 07/24/2022    1:18 PM 10/18/2021   11:35 AM  Depression screen PHQ 2/9  Decreased Interest 0 0 0 0 0 0 0  Down, Depressed, Hopeless 0 0 0 0 0 0 0  PHQ - 2 Score 0 0 0 0 0 0 0  Altered sleeping  0 0 0 0 0   Tired, decreased energy  0 0 0 0 0   Change in appetite  0 0 0 0 0   Feeling bad or failure about yourself   0 0 0 0 0   Trouble concentrating  0 0 0 0 0   Moving slowly or fidgety/restless  0 0 0 0 0   Suicidal thoughts  0 0 0 0 0   PHQ-9 Score  0 0 0 0 0   Difficult doing work/chores  Not difficult at all Not difficult at all Not difficult at all       Review of Systems  Musculoskeletal:        Pain in both hands & wrist  Neurological:  Positive for numbness.  All other systems reviewed and are negative.      Objective:   Physical Exam Bilateral hands show no evidence of intrinsic atrophy There is normal sensation light touch bilaterally There is reduced pinprick sensation in the right index and middle fingers compared to the left side as well as compared to the right fifth digit. Negative Tinel's bilaterally  No pain with cervical spine range of motion Motor strength is 5/5 bilateral deltoid bicep tricep grip      Assessment & Plan:   Median nerve focal compression of the wrist causing carpal tunnel syndrome.  She has more symptomatology on the right side.  She has good relief with carpal tunnel injections in the past 3 to 4 months.  Because of her childcare responsibilities with her 2 young children she does not wish to consider surgery at this time.   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 Symptoms are not relieved by conservative  care.  Informed consent was obtained after describing risks and benefits of the procedure with the patient. These include bleeding bruising and infection as well as nerve injury. Patient elected to proceed and has given written  consent. The right distal wrist crease was marked and prepped with Betadine. A 30-gauge 1/2 inch needle was inserted and 0.25 ML's of 1% lidocaine  injected into the skin and subcutaneous tissue. Then a 27-gauge 1/2 inch needle was inserted into the carpal tunnel and 0.25 mL of celestone  6mg  per mL was injected.   Patient tolerated procedure well. Post procedure instructions given.  Patient to call if she needs repeat injection.  We discussed that injections need to be at minimum 3 months apart.

## 2024-01-29 NOTE — Progress Notes (Signed)
 Pt planning to conceive around 04/2024. When baby turns 1. No periods since delivery. Breastfeeding, no BC

## 2024-01-29 NOTE — Progress Notes (Signed)
   RETURN GYNECOLOGY VISIT  Subjective:  Brooke Sherman is a 38 y.o. [id number] presenting for preconception counseling  Delivered her second child 05/22/23. Delivery was complicated by a labial & sulcal laceration that then had a mild cellulitis we treated with antibiotics. Had some persistent pain so returned to PFPT which has been helpful.  Her history is notable for hypothyroidism for which she takes synthroid   Considering pregnancy at 1 year mark. Exclusively breast feeding, period has not come back. Is wondering if it is safe to try to conceive in the near future and if it is safe to breastfeed while pregnant.  Objective:   Vitals:   01/29/24 0948  BP: 114/60  Pulse: 62  Weight: 158 lb 9.6 oz (71.9 kg)  Height: 5\' 5"  (1.651 m)    General:  Alert, oriented and cooperative. Patient is in no acute distress.  Skin: Skin is warm and dry. No rash noted.   Cardiovascular: Normal heart rate noted  Respiratory: Normal respiratory effort, no problems with respiration noted    Assessment and Plan:  Brooke Sherman is a 38 y.o. presenting for preconception counseling  Preconception counseling - Discussed birth spacing to reduce risk of preterm birth. Discussed optimal spacing is considered 18+ months between the end of pregnancy and the start of the next, but that it would still be reasonable to start trying to conceive again at the 1 year mark. She otherwise does not have risk factors for preterm birth and has had two healthy pregnancies deliver at 72 & 41 weeks - She has not resumed menses since delivery and notes it took ~ 1 year with her last child to start getting a period again. Discussed it is possible to get pregnant without getting a period if she ovulates and is having unprotected IC - Discussed strategies to try to predict ovulation including OPKs, basal body temp, and cervical mucus but these might be arduous until her cycles restart - Breastfeeding  throughout pregnancy is considered safe especially in a low risk pregnancy - Continue PNV - Up to date on pap   Total encounter time: 15 minutes  Future Appointments  Date Time Provider Department Center  02/02/2024 10:15 AM Marissa Sida, PT [facility] None  02/09/2024  9:30 AM Lang Pipes, PT [facility] None  02/24/2024 10:00 AM Arcadio Knuckles, MD [facility] None  11/24/2024  8:00 AM Nicholas Bari, MD [facility] None   Izell Marsh, MD

## 2024-02-02 ENCOUNTER — Ambulatory Visit: Attending: Obstetrics and Gynecology

## 2024-02-02 DIAGNOSIS — M6281 Muscle weakness (generalized): Secondary | ICD-10-CM | POA: Diagnosis not present

## 2024-02-02 DIAGNOSIS — R252 Cramp and spasm: Secondary | ICD-10-CM | POA: Insufficient documentation

## 2024-02-02 DIAGNOSIS — R279 Unspecified lack of coordination: Secondary | ICD-10-CM | POA: Diagnosis not present

## 2024-02-02 DIAGNOSIS — R262 Difficulty in walking, not elsewhere classified: Secondary | ICD-10-CM | POA: Diagnosis not present

## 2024-02-02 DIAGNOSIS — R293 Abnormal posture: Secondary | ICD-10-CM | POA: Insufficient documentation

## 2024-02-02 NOTE — Therapy (Signed)
 OUTPATIENT PHYSICAL THERAPY FEMALE PELVIC TREATMENT   Patient Name: Brooke Sherman MRN: [id number] DOB:[birth year], 38 y.o., female Today's Date: 02/02/2024  END OF SESSION:  PT End of Session - 02/02/24 1107     Visit Number 8    Date for PT Re-Evaluation 06/12/24    Authorization Type cone    PT Start Time 1017    PT Stop Time 1102    PT Time Calculation (min) 45 min    Activity Tolerance Patient tolerated treatment well    Behavior During Therapy [facility] for tasks assessed/performed                 Past Medical History:  Diagnosis Date   Carpal tunnel syndrome    Hypothyroid    Raynaud's disease    Past Surgical History:  Procedure Laterality Date   ANTERIOR CRUCIATE LIGAMENT REPAIR Left 2019   WISDOM TOOTH EXTRACTION     Patient Active Problem List   Diagnosis Date Noted   Elevated LFTs 08/27/2023   Hypercalcemia 08/27/2023   Acquired hypothyroidism 08/27/2023    PCP: Arcadio Knuckles, MD   REFERRING PROVIDER: Izell Marsh, MD  REFERRING DIAG: O70.9 (ICD-10-CM) - Perineal laceration during delivery, delivered Z39.2 (ICD-10-CM) - Postpartum exam  THERAPY DIAG:  Muscle weakness (generalized)  Abnormal posture  Unspecified lack of coordination  Cramp and spasm  Rationale for Evaluation and Treatment: Rehabilitation  ONSET DATE: 6 months postpartum  SUBJECTIVE:                                                                                                                                                                                           SUBJECTIVE STATEMENT: Rt lateral leg tightness with running.  I am going to try a reformer class with my friend.    Fluid intake: 80-100oz (breast feeding)  PAIN:  Are you having pain? Yes NPRS scale: 0/10- just stiffness  Pain location:  low back   Pain type: aching, soreness Pain description: dull   Aggravating factors: after working out, or long day with kids alone Relieving  factors: rest/ stretching  PRECAUTIONS: Other: postpartum   RED FLAGS: None   WEIGHT BEARING RESTRICTIONS: No  FALLS:  Has patient fallen in last 6 months? No    ACTIVITY LEVEL : moderate   PLOF: Independent  PATIENT GOALS: to be stronger and be able to have strength for a 3rd baby   PERTINENT HISTORY:  Asthma, Carpal tunnel syndrome, Hypothyroid, and Raynaud's disease, ACL repair 2019, x2 vaginal births with 2nd deg tearing  Sexual abuse: No  BOWEL MOVEMENT: Pain with bowel movement: No Denies concerns  URINATION: Pain with urination: No Fully empty bladder: Yes:   Stream: Strong Urgency: No Frequency: right at 2 hours Leakage:  possibly with a very strong cough/sneeze, doesn't think so with running Pads: No  INTERCOURSE:  Ability to have vaginal penetration Yes  Pain with intercourse: Initial Penetration DrynessYes  Climax: not painful Marinoff Scale: 0/3 Laxative:  PREGNANCY: Vaginal deliveries 2 Tearing Yes: grade 2 with first and second babies (sulcal and Rt labia second baby) Episiotomy No C-section deliveries 0 Currently pregnant No  PROLAPSE: Pressure Vaginally   OBJECTIVE:  Note: Objective measures were completed at Evaluation unless otherwise noted.  DIAGNOSTIC FINDINGS:    COGNITION: Overall cognitive status: Within functional limits for tasks assessed     SENSATION: Light touch: Appears intact  LUMBAR SPECIAL TESTS:  SI Compression/distraction test: Negative and Thomas test: Negative Hip drop bil with single leg stance  FUNCTIONAL TESTS:  Functional squat - good mechanics but decreased descent by 25% and slight breath holding  GAIT: WFL  POSTURE: rounded shoulders, forward head, and anterior pelvic tilt   LUMBARAROM/PROM:  A/PROM A/PROM  eval  Flexion WFL  Extension WFL  Right lateral flexion WFL  Left lateral flexion WFL  Right rotation Limited by 25%  Left rotation Limited by 25%   (Blank rows = not  tested)  LOWER EXTREMITY ROM:  Bil  hip flexors and hamstrings and adductors and IR/ER limited by 25%  LOWER EXTREMITY MMT:  Bil hips grossly 4+/5; knees 5/5 PALPATION:   General: tightness noted in bil thoracic and lumbar paraspinals   Pelvic Alignment: WFL  Abdominal: [facility]                External Perineal Exam: mild TTP at Rt labia (a previous tear site)                             Internal Pelvic Floor: mild TTP with tightness at Rt superficial pelvic floor   Patient confirms identification and approves PT to assess internal pelvic floor and treatment Yes No emotional/communication barriers or cognitive limitation. Patient is motivated to learn. Patient understands and agrees with treatment goals and plan. PT explains patient will be examined in standing, sitting, and lying down to see how their muscles and joints work. When they are ready, they will be asked to remove their underwear so PT can examine their perineum. The patient is also given the option of providing their own chaperone as one is not provided in our facility. The patient also has the right and is explained the right to defer or refuse any part of the evaluation or treatment including the internal exam. With the patient's consent, PT will use one gloved finger to gently assess the muscles of the pelvic floor, seeing how well it contracts and relaxes and if there is muscle symmetry. After, the patient will get dressed and PT and patient will discuss exam findings and plan of care. PT and patient discuss plan of care, schedule, attendance policy and HEP activities.  PELVIC MMT:   MMT eval 01/07/24  Vaginal 2/5>3/5 with reps 7s, 6 reps   Internal Anal Sphincter    External Anal Sphincter    Puborectalis    Diastasis Recti 1.5 finger width for 2 inches above umbilicus, 1 finger width below for 1 in. Poor density noted  1 finger separation above and below, does still have ~1st knuckle depth of separation but improving    (Blank rows =  not tested)        TONE: WFL  PROLAPSE: Possible grade 1 anterior vaginal wall laxity with cough in hooklying   TODAY'S TREATMENT:                                                                                                                              DATE:    02/02/24: Elliptical: Level 5, ramp 5x 6 minutes-PT present to discuss progress  Supine hamstring stretch with adduction focus for ITB 3x20 seconds  Seated figure 4 stretch: 2x20seconds  Goblet squat with 10# kettlebell with timing of breath 2x10 Fire hydrants 2x10 red loop  Windshield wipers x10 10# single leg Sit to stand 2x10 bil Trigger Point Dry Needling Subsequent Treatment: Instructions provided previously at initial dry needling treatment.  Patient Verbal Consent Given: Yes Education Handout Provided: Previously Provided Muscles Treated: bil multifidi, bil gluteals  Electrical Stimulation Performed: No Treatment Response/Outcome: Skilled palpation used to identify taut bands and trigger points.  Once identified, dry needling techniques used to treat these areas.  Twitch and improved tissue mobility.   01/26/24: NuStep: Level 5x 6 minutes-PT present to discuss progress  Seated hamstring stretch: 2x20 seconds  Red loop sidelying full clams 2x10 each Reverse clam: red band 2x10 bil each Open book with foam roll x10 each Fire hydrants 2x10 red loop  Windshield wipers x10 10# single leg Sit to stand 2x10 Trigger Point Dry Needling Subsequent Treatment: Instructions provided previously at initial dry needling treatment.   Patient Verbal Consent Given: Yes Education Handout Provided: Previously Provided Muscles Treated: Lt multifidi, bil gluteals  Electrical Stimulation Performed: No Treatment Response/Outcome: Skilled palpation used to identify taut bands and trigger points.  Once identified, dry needling techniques used to treat these areas.  Twitch and improved tissue mobility.    01/19/24: NuStep: Level 5x 6 minutes-PT present to discuss progress  Seated hamstring stretch: 2x20 seconds  Seated figure 4 2x20 seconds bil  Sumo squat with 10# 2x10  10# single leg Sit to stand 2x10  01/07/24: Trigger Point Dry Needling Subsequent Treatment: Instructions provided previously at initial dry needling treatment.   Patient Verbal Consent Given: Yes Education Handout Provided: Previously Provided Muscles Treated: Lt multifidi L3-5  Electrical Stimulation Performed: No Treatment Response/Outcome: Skilled palpation used to identify taut bands and trigger points.  Once identified, dry needling techniques used to treat these areas.  Twitch response ellicited on several levels along with palpable elongation of muscle Lt multifidi.   Addday L1-2 used at Lt thoracic and lumbar paraspinal with good effect - pt reports no soreness at end of this Open books x10, 2x30s holds Cat/cow x10 Pelvic shift hooklying with yoga block 2x30s each    PATIENT EDUCATION:  Education details: [id number] Person educated: Patient Education method: Explanation, Demonstration, Tactile cues, Verbal cues, and Handouts Education comprehension: verbalized understanding, returned demonstration, verbal cues required, tactile cues required, and needs further education  HOME EXERCISE PROGRAM: [id number]  ASSESSMENT:  CLINICAL IMPRESSION: Pt continues to be able to run 2.5 miles without increased LBP or pelvic pain with this.  She has noticed Rt ITB tightness with running and has focued on stretching this more frequently.  She plans to take a reformer class with a friend and PT discussed form and to modify if needed. Improved stability with single leg sit to stand.  Pt with good response to dry needling with improved tissue mobility and twitch response in all areas treated.  Worked on timing of breath and contraction with squats with 10# weight today. She is working to increase her activity with running  and exercise. Pt would benefit from additional PT to further address deficits.    OBJECTIVE IMPAIRMENTS: decreased activity tolerance, decreased coordination, decreased endurance, decreased mobility, decreased strength, increased fascial restrictions, increased muscle spasms, impaired flexibility, improper body mechanics, postural dysfunction, and pain.   ACTIVITY LIMITATIONS: lifting, standing, squatting, continence, and locomotion level  PARTICIPATION LIMITATIONS: interpersonal relationship, community activity, and yard work  PERSONAL FACTORS: Fitness, Time since onset of injury/illness/exacerbation, and 1 comorbidity: medical history   are also affecting patient's functional outcome.   REHAB POTENTIAL: Good  CLINICAL DECISION MAKING: Stable/uncomplicated  EVALUATION COMPLEXITY: Low   GOALS: Goals reviewed with patient? Yes  SHORT TERM GOALS: Target date: 01/08/24 Goals updated 01/07/24 Pt to be I with HEP.  Baseline: Goal status: MET  2.  Pt to be I with knack and pressure management with lifting mechanics to decreased strain at pelvic floor.  Baseline:  Goal status: MET  3.  Pt to demonstrate improved coordination of pelvic floor and breathing mechanics with 10# squat with appropriate synergistic patterns to decrease pain and leakage at least 50% of the time.    Baseline:  Goal status:MET   LONG TERM GOALS: Target date: 06/12/25 Goals updated 01/07/24 Pt to be I with advanced HEP.  Baseline:  Goal status: on going  2.  Pt to demonstrate improved coordination of pelvic floor and breathing mechanics with 50# squat with appropriate synergistic patterns to decrease pain and leakage at least 75% of the time.    Baseline:  Goal status: on going  3.  Pt will be able to run/workout for 30 minutes without leakage or discomfort  Baseline: 20 min (01/19/24) Goal status: on going  4.  Pt to demonstrate no compensatory strategies for x10 single leg squats 10# each leg for improved  tolerance to return to running with decreased strain at pelvic floor.  Baseline: good control and symmetry (02/02/24) Goal status: MET  5.  Pt to report no more than 2/10  pain with intercourse due to improved mobility and lubrication.  Baseline:  Goal status: on going  6.  Pt to demonstrate improved core activation consistently with exercises to improved DRA separation to maintain moderate tissue density.  Baseline:  Goal status: on going  PLAN:  PT FREQUENCY: 1x/week  PT DURATION:  12 sessions  PLANNED INTERVENTIONS: 97110-Therapeutic exercises, 97530- Therapeutic activity, 97112- Neuromuscular re-education, 97535- Self Care, 40981- Manual therapy, Patient/Family education, Taping, Dry Needling, Joint mobilization, Spinal mobilization, Scar mobilization, DME instructions, Cryotherapy, Moist heat, and Biofeedback  PLAN FOR NEXT SESSION:  continue with transverse abdominis activation, glute and core strength, DRA based exercises (opp glute and lat motions), exhale with all push/pull/lifts. Dry needling again if helpful  Luella Sager, PT 02/02/24 11:10 AM  [facility] [address], [address] [place], [place] [address] Phone # [phone] Fax [phone]

## 2024-02-09 ENCOUNTER — Ambulatory Visit: Admitting: Physical Therapy

## 2024-02-16 ENCOUNTER — Ambulatory Visit: Admitting: Physical Therapy

## 2024-02-16 ENCOUNTER — Encounter: Payer: Self-pay | Admitting: Physical Therapy

## 2024-02-16 DIAGNOSIS — R262 Difficulty in walking, not elsewhere classified: Secondary | ICD-10-CM

## 2024-02-16 DIAGNOSIS — M6281 Muscle weakness (generalized): Secondary | ICD-10-CM

## 2024-02-16 DIAGNOSIS — R293 Abnormal posture: Secondary | ICD-10-CM

## 2024-02-16 DIAGNOSIS — R252 Cramp and spasm: Secondary | ICD-10-CM

## 2024-02-16 DIAGNOSIS — R279 Unspecified lack of coordination: Secondary | ICD-10-CM | POA: Diagnosis not present

## 2024-02-16 NOTE — Therapy (Signed)
 OUTPATIENT PHYSICAL THERAPY FEMALE PELVIC TREATMENT   Patient Name: Brooke Sherman MRN: [id number] DOB:[birth year], 38 y.o., female Today's Date: 02/16/2024  END OF SESSION:  PT End of Session - 02/16/24 1707     Visit Number 9    Date for PT Re-Evaluation 06/12/24    Authorization Type cone    PT Start Time 0419    PT Stop Time 0500    PT Time Calculation (min) 41 min    Activity Tolerance Patient tolerated treatment well    Behavior During Therapy [facility] for tasks assessed/performed                  Past Medical History:  Diagnosis Date   Carpal tunnel syndrome    Hypothyroid    Raynaud's disease    Past Surgical History:  Procedure Laterality Date   ANTERIOR CRUCIATE LIGAMENT REPAIR Left 2019   WISDOM TOOTH EXTRACTION     Patient Active Problem List   Diagnosis Date Noted   Elevated LFTs 08/27/2023   Hypercalcemia 08/27/2023   Acquired hypothyroidism 08/27/2023    PCP: Arcadio Knuckles, MD   REFERRING PROVIDER: Izell Marsh, MD  REFERRING DIAG: O70.9 (ICD-10-CM) - Perineal laceration during delivery, delivered Z39.2 (ICD-10-CM) - Postpartum exam  THERAPY DIAG:  Muscle weakness (generalized)  Abnormal posture  Unspecified lack of coordination  Cramp and spasm  Difficulty in walking, not elsewhere classified  Rationale for Evaluation and Treatment: Rehabilitation  ONSET DATE: 6 months postpartum  SUBJECTIVE:                                                                                                                                                                                           SUBJECTIVE STATEMENT: I still have a lot of days where my back feels really tight. I'm not really having pain. I also leaned back on my Peloton the other day and it seemed like I had some coning at the top.    Fluid intake: 80-100oz (breast feeding)  PAIN:  Are you having pain? Yes NPRS scale: 0/10- just stiffness  Pain location: low  back   Pain type: aching, soreness Pain description: dull   Aggravating factors: after working out, or long day with kids alone Relieving factors: rest/ stretching  PRECAUTIONS: Other: postpartum   RED FLAGS: None   WEIGHT BEARING RESTRICTIONS: No  FALLS:  Has patient fallen in last 6 months? No    ACTIVITY LEVEL : moderate   PLOF: Independent  PATIENT GOALS: to be stronger and be able to have strength for a 3rd baby   PERTINENT HISTORY:  Asthma, Carpal tunnel syndrome, Hypothyroid,  and Raynaud's disease, ACL repair 2019, x2 vaginal births with 2nd deg tearing  Sexual abuse: No  BOWEL MOVEMENT: Pain with bowel movement: No Denies concerns   URINATION: Pain with urination: No Fully empty bladder: Yes:   Stream: Strong Urgency: No Frequency: right at 2 hours Leakage: possibly with a very strong cough/sneeze, doesn't think so with running Pads: No  INTERCOURSE:  Ability to have vaginal penetration Yes  Pain with intercourse: Initial Penetration DrynessYes  Climax: not painful Marinoff Scale: 0/3 Laxative:  PREGNANCY: Vaginal deliveries 2 Tearing Yes: grade 2 with first and second babies (sulcal and Rt labia second baby) Episiotomy No C-section deliveries 0 Currently pregnant No  PROLAPSE: Pressure Vaginally   OBJECTIVE:  Note: Objective measures were completed at Evaluation unless otherwise noted.  DIAGNOSTIC FINDINGS:    COGNITION: Overall cognitive status: Within functional limits for tasks assessed     SENSATION: Light touch: Appears intact  LUMBAR SPECIAL TESTS:  SI Compression/distraction test: Negative and Thomas test: Negative Hip drop bil with single leg stance  FUNCTIONAL TESTS:  Functional squat - good mechanics but decreased descent by 25% and slight breath holding  GAIT: WFL  POSTURE: rounded shoulders, forward head, and anterior pelvic tilt   LUMBARAROM/PROM:  A/PROM A/PROM  eval  Flexion WFL  Extension WFL  Right  lateral flexion WFL  Left lateral flexion WFL  Right rotation Limited by 25%  Left rotation Limited by 25%   (Blank rows = not tested)  LOWER EXTREMITY ROM:  Bil  hip flexors and hamstrings and adductors and IR/ER limited by 25%  LOWER EXTREMITY MMT:  Bil hips grossly 4+/5; knees 5/5 PALPATION:   General: tightness noted in bil thoracic and lumbar paraspinals   Pelvic Alignment: WFL  Abdominal: [facility]                External Perineal Exam: mild TTP at Rt labia (a previous tear site)                             Internal Pelvic Floor: mild TTP with tightness at Rt superficial pelvic floor   Patient confirms identification and approves PT to assess internal pelvic floor and treatment Yes No emotional/communication barriers or cognitive limitation. Patient is motivated to learn. Patient understands and agrees with treatment goals and plan. PT explains patient will be examined in standing, sitting, and lying down to see how their muscles and joints work. When they are ready, they will be asked to remove their underwear so PT can examine their perineum. The patient is also given the option of providing their own chaperone as one is not provided in our facility. The patient also has the right and is explained the right to defer or refuse any part of the evaluation or treatment including the internal exam. With the patient's consent, PT will use one gloved finger to gently assess the muscles of the pelvic floor, seeing how well it contracts and relaxes and if there is muscle symmetry. After, the patient will get dressed and PT and patient will discuss exam findings and plan of care. PT and patient discuss plan of care, schedule, attendance policy and HEP activities.  PELVIC MMT:   MMT eval 01/07/24  Vaginal 2/5>3/5 with reps 7s, 6 reps   Internal Anal Sphincter    External Anal Sphincter    Puborectalis    Diastasis Recti 1.5 finger width for 2 inches above umbilicus, 1 finger width below  for 1  in. Poor density noted  1 finger separation above and below, does still have ~1st knuckle depth of separation but improving   (Blank rows = not tested)        TONE: WFL  PROLAPSE: Possible grade 1 anterior vaginal wall laxity with cough in hooklying   TODAY'S TREATMENT:                                                                                                                              DATE:    02/16/24: Elliptical: Level 5, ramp 5x 6 minutes-PT present to discuss progress  Dying bug with 2# weights Quadriped crunch to bird dog x 10 B Bear plank ---> alt hand raise Goblet squat with 10# kettlebell with timing of breath 2x10Fire hydrants 2x10 green band   Manual:  Checked DR - 2 fingers, mild coning mid abs Self care: options for decompression and manual lumbar traction; discussed DR status; psoas release with ball  STM to B lumbar post DN Trigger Point Dry Needling Subsequent Treatment: Instructions provided previously at initial dry needling treatment.  Patient Verbal Consent Given: Yes Education Handout Provided: Previously Provided Muscles Treated: bil multifidi Electrical Stimulation Performed: No Treatment Response/Outcome: Skilled palpation used to identify taut bands and trigger points.  Once identified, dry needling techniques used to treat these areas.  Twitch and improved tissue mobility.   02/02/24: Elliptical: Level 5, ramp 5x 6 minutes-PT present to discuss progress  Supine hamstring stretch with adduction focus for ITB 3x20 seconds  Seated figure 4 stretch: 2x20seconds  Goblet squat with 10# kettlebell with timing of breath 2x10 Fire hydrants 2x10 red loop  Windshield wipers x10 10# single leg Sit to stand 2x10 bil Trigger Point Dry Needling Subsequent Treatment: Instructions provided previously at initial dry needling treatment.  Patient Verbal Consent Given: Yes Education Handout Provided: Previously Provided Muscles Treated: bil multifidi, bil  gluteals  Electrical Stimulation Performed: No Treatment Response/Outcome: Skilled palpation used to identify taut bands and trigger points.  Once identified, dry needling techniques used to treat these areas.  Twitch and improved tissue mobility.   01/26/24: NuStep: Level 5x 6 minutes-PT present to discuss progress  Seated hamstring stretch: 2x20 seconds  Red loop sidelying full clams 2x10 each Reverse clam: red band 2x10 bil each Open book with foam roll x10 each Fire hydrants 2x10 red loop  Windshield wipers x10 10# single leg Sit to stand 2x10 Trigger Point Dry Needling Subsequent Treatment: Instructions provided previously at initial dry needling treatment.   Patient Verbal Consent Given: Yes Education Handout Provided: Previously Provided Muscles Treated: Lt multifidi, bil gluteals  Electrical Stimulation Performed: No Treatment Response/Outcome: Skilled palpation used to identify taut bands and trigger points.  Once identified, dry needling techniques used to treat these areas.  Twitch and improved tissue mobility.   01/19/24: NuStep: Level 5x 6 minutes-PT present to discuss progress  Seated hamstring stretch: 2x20 seconds  Seated figure 4 2x20  seconds bil  Sumo squat with 10# 2x10  10# single leg Sit to stand 2x10  01/07/24: Trigger Point Dry Needling Subsequent Treatment: Instructions provided previously at initial dry needling treatment.   Patient Verbal Consent Given: Yes Education Handout Provided: Previously Provided Muscles Treated: Lt multifidi L3-5  Electrical Stimulation Performed: No Treatment Response/Outcome: Skilled palpation used to identify taut bands and trigger points.  Once identified, dry needling techniques used to treat these areas.  Twitch response ellicited on several levels along with palpable elongation of muscle Lt multifidi.   Addday L1-2 used at Lt thoracic and lumbar paraspinal with good effect - pt reports no soreness at end of this Open  books x10, 2x30s holds Cat/cow x10 Pelvic shift hooklying with yoga block 2x30s each    PATIENT EDUCATION:  Education details: [id number] Person educated: Patient Education method: Explanation, Demonstration, Tactile cues, Verbal cues, and Handouts Education comprehension: verbalized understanding, returned demonstration, verbal cues required, tactile cues required, and needs further education  HOME EXERCISE PROGRAM: [id number]  ASSESSMENT:  CLINICAL IMPRESSION: Patient presents today reporting that she is running without difficulty now. She still has not run 30 minutes, but is close. She was concerned re: DR which is still measuring 2 fingers and mild coning present as well. Pt demonstrates improving core strength and she was able to progress exercises with weight and difficulty today. She is still having ongoing stiffness. We discussed manual traction and distraction techniques as well as psoas release with ball. She had a marked twitch response today in her R L4/5 multifidi and reported and demonstrated decreased tightness after. Patient is progressing appropriately. She continues to demonstrate potential for improvement and would benefit from continued skilled therapy to address impairments.    OBJECTIVE IMPAIRMENTS: decreased activity tolerance, decreased coordination, decreased endurance, decreased mobility, decreased strength, increased fascial restrictions, increased muscle spasms, impaired flexibility, improper body mechanics, postural dysfunction, and pain.   ACTIVITY LIMITATIONS: lifting, standing, squatting, continence, and locomotion level  PARTICIPATION LIMITATIONS: interpersonal relationship, community activity, and yard work  PERSONAL FACTORS: Fitness, Time since onset of injury/illness/exacerbation, and 1 comorbidity: medical history  are also affecting patient's functional outcome.   REHAB POTENTIAL: Good  CLINICAL DECISION MAKING: Stable/uncomplicated  EVALUATION  COMPLEXITY: Low   GOALS: Goals reviewed with patient? Yes  SHORT TERM GOALS: Target date: 01/08/24 Goals updated 01/07/24 Pt to be I with HEP.  Baseline: Goal status: MET  2.  Pt to be I with knack and pressure management with lifting mechanics to decreased strain at pelvic floor.  Baseline:  Goal status: MET  3.  Pt to demonstrate improved coordination of pelvic floor and breathing mechanics with 10# squat with appropriate synergistic patterns to decrease pain and leakage at least 50% of the time.    Baseline:  Goal status:MET   LONG TERM GOALS: Target date: 06/12/25 Goals updated 01/07/24 Pt to be I with advanced HEP.  Baseline:  Goal status: on going  2.  Pt to demonstrate improved coordination of pelvic floor and breathing mechanics with 50# squat with appropriate synergistic patterns to decrease pain and leakage at least 75% of the time.    Baseline:  Goal status: on going  3.  Pt will be able to run/workout for 30 minutes without leakage or discomfort  Baseline: 25 min (02/16/24) Goal status: on going  4.  Pt to demonstrate no compensatory strategies for x10 single leg squats 10# each leg for improved tolerance to return to running with decreased strain at pelvic floor.  Baseline:  good control and symmetry (02/02/24) Goal status: MET  5.  Pt to report no more than 2/10  pain with intercourse due to improved mobility and lubrication.  Baseline:  Goal status: on going  6.  Pt to demonstrate improved core activation consistently with exercises to improved DRA separation to maintain moderate tissue density.  Baseline:  Goal status: on going  PLAN:  PT FREQUENCY: 1x/week  PT DURATION: 12 sessions  PLANNED INTERVENTIONS: 97110-Therapeutic exercises, 97530- Therapeutic activity, 97112- Neuromuscular re-education, 97535- Self Care, 16109- Manual therapy, Patient/Family education, Taping, Dry Needling, Joint mobilization, Spinal mobilization, Scar mobilization, DME  instructions, Cryotherapy, Moist heat, and Biofeedback  PLAN FOR NEXT SESSION:  continue with transverse abdominis activation, glute and core strength, DRA based exercises (opp glute and lat motions), exhale with all push/pull/lifts. Dry needling again if helpful  Jinx Mourning, PT  02/16/24 5:14 PM  [facility] [address], [address] [place], [place] [address] Phone # [phone] Fax [phone]

## 2024-02-24 ENCOUNTER — Ambulatory Visit: Payer: 59 | Admitting: Internal Medicine

## 2024-02-24 VITALS — BP 120/88 | HR 50 | Temp 97.9°F | Resp 16 | Ht 65.0 in | Wt 160.4 lb

## 2024-02-24 DIAGNOSIS — E039 Hypothyroidism, unspecified: Secondary | ICD-10-CM | POA: Diagnosis not present

## 2024-02-24 DIAGNOSIS — R001 Bradycardia, unspecified: Secondary | ICD-10-CM

## 2024-02-24 DIAGNOSIS — N912 Amenorrhea, unspecified: Secondary | ICD-10-CM | POA: Diagnosis not present

## 2024-02-24 DIAGNOSIS — Z0000 Encounter for general adult medical examination without abnormal findings: Secondary | ICD-10-CM | POA: Diagnosis not present

## 2024-02-24 DIAGNOSIS — Z0001 Encounter for general adult medical examination with abnormal findings: Secondary | ICD-10-CM | POA: Insufficient documentation

## 2024-02-24 NOTE — Patient Instructions (Signed)

## 2024-02-24 NOTE — Progress Notes (Unsigned)
 Subjective:  Patient ID: Brooke Sherman, female    DOB: [birth year]  Age: 38 y.o. MRN: [id number]  CC: Annual Exam and Hypothyroidism   HPI [name] presents for a CPX and f/up ----  Discussed the use of AI scribe software for clinical note transcription with the patient, who gave verbal consent to proceed.  History of Present Illness   Brooke Sherman is a 38 year old female who presents for a routine check-up and cholesterol screening.   She has a heart rate of fifty, which is typical for her due to her high-intensity workouts. No episodes of dizziness or lightheadedness. She feels her thyroid  medication is adequate and reports feeling generally well, despite some sleep disturbances due to her nine-month-old child. No major fatigue, constipation, or significant weight changes, although she notes some weight retention from pregnancy.  Her last menstrual cycle was prior to her pregnancy, and it has not resumed. She is not using contraception and is open to having another child, though she does not believe she is currently pregnant.  She wants to have her cholesterol checked due to a family history of high cholesterol, including her parents and sister. She has never taken statins and has not had high cholesterol in the past, but is concerned due to her approaching forties and recent dietary changes post-pregnancy.  She continues to experience carpal tunnel syndrome, which she had during nursing her first child. She received an injection in her right wrist two to three weeks ago, which provided significant relief. Her left side still falls asleep, and she plans to have an injection on that side soon.       Outpatient Medications Prior to Visit  Medication Sig Dispense Refill   Prenatal Vit-Fe Fumarate-FA (PRENATAL PO) Take by mouth daily.     VITAMIN D  PO Take 1,000 Units by mouth daily.     levothyroxine  (SYNTHROID ) 50 MCG tablet Take 1 tablet (50  mcg total) by mouth daily. 30 tablet 5   No facility-administered medications prior to visit.    ROS Review of Systems  Objective:  BP 120/88 (BP Location: Right Arm, Patient Position: Sitting, Cuff Size: Small)   Pulse (!) 50   Temp 97.9 F (36.6 C) (Oral)   Resp 16   Ht 5\' 5"  (1.651 m)   Wt 160 lb 6.4 oz (72.8 kg)   LMP  (LMP Unknown)   SpO2 97%   Breastfeeding Yes   BMI 26.69 kg/m   BP Readings from Last 3 Encounters:  02/24/24 120/88  01/29/24 114/66  01/29/24 114/60    Wt Readings from Last 3 Encounters:  02/24/24 160 lb 6.4 oz (72.8 kg)  01/29/24 160 lb (72.6 kg)  01/29/24 158 lb 9.6 oz (71.9 kg)    Physical Exam Cardiovascular:     Rate and Rhythm: Bradycardia present.     Heart sounds: Normal heart sounds, S1 normal and S2 normal.     Comments: EKG--- SB, 52 bpm No LVH, Q waves, or ST/T wave changes  Musculoskeletal:     Right lower leg: No edema.     Left lower leg: No edema.     Lab Results  Component Value Date   WBC 4.0 03/03/2024   HGB 13.0 03/03/2024   HCT 39.1 03/03/2024   PLT 214.0 03/03/2024   GLUCOSE 90 03/03/2024   CHOL 197 03/03/2024   TRIG 59.0 03/03/2024   HDL 64.00 03/03/2024   LDLCALC 121 (H) 03/03/2024   ALT 29 08/27/2023  AST 25 08/27/2023   NA 138 03/03/2024   K 4.1 03/03/2024   CL 103 03/03/2024   CREATININE 0.71 03/03/2024   BUN 19 03/03/2024   CO2 28 03/03/2024   TSH 3.91 03/03/2024    No results found.  Assessment & Plan:  Acquired hypothyroidism -     TSH; Future -     Basic metabolic panel with GFR; Future -     CBC with Differential/Platelet; Future -     hCG, quantitative, pregnancy; Future -     Levothyroxine  Sodium; Take 1 tablet (75 mcg total) by mouth every other day as needed.  Dispense: 45 tablet; Refill: 1 -     Levothyroxine  Sodium; Take 1 tablet (50 mcg total) by mouth every other day as needed.  Dispense: 45 tablet; Refill: 1  Encounter for general adult medical examination with abnormal  findings -     Lipid panel; Future  Amenorrhea -     hCG, quantitative, pregnancy; Future  Bradycardia -     EKG 12-Lead     Follow-up: Return in about 6 months (around 08/26/2024).  Brooke Crouch, MD

## 2024-03-03 ENCOUNTER — Ambulatory Visit: Payer: Self-pay | Admitting: Internal Medicine

## 2024-03-03 ENCOUNTER — Other Ambulatory Visit (INDEPENDENT_AMBULATORY_CARE_PROVIDER_SITE_OTHER)

## 2024-03-03 DIAGNOSIS — E039 Hypothyroidism, unspecified: Secondary | ICD-10-CM

## 2024-03-03 DIAGNOSIS — Z0001 Encounter for general adult medical examination with abnormal findings: Secondary | ICD-10-CM

## 2024-03-03 DIAGNOSIS — N912 Amenorrhea, unspecified: Secondary | ICD-10-CM | POA: Diagnosis not present

## 2024-03-03 LAB — CBC WITH DIFFERENTIAL/PLATELET
Basophils Absolute: 0 10*3/uL (ref 0.0–0.1)
Basophils Relative: 0.8 % (ref 0.0–3.0)
Eosinophils Absolute: 0.1 10*3/uL (ref 0.0–0.7)
Eosinophils Relative: 1.9 % (ref 0.0–5.0)
HCT: 39.1 % (ref 36.0–46.0)
Hemoglobin: 13 g/dL (ref 12.0–15.0)
Lymphocytes Relative: 31.6 % (ref 12.0–46.0)
Lymphs Abs: 1.2 10*3/uL (ref 0.7–4.0)
MCHC: 33.3 g/dL (ref 30.0–36.0)
MCV: 79.8 fl (ref 78.0–100.0)
Monocytes Absolute: 0.4 10*3/uL (ref 0.1–1.0)
Monocytes Relative: 9.4 % (ref 3.0–12.0)
Neutro Abs: 2.2 10*3/uL (ref 1.4–7.7)
Neutrophils Relative %: 56.3 % (ref 43.0–77.0)
Platelets: 214 10*3/uL (ref 150.0–400.0)
RBC: 4.9 Mil/uL (ref 3.87–5.11)
RDW: 13.4 % (ref 11.5–15.5)
WBC: 4 10*3/uL (ref 4.0–10.5)

## 2024-03-03 LAB — LIPID PANEL
Cholesterol: 197 mg/dL (ref 0–200)
HDL: 64 mg/dL (ref >39.00)
LDL Cholesterol: 121 mg/dL — ABNORMAL HIGH (ref 0–99)
NonHDL: 132.74
Total CHOL/HDL Ratio: 3
Triglycerides: 59 mg/dL (ref 0.0–149.0)
VLDL: 11.8 mg/dL (ref 0.0–40.0)

## 2024-03-03 LAB — BASIC METABOLIC PANEL WITH GFR
BUN: 19 mg/dL (ref 6–23)
CO2: 28 meq/L (ref 19–32)
Calcium: 9.5 mg/dL (ref 8.4–10.5)
Chloride: 103 meq/L (ref 96–112)
Creatinine, Ser: 0.71 mg/dL (ref 0.40–1.20)
GFR: 108.56 mL/min (ref >60.00)
Glucose, Bld: 90 mg/dL (ref 70–99)
Potassium: 4.1 meq/L (ref 3.5–5.1)
Sodium: 138 meq/L (ref 135–145)

## 2024-03-03 LAB — HCG, QUANTITATIVE, PREGNANCY: Quantitative HCG: <0.6 m[IU]/mL

## 2024-03-03 LAB — TSH: TSH: 3.91 u[IU]/mL (ref 0.35–5.50)

## 2024-03-03 MED ORDER — LEVOTHYROXINE SODIUM 75 MCG PO TABS
75.0000 ug | ORAL_TABLET | ORAL | 1 refills | Status: DC | PRN
  Filled 2024-03-03: qty 45, 90d supply, fill #0
  Filled 2024-06-21: qty 45, 90d supply, fill #1

## 2024-03-03 MED ORDER — LEVOTHYROXINE SODIUM 50 MCG PO TABS
50.0000 ug | ORAL_TABLET | ORAL | 1 refills | Status: DC | PRN
  Filled 2024-03-03: qty 45, 90d supply, fill #0
  Filled 2024-06-21: qty 45, 90d supply, fill #1

## 2024-03-04 ENCOUNTER — Other Ambulatory Visit (HOSPITAL_COMMUNITY): Payer: Self-pay

## 2024-04-06 DIAGNOSIS — F419 Anxiety disorder, unspecified: Secondary | ICD-10-CM | POA: Diagnosis not present

## 2024-04-12 DIAGNOSIS — F419 Anxiety disorder, unspecified: Secondary | ICD-10-CM | POA: Diagnosis not present

## 2024-04-26 ENCOUNTER — Encounter: Payer: Self-pay | Admitting: Internal Medicine

## 2024-04-29 ENCOUNTER — Ambulatory Visit: Admitting: Internal Medicine

## 2024-04-29 ENCOUNTER — Encounter: Payer: Self-pay | Admitting: Internal Medicine

## 2024-04-29 ENCOUNTER — Ambulatory Visit: Payer: Self-pay

## 2024-04-29 ENCOUNTER — Ambulatory Visit: Attending: Internal Medicine

## 2024-04-29 VITALS — BP 126/86 | HR 61 | Temp 98.4°F | Resp 16 | Ht 65.0 in | Wt 161.6 lb

## 2024-04-29 DIAGNOSIS — R002 Palpitations: Secondary | ICD-10-CM | POA: Diagnosis not present

## 2024-04-29 DIAGNOSIS — E039 Hypothyroidism, unspecified: Secondary | ICD-10-CM | POA: Diagnosis not present

## 2024-04-29 DIAGNOSIS — N912 Amenorrhea, unspecified: Secondary | ICD-10-CM | POA: Diagnosis not present

## 2024-04-29 LAB — HCG, QUANTITATIVE, PREGNANCY: Quantitative HCG: <0.6 m[IU]/mL

## 2024-04-29 NOTE — Progress Notes (Addendum)
 Subjective:  Patient ID: Brooke Sherman, female    DOB: 12-19-1985  Age: 38 y.o. MRN: [id number]  CC: Palpitations (Patient states that she has been having palpitations on and off for the last 2-3 weeks. She said the palpitations has been all over the place and she can actually feel them when they are having.  )   HPI Brooke Sherman presents for f/up ---  Discussed the use of AI scribe software for clinical note transcription with the patient, who gave verbal consent to proceed.  History of Present Illness Brooke Sherman is a 38 year old female who presents with palpitations.  She experiences palpitations characterized by 'extra beats' and occasional 'skipped beats.' These episodes occur randomly throughout the day, including while driving or nursing her daughter. About a week ago, she noticed an increase in frequency, occurring on two consecutive days and then skipping a day. She has had similar episodes in the past, but they have always been random.  No associated symptoms such as syncope, weakness, dizziness, lightheadedness, chest pain, or dyspnea. She engages in high-intensity exercise without experiencing palpitations during these activities. Her husband checked her pulse during an episode, and it was reportedly normal.  She is currently trying to conceive, with her last menstrual cycle on July 6. She is a few days away from her expected period and has not yet taken a pregnancy test. No early pregnancy symptoms such as nausea, although she has experienced some bloating recently.     ROS Review of Systems  Constitutional:  Negative for diaphoresis, fatigue and unexpected weight change.  HENT: Negative.    Eyes:  Negative for visual disturbance.  Respiratory:  Negative for cough, chest tightness, shortness of breath and wheezing.   Cardiovascular:  Positive for palpitations. Negative for chest pain and leg swelling.  Gastrointestinal:  Negative  for abdominal pain, constipation, diarrhea, nausea and vomiting.  Endocrine: Negative.  Negative for cold intolerance and heat intolerance.  Genitourinary:  Negative for difficulty urinating.  Musculoskeletal: Negative.  Negative for back pain.  Skin: Negative.   Neurological:  Negative for dizziness, syncope and light-headedness.  Hematological:  Negative for adenopathy. Does not bruise/bleed easily.  Psychiatric/Behavioral: Negative.      Objective:  BP 126/86 (BP Location: Left Arm, Patient Position: Sitting, Cuff Size: Normal)   Pulse 61   Temp 98.4 F (36.9 C) (Oral)   Resp 16   Ht 5' 5 (1.651 m)   Wt 161 lb 9.6 oz (73.3 kg)   LMP 04/04/2024   SpO2 99%   Breastfeeding Yes   BMI 26.89 kg/m   BP Readings from Last 3 Encounters:  04/29/24 126/86  02/24/24 120/88  01/29/24 114/66    Wt Readings from Last 3 Encounters:  04/29/24 161 lb 9.6 oz (73.3 kg)  02/24/24 160 lb 6.4 oz (72.8 kg)  01/29/24 160 lb (72.6 kg)    Physical Exam Vitals reviewed.  Constitutional:      Appearance: Normal appearance.  HENT:     Nose: Nose normal.     Mouth/Throat:     Mouth: Mucous membranes are moist.  Eyes:     General: No scleral icterus.    Conjunctiva/sclera: Conjunctivae normal.  Cardiovascular:     Rate and Rhythm: Normal rate and regular rhythm.     Heart sounds: Normal heart sounds, S1 normal and S2 normal. No murmur heard.    No friction rub. No gallop.     Comments: EKG--- NSR, 61 bpm No LVH,  Q waves, or ST/T wave changes  Pulmonary:     Effort: Pulmonary effort is normal.     Breath sounds: No stridor. No wheezing, rhonchi or rales.  Abdominal:     General: Abdomen is flat.     Palpations: There is no mass.     Tenderness: There is no abdominal tenderness. There is no guarding.     Hernia: No hernia is present.  Musculoskeletal:     Cervical back: Neck supple.     Right lower leg: No edema.     Left lower leg: No edema.  Lymphadenopathy:     Cervical: No  cervical adenopathy.  Skin:    General: Skin is warm and dry.  Neurological:     General: No focal deficit present.     Mental Status: She is alert.  Psychiatric:        Mood and Affect: Mood normal.        Behavior: Behavior normal.     Lab Results  Component Value Date   WBC 4.0 03/03/2024   HGB 13.0 03/03/2024   HCT 39.1 03/03/2024   PLT 214.0 03/03/2024   GLUCOSE 90 03/03/2024   CHOL 197 03/03/2024   TRIG 59.0 03/03/2024   HDL 64.00 03/03/2024   LDLCALC 121 (H) 03/03/2024   ALT 29 08/27/2023   AST 25 08/27/2023   NA 138 03/03/2024   K 4.1 03/03/2024   CL 103 03/03/2024   CREATININE 0.71 03/03/2024   BUN 19 03/03/2024   CO2 28 03/03/2024   TSH 2.01 04/29/2024    No results found.  Assessment & Plan:   Palpitations- Will evaluate for dysrhythmia. -     EKG 12-Lead -     Thyroid  Panel With TSH; Future -     LONG TERM MONITOR (3-14 DAYS); Future  Amenorrhea -     hCG, quantitative, pregnancy; Future  Acquired hypothyroidism- She is euthyroid. -     Thyroid  Panel With TSH; Future     Follow-up: Return in about 6 months (around 10/30/2024).  Debby Molt, MD

## 2024-04-29 NOTE — Progress Notes (Unsigned)
 EP to read.

## 2024-04-29 NOTE — Patient Instructions (Signed)
Palpitations Palpitations are feelings that your heartbeat is irregular or is faster than normal. It may feel like your heart is fluttering or skipping a beat. Palpitations may be caused by many things, including smoking, caffeine, alcohol, stress, and certain medicines or drugs. Most causes of palpitations are not serious.  However, some palpitations can be a sign of a serious problem. Further tests and a thorough medical history will be done to find the cause of your palpitations. Your provider may order tests such as an ECG, labs, an echocardiogram, or an ambulatory continuous ECG monitor. Follow these instructions at home: Pay attention to any changes in your symptoms. Let your health care provider know about them. Take these actions to help manage your symptoms: Eating and drinking Follow instructions from your health care provider about eating or drinking restrictions. You may need to avoid foods and drinks that may cause palpitations. These may include: Caffeinated coffee, tea, soft drinks, and energy drinks. Chocolate. Alcohol. Diet pills. Lifestyle     Take steps to reduce your stress and anxiety. Things that can help you relax include: Yoga. Mind-body activities, such as deep breathing, meditation, or using words and images to create positive thoughts (guided imagery). Physical activity, such as swimming, jogging, or walking. Tell your health care provider if your palpitations increase with activity. If you have chest pain or shortness of breath with activity, do not continue the activity until you are seen by your health care provider. Biofeedback. This is a method that helps you learn to use your mind to control things in your body, such as your heartbeat. Get plenty of rest and sleep. Keep a regular bed time. Do not use drugs, including cocaine or ecstasy. Do not use marijuana. Do not use any products that contain nicotine or tobacco. These products include cigarettes, chewing  tobacco, and vaping devices, such as e-cigarettes. If you need help quitting, ask your health care provider. General instructions Take over-the-counter and prescription medicines only as told by your health care provider. Keep all follow-up visits. This is important. These may include visits for further testing if palpitations do not go away or get worse. Contact a health care provider if: You continue to have a fast or irregular heartbeat for a long period of time. You notice that your palpitations occur more often. Get help right away if: You have chest pain or shortness of breath. You have a severe headache. You feel dizzy or you faint. These symptoms may represent a serious problem that is an emergency. Do not wait to see if the symptoms will go away. Get medical help right away. Call your local emergency services (911 in the [place] Do not drive yourself to the hospital. Summary Palpitations are feelings that your heartbeat is irregular or is faster than normal. It may feel like your heart is fluttering or skipping a beat. Palpitations may be caused by many things, including smoking, caffeine, alcohol, stress, certain medicines, and drugs. Further tests and a thorough medical history may be done to find the cause of your palpitations. Get help right away if you faint or have chest pain, shortness of breath, severe headache, or dizziness. This information is not intended to replace advice given to you by your health care provider. Make sure you discuss any questions you have with your health care provider. Document Revised: 02/07/2021 Document Reviewed: 02/07/2021 Elsevier Patient Education  2024 [employer].

## 2024-04-29 NOTE — Telephone Encounter (Signed)
    FYI Only or Action Required?: FYI only for provider.  Patient was last seen in primary care on 02/24/2024 by Joshua Debby CROME, MD.  Called Nurse Triage reporting Palpitations.  Symptoms began x couple of weeks.  Interventions attempted: Nothing.  Symptoms are: unchanged.  Triage Disposition: See Physician Within 24 Hours  Patient/caregiver understands and will follow disposition?: Yes    Copied from CRM [id number]. Topic: Clinical - Red Word Triage >> Apr 29, 2024  2:00 PM Robinson H wrote: [facility] that prompted transfer to Nurse Triage: Heart palpitations, tried to reach out to [facility] per notes to speak with nurse at office to schedule patient, nurse not available. Reason for Disposition  New-onset or worsening ankle swelling  Answer Assessment - Initial Assessment Questions 1. DESCRIPTION: Please describe your heart rate or heartbeat that you are having (e.g., fast/slow, regular/irregular, skipped or extra beats, palpitations)     Fast , skips beats 2. ONSET: When did it start? (e.g., minutes, hours, days)      X couple weeks 3. DURATION: How long does it last (e.g., seconds, minutes, hours)     na 4. PATTERN Does it come and go, or has it been constant since it started?  Does it get worse with exertion?   Are you feeling it now?     Comes and goes 5. TAP: Using your hand, can you tap out what you are feeling on a chair or table in front of you, so that I can hear? Note: Not all patients can do this.       na 6. HEART RATE: Can you tell me your heart rate? How many beats in 15 seconds?  Note: Not all patients can do this.       na 7. RECURRENT SYMPTOM: Have you ever had this before? If Yes, ask: When was the last time? and What happened that time?      Yes - nothing 8. CAUSE: What do you think is causing the palpitations?     unknown 9. CARDIAC HISTORY: Do you have any history of heart disease? (e.g., heart attack, angina, bypass surgery,  angioplasty, arrhythmia)      na 10. OTHER SYMPTOMS: Do you have any other symptoms? (e.g., dizziness, chest pain, sweating, difficulty breathing)       No  11. PREGNANCY: Is there any chance you are pregnant? When was your last menstrual period?       na  Protocols used: Heart Rate and Heartbeat Questions-A-AH

## 2024-04-29 NOTE — Telephone Encounter (Signed)
Patient has been seen in office today. 

## 2024-04-30 ENCOUNTER — Ambulatory Visit: Payer: Self-pay | Admitting: Internal Medicine

## 2024-04-30 LAB — THYROID PANEL WITH TSH
Free Thyroxine Index: 1.9 (ref 1.4–3.8)
T3 Uptake: 33 % (ref 22–35)
T4, Total: 5.8 ug/dL (ref 5.1–11.9)
TSH: 2.01 m[IU]/L

## 2024-06-02 DIAGNOSIS — R002 Palpitations: Secondary | ICD-10-CM | POA: Diagnosis not present

## 2024-06-04 DIAGNOSIS — R002 Palpitations: Secondary | ICD-10-CM

## 2024-06-07 ENCOUNTER — Other Ambulatory Visit: Payer: Self-pay | Admitting: Internal Medicine

## 2024-06-07 DIAGNOSIS — I471 Supraventricular tachycardia, unspecified: Secondary | ICD-10-CM | POA: Insufficient documentation

## 2024-07-12 ENCOUNTER — Ambulatory Visit (INDEPENDENT_AMBULATORY_CARE_PROVIDER_SITE_OTHER): Admitting: Obstetrics

## 2024-07-12 ENCOUNTER — Encounter: Payer: Self-pay | Admitting: Obstetrics

## 2024-07-12 ENCOUNTER — Other Ambulatory Visit (HOSPITAL_COMMUNITY)
Admission: RE | Admit: 2024-07-12 | Discharge: 2024-07-12 | Disposition: A | Discharge status: Home / Self Care | Source: Ambulatory Visit | Attending: Obstetrics | Admitting: Obstetrics

## 2024-07-12 VITALS — BP 113/71 | HR 55 | Ht 65.0 in | Wt 163.1 lb

## 2024-07-12 DIAGNOSIS — Z01419 Encounter for gynecological examination (general) (routine) without abnormal findings: Secondary | ICD-10-CM | POA: Diagnosis not present

## 2024-07-12 DIAGNOSIS — N83201 Unspecified ovarian cyst, right side: Secondary | ICD-10-CM

## 2024-07-12 NOTE — Progress Notes (Unsigned)
 Pt presents for infertility. Pt wants to know if breast feeding has anything to do with her not conceiving as quickly as she has in the past. Pt wants her TSH levels tested.

## 2024-07-12 NOTE — Progress Notes (Unsigned)
 Subjective:        Brooke Sherman is a 38 y.o. female here for a routine exam.  Current complaints: None.    Personal health questionnaire:  Is patient [name], have a family history of breast and/or ovarian cancer: no Is there a family history of uterine cancer diagnosed at age < 64, gastrointestinal cancer, urinary tract cancer, family member who is a Personnel officer syndrome-associated carrier: no Is the patient overweight and hypertensive, family history of diabetes, personal history of gestational diabetes, preeclampsia or PCOS: no Is patient over 15, have PCOS,  family history of premature CHD under age 49, diabetes, smoke, have hypertension or peripheral artery disease:  no At any time, has a partner hit, kicked or otherwise hurt or frightened you?: no Over the past 2 weeks, have you felt down, depressed or hopeless?: no Over the past 2 weeks, have you felt little interest or pleasure in doing things?:no   Gynecologic History Patient's last menstrual period was 07/05/2024 (exact date). Contraception: none Last Pap: 11-12-2022. Results were: normal Last mammogram: n/a. Results were: n/a  Obstetric History OB History  Gravida Para Term Preterm AB Living  2 2 2   2   SAB IAB Ectopic Multiple Live Births     0 2    # Outcome Date GA Lbr Len/2nd Weight Sex Type Anes PTL Lv  2 Term 05/22/23 [redacted]w[redacted]d 01:16 / 00:25 8 lb 2.5 oz (3.7 kg) F Vag-Spont Spinal, EPI  LIV     Birth Comments: wnl  1 Term 09/19/20 [redacted]w[redacted]d 08:00 / 04:24 7 lb 13.4 oz (3.555 kg) M Vag-Spont EPI  LIV    Past Medical History:  Diagnosis Date   Carpal tunnel syndrome    Hypothyroid    Raynaud's disease     Past Surgical History:  Procedure Laterality Date   ANTERIOR CRUCIATE LIGAMENT REPAIR Left 2019   WISDOM TOOTH EXTRACTION       Current Outpatient Medications:    levothyroxine  (SYNTHROID ) 50 MCG tablet, Take 1 tablet (50 mcg total) by mouth every other day as needed., Disp: 45 tablet, Rfl:  1   Prenatal Vit-Fe Fumarate-FA (PRENATAL PO), Take by mouth daily., Disp: , Rfl:    levothyroxine  (SYNTHROID ) 75 MCG tablet, Take 1 tablet (75 mcg total) by mouth every other day as needed., Disp: 45 tablet, Rfl: 1 No Known Allergies  Social History   Tobacco Use   Smoking status: Never    Passive exposure: Never   Smokeless tobacco: Never  Substance Use Topics   Alcohol use: Yes    Comment: occ    Family History  Problem Relation Age of Onset   Cervical cancer Mother    Healthy Son    Healthy Daughter       Review of Systems  Constitutional: negative for fatigue and weight loss Respiratory: negative for cough and wheezing Cardiovascular: negative for chest pain, fatigue and palpitations Gastrointestinal: negative for abdominal pain and change in bowel habits Musculoskeletal:negative for myalgias Neurological: negative for gait problems and tremors Behavioral/Psych: negative for abusive relationship, depression Endocrine: negative for temperature intolerance    Genitourinary:negative for abnormal menstrual periods, genital lesions, hot flashes, sexual problems and vaginal discharge Integument/breast: negative for breast lump, breast tenderness, nipple discharge and skin lesion(s)    Objective:       BP 113/71   Pulse (!) 55   Ht 5' 5 (1.651 m)   Wt 163 lb 1.6 oz (74 kg)   LMP 07/05/2024 (Exact Date)  BMI 27.14 kg/m  General:   alert  Skin:   no rash or abnormalities  Lungs:   clear to auscultation bilaterally  Heart:   regular rate and rhythm, S1, S2 normal, no murmur, click, rub or gallop  Breasts:   normal without suspicious masses, skin or nipple changes or axillary nodes  Abdomen:  normal findings: no organomegaly, soft, non-tender and no hernia  Pelvis:  External genitalia: normal general appearance Urinary system: urethral meatus normal and bladder without fullness, nontender Vaginal: normal without tenderness, induration or masses Cervix: normal  appearance Adnexa: normal bimanual exam Uterus: anteverted and non-tender, normal size   Lab Review Urine pregnancy test Labs reviewed yes Radiologic studies reviewed no  I have spent a total of 20 minutes of face-to-face time, excluding clinical staff time, reviewing notes and preparing to see patient, ordering tests and/or medications, and counseling the patient.   Assessment:    1. Encounter for gynecological examination with Papanicolaou smear of cervix (Primary) Rx: - Cytology - PAP( [place])  2. Cyst of right ovary Rx: - US  PELVIC COMPLETE WITH TRANSVAGINAL; Future     Plan:    Education reviewed: calcium supplements, depression evaluation, low fat, low cholesterol diet, safe sex/STD prevention, self breast exams, skin cancer screening, and weight bearing exercise. Follow up in: 1 year.    Orders Placed This Encounter  Procedures   US  PELVIC COMPLETE WITH TRANSVAGINAL    Standing Status:   Future    Expiration Date:   07/12/2025    Reason for Exam (SYMPTOM  OR DIAGNOSIS REQUIRED):   RIGHT OVARIAN CYST    Preferred imaging location?:   [facility]   CARLIN RONAL CENTERS, MD, FACOG Attending Obstetrician & Gynecologist, [facility] for [facility], [facility], [place] 07/12/2024

## 2024-07-14 DIAGNOSIS — Z3189 Encounter for other procreative management: Secondary | ICD-10-CM | POA: Diagnosis not present

## 2024-07-14 LAB — CYTOLOGY - PAP
Comment: NEGATIVE
Diagnosis: NEGATIVE
High risk HPV: NEGATIVE

## 2024-07-16 ENCOUNTER — Ambulatory Visit (HOSPITAL_BASED_OUTPATIENT_CLINIC_OR_DEPARTMENT_OTHER)
Admission: RE | Admit: 2024-07-16 | Discharge: 2024-07-16 | Disposition: A | Discharge status: Home / Self Care | Source: Ambulatory Visit | Attending: Obstetrics | Admitting: Obstetrics

## 2024-07-16 DIAGNOSIS — N854 Malposition of uterus: Secondary | ICD-10-CM | POA: Diagnosis not present

## 2024-07-16 DIAGNOSIS — N83201 Unspecified ovarian cyst, right side: Secondary | ICD-10-CM | POA: Insufficient documentation

## 2024-07-21 ENCOUNTER — Ambulatory Visit: Payer: 59 | Admitting: Rheumatology

## 2024-08-11 ENCOUNTER — Ambulatory Visit: Admitting: Internal Medicine

## 2024-08-14 ENCOUNTER — Other Ambulatory Visit (HOSPITAL_COMMUNITY): Payer: Self-pay

## 2024-08-14 ENCOUNTER — Other Ambulatory Visit: Payer: Self-pay | Admitting: Student in an Organized Health Care Education/Training Program

## 2024-08-14 DIAGNOSIS — J019 Acute sinusitis, unspecified: Secondary | ICD-10-CM

## 2024-08-14 MED ORDER — AZITHROMYCIN 250 MG PO TABS
ORAL_TABLET | ORAL | 0 refills | Status: AC
  Filled 2024-08-14: qty 6, 5d supply, fill #0

## 2024-09-06 DIAGNOSIS — D225 Melanocytic nevi of trunk: Secondary | ICD-10-CM | POA: Diagnosis not present

## 2024-09-14 ENCOUNTER — Ambulatory Visit: Attending: Cardiovascular Disease | Admitting: Cardiovascular Disease

## 2024-09-14 ENCOUNTER — Encounter: Payer: Self-pay | Admitting: Cardiovascular Disease

## 2024-09-14 VITALS — BP 108/60 | HR 55 | Ht 65.0 in | Wt 159.0 lb

## 2024-09-14 DIAGNOSIS — R002 Palpitations: Secondary | ICD-10-CM | POA: Diagnosis not present

## 2024-09-14 DIAGNOSIS — Z3189 Encounter for other procreative management: Secondary | ICD-10-CM | POA: Diagnosis not present

## 2024-09-14 NOTE — Patient Instructions (Signed)
 Medication Instructions:  .Your physician recommends that you continue on your current medications as directed. Please refer to the Current Medication list given to you today.  *If you need a refill on your cardiac medications before your next appointment, please call your pharmacy*  Lab Work: none If you have labs (blood work) drawn today and your tests are completely normal, you will receive your results only by: MyChart Message (if you have MyChart) OR A paper copy in the mail If you have any lab test that is abnormal or we need to change your treatment, we will call you to review the results.  Testing/Procedures: none  Follow-Up: At [facility], you and your health needs are our priority.  As part of our continuing mission to provide you with exceptional heart care, our providers are all part of one team.  This team includes your primary Cardiologist (physician) and Advanced Practice Providers or APPs (Physician Assistants and Nurse Practitioners) who all work together to provide you with the care you need, when you need it.  Your next appointment:   As needed  Provider:   Lonni Cash, MD    We recommend signing up for the patient portal called MyChart.  Sign up information is provided on this After Visit Summary.  MyChart is used to connect with patients for Virtual Visits (Telemedicine).  Patients are able to view lab/test results, encounter notes, upcoming appointments, etc.  Non-urgent messages can be sent to your provider as well.   To learn more about what you can do with MyChart, go to [email or web address].   Other Instructions

## 2024-09-14 NOTE — Progress Notes (Signed)
 Chief Complaint  Patient presents with   New Patient (Initial Visit)    Palpitations   History of Present Illness: 38 yo female with history of hypothyroidism and Raynaud's disease who is here today as a new consult, referred by Dr. Dr. Joshua, for the evaluation of palpitations. Cardiac monitor in July 2025 with sinus, PACs, PVCs and a 4 beat run of SVT. She tells me today that she had been having palpitations back in July but these resolved after she decreased her coffee intake. She had also been under stress with her two children (4 years and 17 months). Feeling well now. No chest pain, dyspnea, palpitations.   Primary Care Physician: Joshua Debby CROME, MD   Past Medical History:  Diagnosis Date   Carpal tunnel syndrome    Hypothyroid    Raynaud's disease    Past Surgical History:  Procedure Laterality Date   ANTERIOR CRUCIATE LIGAMENT REPAIR Left 2019   WISDOM TOOTH EXTRACTION      Current Outpatient Medications  Medication Sig Dispense Refill   COMIRNATY syringe Inject 0.3 mLs into the muscle once.     FLUCELVAX 0.5 ML injection Inject 0.5 mLs into the muscle once.     levothyroxine  (SYNTHROID ) 50 MCG tablet Take 1 tablet (50 mcg total) by mouth every other day as needed. 45 tablet 1   levothyroxine  (SYNTHROID ) 75 MCG tablet Take 1 tablet (75 mcg total) by mouth every other day as needed. 45 tablet 1   Prenatal Vit-Fe Fumarate-FA (PRENATAL PO) Take by mouth daily.     No current facility-administered medications for this visit.    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  Tobacco Use   Smoking status: Never    Passive exposure: Never   Smokeless tobacco: Never  Vaping Use   Vaping status: Never Used  Substance and Sexual  Activity   Alcohol use: Yes    Comment: occ   Drug use: Not Currently   Sexual activity: Yes    Partners: Male    Birth control/protection: None  Other Topics Concern   Not on file  Social History Narrative   Not on file   Social Drivers of Health   Tobacco Use: Low Risk (09/14/2024)   Patient History    Smoking Tobacco Use: Never    Smokeless Tobacco Use: Never    Passive Exposure: Never  Financial Resource Strain: Low Risk (02/24/2024)   Overall Financial Resource Strain (CARDIA)    Difficulty of Paying Living Expenses: Not hard at all  Food Insecurity: No Food Insecurity (02/24/2024)   Hunger Vital Sign    Worried About Running Out of Food in the Last Year: Never true    Ran Out of Food in the Last Year: Never true  Transportation Needs: No Transportation Needs (02/24/2024)   PRAPARE - Administrator, Civil Service (Medical): No    Lack of Transportation (Non-Medical): No  Physical Activity: Sufficiently Active (02/24/2024)   Exercise Vital Sign    Days of Exercise per Week: 5 days    Minutes of Exercise per Session: 30 min  Stress: No Stress Concern Present (02/24/2024)   [employer] of Occupational Health - Occupational Stress Questionnaire    Feeling  of Stress : Not at all  Social Connections: Socially Integrated (02/24/2024)   Social Connection and Isolation Panel    Frequency of Communication with Friends and Family: More than three times a week    Frequency of Social Gatherings with Friends and Family: More than three times a week    Attends Religious Services: More than 4 times per year    Active Member of [employer] or Organizations: Yes    Attends Banker Meetings: More than 4 times per year    Marital Status: Married  Catering Manager Violence: Not At Risk (05/22/2023)   Humiliation, Afraid, Rape, and Kick questionnaire    Fear of Current or Ex-Partner: No    Emotionally Abused: No    Physically Abused: No    Sexually Abused: No   Depression (PHQ2-9): Low Risk (02/24/2024)   Depression (PHQ2-9)    PHQ-2 Score: 0  Alcohol Screen: Low Risk (02/24/2024)   Alcohol Screen    Last Alcohol Screening Score (AUDIT): 2  Housing: Low Risk (02/24/2024)   Housing Stability Vital Sign    Unable to Pay for Housing in the Last Year: No    Number of Times Moved in the Last Year: 0    Homeless in the Last Year: No  Utilities: Not At Risk (05/22/2023)   AHC Utilities    Threatened with loss of utilities: No  Health Literacy: Not on file    Family History  Problem Relation Age of Onset   Cervical cancer Mother    Healthy Son    Healthy Daughter     Review of Systems:  As stated in the HPI and otherwise negative.   BP 108/60   Pulse (!) 55   Ht 5' 5 (1.651 m)   Wt 159 lb (72.1 kg)   SpO2 98%   BMI 26.46 kg/m   Physical Examination: General: Well developed, well nourished, NAD  HEENT: OP clear, mucus membranes moist  SKIN: warm, dry. No rashes. Neuro: No focal deficits  Musculoskeletal: Muscle strength 5/5 all ext  Psychiatric: Mood and affect normal  Neck: No JVD, no carotid bruits, no thyromegaly, no lymphadenopathy.  Lungs:Clear bilaterally, no wheezes, rhonci, crackles Cardiovascular: Regular rate and rhythm. No murmurs, gallops or rubs. Abdomen:Soft. Bowel sounds present. Non-tender.  Extremities: No lower extremity edema. Pulses are 2 + in the bilateral DP/PT.  EKG:  EKG is ordered today. The ekg ordered today demonstrates  EKG Interpretation Date/Time:  Tuesday September 14 2024 09:22:51 EST Ventricular Rate:  55 PR Interval:  162 QRS Duration:  80 QT Interval:  426 QTC Calculation: 407 R Axis:   84  Text Interpretation: Sinus bradycardia No previous ECGs available Confirmed by Verlin Bruckner [phone]) on 09/14/2024 9:24:36 AM   Recent Labs: 03/03/2024: BUN 19; Creatinine, Ser 0.71; Hemoglobin 13.0; Platelets 214.0; Potassium 4.1; Sodium 138 04/29/2024: TSH 2.01   Lipid Panel    Component  Value Date/Time   CHOL 197 03/03/2024 0751   TRIG 59.0 03/03/2024 0751   HDL 64.00 03/03/2024 0751   CHOLHDL 3 03/03/2024 0751   VLDL 11.8 03/03/2024 0751   LDLCALC 121 (H) 03/03/2024 0751     Wt Readings from Last 3 Encounters:  09/14/24 159 lb (72.1 kg)  07/12/24 163 lb 1.6 oz (74 kg)  04/29/24 161 lb 9.6 oz (73.3 kg)    Assessment and Plan:   1. Palpitations/SVT: No recent palpitations. No further workup at this time. EKG is overall normal today. Cardiac exam is normal.  Labs/ tests ordered today include:  Orders Placed This Encounter  Procedures   EKG 12-Lead   Disposition:   F/U with me as needed.   Signed, Lonni Cash, MD, [facility] 09/14/2024 9:56 AM    [facility] [address] [place], [place], [place]  [address] Phone: [phone]; Fax: [phone]       [1] No Known Allergies

## 2024-09-21 ENCOUNTER — Other Ambulatory Visit: Payer: Self-pay

## 2024-09-21 ENCOUNTER — Other Ambulatory Visit: Payer: Self-pay | Admitting: Internal Medicine

## 2024-09-21 ENCOUNTER — Other Ambulatory Visit (HOSPITAL_COMMUNITY): Payer: Self-pay

## 2024-09-21 DIAGNOSIS — E039 Hypothyroidism, unspecified: Secondary | ICD-10-CM

## 2024-09-21 MED ORDER — LEVOTHYROXINE SODIUM 50 MCG PO TABS
50.0000 ug | ORAL_TABLET | ORAL | 1 refills | Status: AC | PRN
  Filled 2024-09-21: qty 45, 90d supply, fill #0

## 2024-09-21 MED ORDER — LEVOTHYROXINE SODIUM 75 MCG PO TABS
75.0000 ug | ORAL_TABLET | ORAL | 1 refills | Status: AC | PRN
  Filled 2024-09-21: qty 45, 90d supply, fill #0

## 2024-11-24 ENCOUNTER — Ambulatory Visit: Payer: 59 | Admitting: Rheumatology
